# Patient Record
Sex: Female | Born: 1970
Health system: Southern US, Community
[De-identification: ages and names within clinical notes are randomized; demographics above are authoritative.]

## PROBLEM LIST (undated history)

## (undated) ENCOUNTER — Inpatient Hospital Stay: Admission: EM | Payer: Self-pay | Source: Home / Self Care

## (undated) DIAGNOSIS — M797 Fibromyalgia: Secondary | ICD-10-CM

## (undated) DIAGNOSIS — F419 Anxiety disorder, unspecified: Secondary | ICD-10-CM

## (undated) DIAGNOSIS — J42 Unspecified chronic bronchitis: Secondary | ICD-10-CM

## (undated) DIAGNOSIS — B0223 Postherpetic polyneuropathy: Secondary | ICD-10-CM

## (undated) DIAGNOSIS — T783XXA Angioneurotic edema, initial encounter: Secondary | ICD-10-CM

## (undated) DIAGNOSIS — F4310 Post-traumatic stress disorder, unspecified: Secondary | ICD-10-CM

## (undated) DIAGNOSIS — F319 Bipolar disorder, unspecified: Secondary | ICD-10-CM

## (undated) DIAGNOSIS — E162 Hypoglycemia, unspecified: Secondary | ICD-10-CM

## (undated) DIAGNOSIS — E119 Type 2 diabetes mellitus without complications: Secondary | ICD-10-CM

## (undated) DIAGNOSIS — N3010 Interstitial cystitis (chronic) without hematuria: Secondary | ICD-10-CM

## (undated) DIAGNOSIS — J449 Chronic obstructive pulmonary disease, unspecified: Secondary | ICD-10-CM

## (undated) DIAGNOSIS — F250 Schizoaffective disorder, bipolar type: Secondary | ICD-10-CM

## (undated) DIAGNOSIS — M069 Rheumatoid arthritis, unspecified: Secondary | ICD-10-CM

## (undated) DIAGNOSIS — M1990 Unspecified osteoarthritis, unspecified site: Secondary | ICD-10-CM

## (undated) DIAGNOSIS — F429 Obsessive-compulsive disorder, unspecified: Secondary | ICD-10-CM

## (undated) DIAGNOSIS — E282 Polycystic ovarian syndrome: Secondary | ICD-10-CM

## (undated) DIAGNOSIS — I639 Cerebral infarction, unspecified: Secondary | ICD-10-CM

## (undated) DIAGNOSIS — F603 Borderline personality disorder: Secondary | ICD-10-CM

## (undated) DIAGNOSIS — N644 Mastodynia: Secondary | ICD-10-CM

## (undated) HISTORY — DX: Borderline personality disorder: F60.3

## (undated) HISTORY — DX: Rheumatoid arthritis, unspecified: M06.9

## (undated) HISTORY — DX: Anxiety disorder, unspecified: F41.9

## (undated) HISTORY — DX: Unspecified osteoarthritis, unspecified site: M19.90

## (undated) HISTORY — DX: Schizoaffective disorder, bipolar type: F25.0

## (undated) HISTORY — DX: Post-traumatic stress disorder, unspecified: F43.10

## (undated) HISTORY — DX: Cerebral infarction, unspecified: I63.9

## (undated) HISTORY — DX: Hypoglycemia, unspecified: E16.2

## (undated) HISTORY — DX: Obsessive-compulsive disorder, unspecified: F42.9

## (undated) HISTORY — DX: Postherpetic polyneuropathy: B02.23

## (undated) HISTORY — DX: Angioneurotic edema, initial encounter: T78.3XXA

## (undated) HISTORY — DX: Bipolar disorder, unspecified: F31.9

## (undated) HISTORY — DX: Chronic obstructive pulmonary disease, unspecified: J44.9

## (undated) HISTORY — DX: Unspecified chronic bronchitis: J42

---
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
## 1993-09-07 HISTORY — PX: OOPHORECTOMY: SHX86

## 1997-06-07 HISTORY — PX: ABDOMINAL HYSTERECTOMY: SHX81

## 2009-11-06 ENCOUNTER — Emergency Department (HOSPITAL_COMMUNITY): Admission: EM | Admit: 2009-11-06 | Discharge: 2009-11-06 | Payer: Self-pay | Admitting: Emergency Medicine

## 2009-11-06 ENCOUNTER — Emergency Department (HOSPITAL_COMMUNITY): Admission: EM | Admit: 2009-11-06 | Discharge: 2009-11-06 | Payer: Self-pay | Admitting: Family Medicine

## 2009-11-08 ENCOUNTER — Emergency Department (HOSPITAL_COMMUNITY): Admission: EM | Admit: 2009-11-08 | Discharge: 2009-11-08 | Payer: Self-pay | Admitting: Emergency Medicine

## 2009-12-18 ENCOUNTER — Ambulatory Visit: Payer: Self-pay | Admitting: Pain Medicine

## 2010-01-19 ENCOUNTER — Emergency Department (HOSPITAL_COMMUNITY)
Admission: EM | Admit: 2010-01-19 | Discharge: 2010-01-19 | Payer: Self-pay | Source: Home / Self Care | Admitting: Emergency Medicine

## 2010-01-20 ENCOUNTER — Emergency Department (HOSPITAL_COMMUNITY): Admission: EM | Admit: 2010-01-20 | Discharge: 2010-01-20 | Payer: Self-pay | Admitting: Emergency Medicine

## 2010-02-17 ENCOUNTER — Emergency Department: Payer: Self-pay | Admitting: Emergency Medicine

## 2010-05-27 ENCOUNTER — Inpatient Hospital Stay: Payer: Self-pay | Admitting: Psychiatry

## 2010-06-06 ENCOUNTER — Emergency Department (HOSPITAL_COMMUNITY): Admission: EM | Admit: 2010-06-06 | Discharge: 2010-06-06 | Payer: Self-pay | Admitting: Emergency Medicine

## 2010-07-17 ENCOUNTER — Other Ambulatory Visit: Payer: Self-pay

## 2010-08-18 ENCOUNTER — Emergency Department: Payer: Self-pay | Admitting: Unknown Physician Specialty

## 2010-08-21 ENCOUNTER — Ambulatory Visit (HOSPITAL_COMMUNITY)
Admission: RE | Admit: 2010-08-21 | Discharge: 2010-08-21 | Payer: Self-pay | Source: Home / Self Care | Attending: Gastroenterology | Admitting: Gastroenterology

## 2010-09-28 ENCOUNTER — Encounter: Payer: Self-pay | Admitting: Gastroenterology

## 2010-11-06 HISTORY — PX: GANGLION CYST EXCISION: SHX1691

## 2010-11-20 LAB — RAPID URINE DRUG SCREEN, HOSP PERFORMED
Cocaine: NOT DETECTED
Opiates: NOT DETECTED
Tetrahydrocannabinol: NOT DETECTED

## 2010-11-20 LAB — COMPREHENSIVE METABOLIC PANEL
ALT: 55 U/L — ABNORMAL HIGH (ref 0–35)
AST: 36 U/L (ref 0–37)
Albumin: 3.7 g/dL (ref 3.5–5.2)
CO2: 26 mEq/L (ref 19–32)
Chloride: 109 mEq/L (ref 96–112)
GFR calc Af Amer: >60 mL/min (ref >60)
GFR calc non Af Amer: >60 mL/min (ref >60)
Potassium: 3.9 mEq/L (ref 3.5–5.1)
Sodium: 141 mEq/L (ref 135–145)
Total Bilirubin: 0.4 mg/dL (ref 0.3–1.2)

## 2010-11-20 LAB — DIFFERENTIAL
Basophils Absolute: 0 10*3/uL (ref 0.0–0.1)
Basophils Relative: 0 % (ref 0–1)
Eosinophils Absolute: 0.3 10*3/uL (ref 0.0–0.7)
Eosinophils Relative: 5 % (ref 0–5)
Lymphocytes Relative: 48 % — ABNORMAL HIGH (ref 12–46)
Lymphs Abs: 3.2 10*3/uL (ref 0.7–4.0)
Monocytes Absolute: 0.6 10*3/uL (ref 0.1–1.0)
Monocytes Relative: 9 % (ref 3–12)
Neutro Abs: 2.6 10*3/uL (ref 1.7–7.7)
Neutrophils Relative %: 38 % — ABNORMAL LOW (ref 43–77)

## 2010-11-20 LAB — CBC
HCT: 42.1 % (ref 36.0–46.0)
Hemoglobin: 14.1 g/dL (ref 12.0–15.0)
MCH: 31.1 pg (ref 26.0–34.0)
MCHC: 33.5 g/dL (ref 30.0–36.0)
MCV: 92.9 fL (ref 78.0–100.0)
Platelets: 192 10*3/uL (ref 150–400)
RBC: 4.53 MIL/uL (ref 3.87–5.11)
RDW: 12.9 % (ref 11.5–15.5)
WBC: 6.7 10*3/uL (ref 4.0–10.5)

## 2010-11-20 LAB — ETHANOL: Alcohol, Ethyl (B): <5 mg/dL (ref 0–10)

## 2010-12-01 LAB — COMPREHENSIVE METABOLIC PANEL
Albumin: 4 g/dL (ref 3.5–5.2)
BUN: 8 mg/dL (ref 6–23)
Calcium: 9.2 mg/dL (ref 8.4–10.5)
Creatinine, Ser: 0.67 mg/dL (ref 0.4–1.2)
Potassium: 3.6 mEq/L (ref 3.5–5.1)
Total Protein: 8.2 g/dL (ref 6.0–8.3)

## 2010-12-01 LAB — DIFFERENTIAL
Basophils Absolute: 0 10*3/uL (ref 0.0–0.1)
Basophils Relative: 0 % (ref 0–1)
Eosinophils Absolute: 0.2 10*3/uL (ref 0.0–0.7)
Eosinophils Relative: 3 % (ref 0–5)
Lymphocytes Relative: 45 % (ref 12–46)
Lymphs Abs: 2.6 10*3/uL (ref 0.7–4.0)
Monocytes Absolute: 0.6 10*3/uL (ref 0.1–1.0)
Monocytes Absolute: 0.6 10*3/uL (ref 0.1–1.0)
Monocytes Relative: 10 % (ref 3–12)
Monocytes Relative: 9 % (ref 3–12)
Neutro Abs: 2.4 10*3/uL (ref 1.7–7.7)
Neutro Abs: 3.2 10*3/uL (ref 1.7–7.7)

## 2010-12-01 LAB — URINALYSIS, ROUTINE W REFLEX MICROSCOPIC
Bilirubin Urine: NEGATIVE
Hgb urine dipstick: NEGATIVE
Hgb urine dipstick: NEGATIVE
Specific Gravity, Urine: 1.021 (ref 1.005–1.030)
Specific Gravity, Urine: 1.022 (ref 1.005–1.030)
Urobilinogen, UA: 0.2 mg/dL (ref 0.0–1.0)
pH: 7 (ref 5.0–8.0)

## 2010-12-01 LAB — POCT URINALYSIS DIP (DEVICE)
Protein, ur: 100 mg/dL — AB
pH: 7 (ref 5.0–8.0)

## 2010-12-01 LAB — POCT I-STAT, CHEM 8
Calcium, Ion: 1.13 mmol/L (ref 1.12–1.32)
Glucose, Bld: 86 mg/dL (ref 70–99)
HCT: 45 % (ref 36.0–46.0)
Hemoglobin: 15.3 g/dL — ABNORMAL HIGH (ref 12.0–15.0)
TCO2: 24 mmol/L (ref 0–100)

## 2010-12-01 LAB — CBC
HCT: 43.9 % (ref 36.0–46.0)
HCT: 45.3 % (ref 36.0–46.0)
Hemoglobin: 15.2 g/dL — ABNORMAL HIGH (ref 12.0–15.0)
MCHC: 34.5 g/dL (ref 30.0–36.0)
MCV: 91.8 fL (ref 78.0–100.0)
MCV: 92.3 fL (ref 78.0–100.0)
Platelets: 207 10*3/uL (ref 150–400)
RBC: 4.79 MIL/uL (ref 3.87–5.11)
RDW: 12.4 % (ref 11.5–15.5)
RDW: 12.8 % (ref 11.5–15.5)

## 2010-12-01 LAB — URINE MICROSCOPIC-ADD ON

## 2010-12-01 LAB — WET PREP, GENITAL: Clue Cells Wet Prep HPF POC: NONE SEEN

## 2010-12-01 LAB — URINE CULTURE: Culture: NO GROWTH

## 2010-12-01 LAB — GC/CHLAMYDIA PROBE AMP, GENITAL
Chlamydia, DNA Probe: NEGATIVE
GC Probe Amp, Genital: NEGATIVE

## 2011-02-27 ENCOUNTER — Other Ambulatory Visit: Payer: Self-pay

## 2011-08-21 DIAGNOSIS — T783XXA Angioneurotic edema, initial encounter: Secondary | ICD-10-CM | POA: Insufficient documentation

## 2013-08-29 ENCOUNTER — Ambulatory Visit: Payer: Self-pay | Admitting: Family Medicine

## 2013-10-16 DIAGNOSIS — K769 Liver disease, unspecified: Secondary | ICD-10-CM | POA: Insufficient documentation

## 2013-12-14 ENCOUNTER — Ambulatory Visit: Payer: Self-pay

## 2014-03-06 DIAGNOSIS — J45909 Unspecified asthma, uncomplicated: Secondary | ICD-10-CM | POA: Insufficient documentation

## 2014-03-06 DIAGNOSIS — E669 Obesity, unspecified: Secondary | ICD-10-CM | POA: Insufficient documentation

## 2014-04-03 DIAGNOSIS — E785 Hyperlipidemia, unspecified: Secondary | ICD-10-CM | POA: Insufficient documentation

## 2014-04-03 DIAGNOSIS — K760 Fatty (change of) liver, not elsewhere classified: Secondary | ICD-10-CM | POA: Insufficient documentation

## 2014-04-03 DIAGNOSIS — E119 Type 2 diabetes mellitus without complications: Secondary | ICD-10-CM | POA: Insufficient documentation

## 2014-04-03 DIAGNOSIS — E039 Hypothyroidism, unspecified: Secondary | ICD-10-CM | POA: Insufficient documentation

## 2014-04-03 DIAGNOSIS — J309 Allergic rhinitis, unspecified: Secondary | ICD-10-CM | POA: Insufficient documentation

## 2014-08-01 ENCOUNTER — Ambulatory Visit: Payer: Self-pay | Admitting: Family Medicine

## 2014-08-01 DIAGNOSIS — N951 Menopausal and female climacteric states: Secondary | ICD-10-CM | POA: Insufficient documentation

## 2014-08-01 DIAGNOSIS — B372 Candidiasis of skin and nail: Secondary | ICD-10-CM | POA: Insufficient documentation

## 2015-08-17 ENCOUNTER — Emergency Department
Admission: EM | Admit: 2015-08-17 | Discharge: 2015-08-17 | Disposition: A | Discharge status: Home / Self Care | Payer: Medicare Other | Attending: Emergency Medicine | Admitting: Emergency Medicine

## 2015-08-17 ENCOUNTER — Encounter: Payer: Self-pay | Admitting: Emergency Medicine

## 2015-08-17 DIAGNOSIS — T65891A Toxic effect of other specified substances, accidental (unintentional), initial encounter: Secondary | ICD-10-CM | POA: Diagnosis not present

## 2015-08-17 DIAGNOSIS — Y998 Other external cause status: Secondary | ICD-10-CM | POA: Diagnosis not present

## 2015-08-17 DIAGNOSIS — Y9389 Activity, other specified: Secondary | ICD-10-CM | POA: Diagnosis not present

## 2015-08-17 DIAGNOSIS — T7840XA Allergy, unspecified, initial encounter: Secondary | ICD-10-CM | POA: Diagnosis present

## 2015-08-17 DIAGNOSIS — Y92512 Supermarket, store or market as the place of occurrence of the external cause: Secondary | ICD-10-CM | POA: Insufficient documentation

## 2015-08-17 HISTORY — DX: Type 2 diabetes mellitus without complications: E11.9

## 2015-08-17 HISTORY — DX: Polycystic ovarian syndrome: E28.2

## 2015-08-17 HISTORY — DX: Fibromyalgia: M79.7

## 2015-08-17 MED ORDER — METHYLPREDNISOLONE SODIUM SUCC 125 MG IJ SOLR
INTRAMUSCULAR | Status: AC
  Administered 2015-08-17: 125 mg via INTRAVENOUS
  Filled 2015-08-17: qty 2

## 2015-08-17 MED ORDER — EPINEPHRINE 0.3 MG/0.3ML IJ SOAJ: 0.3000 mg | Freq: Once | INTRAMUSCULAR | Status: DC

## 2015-08-17 MED ORDER — SODIUM CHLORIDE 0.9 % IV BOLUS (SEPSIS)
1000.0000 mL | Freq: Once | INTRAVENOUS | Status: AC
  Administered 2015-08-17: 1000 mL via INTRAVENOUS

## 2015-08-17 MED ORDER — METHYLPREDNISOLONE SODIUM SUCC 125 MG IJ SOLR
125.0000 mg | Freq: Once | INTRAMUSCULAR | Status: AC
  Administered 2015-08-17: 125 mg via INTRAVENOUS

## 2015-08-17 MED ORDER — PREDNISONE 20 MG PO TABS: 40.0000 mg | ORAL_TABLET | Freq: Every day | ORAL | Status: DC

## 2015-08-17 NOTE — Discharge Instructions (Signed)
Please seek medical attention for any high fevers, chest pain, shortness of breath, change in behavior, persistent vomiting, bloody stool or any other new or concerning symptoms. ° ° °Allergies °An allergy is an abnormal reaction to a substance by the body's defense system (immune system). Allergies can develop at any age. °WHAT CAUSES ALLERGIES? °An allergic reaction happens when the immune system mistakenly reacts to a normally harmless substance, called an allergen, as if it were harmful. The immune system releases antibodies to fight the substance. Antibodies eventually release a chemical called histamine into the bloodstream. The release of histamine is meant to protect the body from infection, but it also causes discomfort. °An allergic reaction can be triggered by: °· Eating an allergen. °· Inhaling an allergen. °· Touching an allergen. °WHAT TYPES OF ALLERGIES ARE THERE? °There are many types of allergies. Common types include: °· Seasonal allergies. People with this type of allergy are usually allergic to substances that are only present during certain seasons, such as molds and pollens. °· Food allergies. °· Drug allergies. °· Insect allergies. °· Animal dander allergies. °WHAT ARE SYMPTOMS OF ALLERGIES? °Possible allergy symptoms include: °· Swelling of the lips, face, tongue, mouth, or throat. °· Sneezing, coughing, or wheezing. °· Nasal congestion. °· Tingling in the mouth. °· Rash. °· Itching. °· Itchy, red, swollen areas of skin (hives). °· Watery eyes. °· Vomiting. °· Diarrhea. °· Dizziness. °· Lightheadedness. °· Fainting. °· Trouble breathing or swallowing. °· Chest tightness. °· Rapid heartbeat. °HOW ARE ALLERGIES DIAGNOSED? °Allergies are diagnosed with a medical and family history and one or more of the following: °· Skin tests. °· Blood tests. °· A food diary. A food diary is a record of all the foods and drinks you have in a day and of all the symptoms you experience. °· The results of an  elimination diet. An elimination diet involves eliminating foods from your diet and then adding them back in one by one to find out if a certain food causes an allergic reaction. °HOW ARE ALLERGIES TREATED? °There is no cure for allergies, but allergic reactions can be treated with medicine. Severe reactions usually need to be treated at a hospital. °HOW CAN REACTIONS BE PREVENTED? °The best way to prevent an allergic reaction is by avoiding the substance you are allergic to. Allergy shots and medicines can also help prevent reactions in some cases. People with severe allergic reactions may be able to prevent a life-threatening reaction called anaphylaxis with a medicine given right after exposure to the allergen. °  °This information is not intended to replace advice given to you by your health care provider. Make sure you discuss any questions you have with your health care provider. °  °Document Released: 11/17/2002 Document Revised: 09/14/2014 Document Reviewed: 06/05/2014 °Elsevier Interactive Patient Education ©2016 [employer] ° °

## 2015-08-17 NOTE — ED Provider Notes (Signed)
[facility] Emergency Department Provider Note   ____________________________________________  Time seen: 1715  I have reviewed the triage vital signs and the nursing notes.   HISTORY  Chief Complaint Allergic Reaction   History limited by: Not Limited   HPI Stacy Daniel is a 44 y.o. female who presents to the emergency department today because of concerns for allergic reaction. The patient states that she has allergies to multiple allergens. She states she went to [place] today and in the checkout line started having a reaction. She thinks this was due to a fellow shopper. She started having swelling to her upper back which she states is the first symptom she gets with her allergic reactions. She then took 2 oral Benadryl and gave herself an EpiPen. She feels like she is now gotten better. She states that she has been followed by an allergist.  No past medical history on file.  There are no active problems to display for this patient.   No past surgical history on file.  No current outpatient prescriptions on file.  Allergies Review of patient's allergies indicates not on file.  No family history on file.  Social History Social History  Substance Use Topics  . Smoking status: Never Smoker   . Smokeless tobacco: Not on file  . Alcohol Use: No    Review of Systems  Constitutional: Negative for fever. Cardiovascular: Negative for chest pain. Respiratory: Negative for shortness of breath. Gastrointestinal: Negative for abdominal pain, vomiting and diarrhea. Genitourinary: Negative for dysuria. Musculoskeletal: Negative for back pain. Skin: Negative for rash. Neurological: Negative for headaches, focal weakness or numbness.  10-point ROS otherwise negative.  ____________________________________________   PHYSICAL EXAM:  VITAL SIGNS: ED Triage Vitals  Enc Vitals Group     BP 08/17/15 1653 138/87 mmHg     Pulse Rate 08/17/15 1653 94      Resp 08/17/15 1653 18     Temp 08/17/15 1653 98.6 F (37 C)     Temp Source 08/17/15 1653 Oral     SpO2 08/17/15 1653 99 %     Weight 08/17/15 1653 185 lb (83.915 kg)     Height 08/17/15 1653 5\' 3"  (1.6 m)   Constitutional: Alert and oriented. Well appearing and in no distress. Eyes: Conjunctivae are normal. PERRL. Normal extraocular movements. ENT   Head: Normocephalic and atraumatic.   Nose: No congestion/rhinnorhea.   Mouth/Throat: Mucous membranes are moist.   Neck: No stridor. Hematological/Lymphatic/Immunilogical: No cervical lymphadenopathy. Cardiovascular: Normal rate, regular rhythm.  No murmurs, rubs, or gallops. Respiratory: Normal respiratory effort without tachypnea nor retractions. Breath sounds are clear and equal bilaterally. No wheezes/rales/rhonchi. Gastrointestinal: Soft and nontender. No distention. There is no CVA tenderness. Genitourinary: Deferred Musculoskeletal: Normal range of motion in all extremities. No joint effusions.  No lower extremity tenderness nor edema. Neurologic:  Normal speech and language. No gross focal neurologic deficits are appreciated.  Skin:  Skin is warm, dry and intact. No rash noted. Psychiatric: Mood and affect are normal. Speech and behavior are normal. Patient exhibits appropriate insight and judgment.  ____________________________________________    LABS (pertinent positives/negatives)  None  ____________________________________________   EKG  None  ____________________________________________    RADIOLOGY  None   ____________________________________________   PROCEDURES  Procedure(s) performed: None  Critical Care performed: No  ____________________________________________   INITIAL IMPRESSION / ASSESSMENT AND PLAN / ED COURSE  Pertinent labs & imaging results that were available during my care of the patient were reviewed by me and considered  in my medical decision making (see chart  for details).  Patient presents to the emergency department today after concern for allergic reaction. The patient did give herself an EpiPen. On presentation here patient is no distress. No obvious signs of allergic reaction. I did observe the patient here in the emergency department for a number of hours without any concerning symptoms. I will discharge home with a refill prescription for EpiPen as well as a prescription for prednisone.  ____________________________________________   FINAL CLINICAL IMPRESSION(S) / ED DIAGNOSES  Final diagnoses:  Allergic reaction, initial encounter     Nance Pear, MD 08/17/15 2127

## 2015-08-17 NOTE — ED Notes (Signed)
Pt to ED from [facility] via EMS c/o allergic reaction.  Pt states left side of tongue feeling swollen, back of neck started swelling which patient says is normal reaction for her to allergies.  Pt states hx of allergic reactions but never having being intubated.  Pt states difficulty breathing, presents with dry cough.  Pt states was checking out when someone passed by with something fragrant and patient started feeling symptoms.  Pt administered 2 PO benadryl and self injected EPI.  Pt is A&Ox4, speaking in complete and coherent sentences at this time.

## 2015-09-11 DIAGNOSIS — R2689 Other abnormalities of gait and mobility: Secondary | ICD-10-CM | POA: Diagnosis not present

## 2015-09-11 DIAGNOSIS — G4710 Hypersomnia, unspecified: Secondary | ICD-10-CM | POA: Diagnosis not present

## 2015-09-11 DIAGNOSIS — M797 Fibromyalgia: Secondary | ICD-10-CM | POA: Diagnosis not present

## 2015-09-11 DIAGNOSIS — G894 Chronic pain syndrome: Secondary | ICD-10-CM | POA: Diagnosis not present

## 2015-10-09 DIAGNOSIS — M797 Fibromyalgia: Secondary | ICD-10-CM | POA: Diagnosis not present

## 2015-10-10 ENCOUNTER — Encounter: Payer: Self-pay | Admitting: Obstetrics and Gynecology

## 2015-10-11 ENCOUNTER — Ambulatory Visit (INDEPENDENT_AMBULATORY_CARE_PROVIDER_SITE_OTHER): Payer: PPO | Admitting: Urology

## 2015-10-11 ENCOUNTER — Encounter: Payer: Self-pay | Admitting: Urology

## 2015-10-11 VITALS — BP 115/80 | HR 105 | Ht 63.0 in | Wt 212.0 lb

## 2015-10-11 DIAGNOSIS — N941 Unspecified dyspareunia: Secondary | ICD-10-CM | POA: Diagnosis not present

## 2015-10-11 DIAGNOSIS — N3010 Interstitial cystitis (chronic) without hematuria: Secondary | ICD-10-CM | POA: Diagnosis not present

## 2015-10-11 LAB — URINALYSIS, COMPLETE
Bilirubin, UA: NEGATIVE
Glucose, UA: NEGATIVE
Ketones, UA: NEGATIVE
Leukocytes, UA: NEGATIVE
NITRITE UA: NEGATIVE
PH UA: 7 (ref 5.0–7.5)
Protein, UA: NEGATIVE
RBC, UA: NEGATIVE
Specific Gravity, UA: 1.01 (ref 1.005–1.030)
UUROB: 0.2 mg/dL (ref 0.2–1.0)

## 2015-10-11 LAB — MICROSCOPIC EXAMINATION: WBC, UA: NONE SEEN /hpf (ref 0–?)

## 2015-10-11 MED ORDER — PENTOSAN POLYSULFATE SODIUM 100 MG PO CAPS: 100.0000 mg | ORAL_CAPSULE | Freq: Three times a day (TID) | ORAL | Status: DC

## 2015-10-11 NOTE — Progress Notes (Signed)
10/11/2015 10:05 AM   Stacy Daniel 1971-04-10 [id number]  Referring provider: [name], MD [address], [place] [address]  Chief Complaint  Patient presents with  . IC    New patient    HPI: 45 yo F with multiple medical/ psychiatric problems with interstitial cystitis who seek to establish care at [name].  She was dx by Dr. Elba Barman from [place] (Urologist) approximately 15 years ago.  She was having dysuria, bladder pain, and frequency.  She underwent cystoscopy, hydrodistention as part of her diagnostic work up.  Records from his office have been requested today.     She has been taking Elmiron for 15 years, 100 mg bid which has been controlling her symptoms for the most part.  She does take it on an empty stomach.  More recently, she had a GI illness and was unable to take her IC medication for several days. During this time, her symptoms flared significantly but have improved since restarting Elmeron.  She also reports that by the end of the afternoon or early evening, her medication seems to be wearing off. She is interested in potentially increasing her dose to 3 times a day. She was unaware of the IC diet.    She does have some urinary frequency and urinary leakage.  She denies stress incontinence or urge incontinence but notices that she has some moisture/wetness in her underwear each day. She is not sure where this is coming from. She does not need to wear pads or diapers and is not particularly bothered by this.  No gross hematuria.  No microscopic hematuria   She does have a history of kidney stones, last passed a kidney stone 4 years ago.  She thinks she has passed a total of 3 stones in her lifetime.    She is s/p abdominal hysterectomy/ oophorocystectomy for dysmenorrhea.  No children.  She not currently sexually active and does have pain with intercourse.    No issues with UTIs.       PMH: Past Medical History  Diagnosis Date  . Diabetes mellitus without complication ([place])   . Fibromyalgia   . PCOS (polycystic ovarian syndrome)   . Angioedema   . COPD (chronic obstructive pulmonary disease) ([place])   . Hypoglycemia   . Chronic bronchitis ([place])   . Shingles (herpes zoster) polyneuropathy   . Rheumatoid arthritis ([place])   . Osteoarthritis   . Bipolar 1 disorder ([place])   . Borderline personality disorder   . Schizoaffective disorder, bipolar type ([place])   . Anxiety   . Post traumatic stress disorder   . OCD (obsessive compulsive disorder)     Surgical History: Past Surgical History  Procedure Laterality Date  . Abdominal hysterectomy    . Oophorectomy    . Ganglion cyst excision      right wrist  . Wisdom tooth extraction      Home Medications:    Medication List       This list is accurate as of: 10/11/15 11:59 PM.  Always use your most recent med list.               albuterol 108 (90 Base) MCG/ACT inhaler  Commonly known as:  PROVENTIL HFA;VENTOLIN HFA  Inhale 2 puffs into the lungs every 6 (six) hours as needed for wheezing or shortness of breath.     EPINEPHrine 0.3 mg/0.3 mL Soaj injection  Commonly known as:  EPI-PEN  Inject 0.3 mLs (0.3 mg total) into the muscle once.     estradiol 1 MG tablet  Commonly known as:  ESTRACE  Take 1 mg by mouth daily.     gabapentin 300 MG capsule  Commonly known as:  NEURONTIN  Take 300 mg by mouth 4 (four) times daily.     levocetirizine 5 MG tablet  Commonly known as:  XYZAL  Take 5 mg by mouth every evening.     levothyroxine 75 MCG tablet  Commonly known as:  SYNTHROID, LEVOTHROID  Take 75 mcg by mouth at bedtime.     LORazepam 1 MG tablet  Commonly known as:  ATIVAN  Take 1 mg by mouth 2 (two) times daily. Take morning and night.     metFORMIN 500 MG 24 hr tablet  Commonly known as:  GLUCOPHAGE-XR  Take 500 mg by mouth daily with breakfast. Take 2 tablets in the morning and 2 tablets  at night.     montelukast 10 MG tablet  Commonly known as:  SINGULAIR  Take 10 mg by mouth at bedtime.     omeprazole 20 MG capsule  Commonly known as:  PRILOSEC  Take 20 mg by mouth daily. At night.     oxyCODONE-acetaminophen 10-325 MG tablet  Commonly known as:  PERCOCET  Take by mouth.     pentosan polysulfate 100 MG capsule  Commonly known as:  ELMIRON  Take 100 mg by mouth once. Take 2 capsules at night.     pentosan polysulfate 100 MG capsule  Commonly known as:  ELMIRON  Take 1 capsule (100 mg total) by mouth 3 (three) times daily.     predniSONE 20 MG tablet  Commonly known as:  DELTASONE  Take 2 tablets (40 mg total) by mouth daily.     ranitidine 75 MG tablet  Commonly known as:  ZANTAC  Take 75 mg by mouth at bedtime.     thiothixene 2 MG capsule  Commonly known as:  NAVANE  Take 2 mg by mouth at bedtime.     tiZANidine 2 MG tablet  Commonly known as:  ZANAFLEX  Take 2 mg by mouth 3 (three) times daily. Take half tablet every 4 hours.     topiramate 100 MG tablet  Commonly known as:  TOPAMAX  Take 100 mg by mouth daily. Take 4 tablets at night.     valACYclovir 1000 MG tablet  Commonly known as:  VALTREX  Take 1,000 mg by mouth 3 (three) times daily.     venlafaxine XR 150 MG 24 hr capsule  Commonly known as:  EFFEXOR-XR  Take 150 mg by mouth at bedtime.     VIOS AEROSOL DELIVERY SYSTEM Misc  U UTD     XOPENEX HFA 45 MCG/ACT inhaler  Generic drug:  levalbuterol  INL 1 TO 2 PFS PO Q 4 H PRN        Allergies:  Allergies  Allergen Reactions  . Albuterol Anaphylaxis  . Aripiprazole Anaphylaxis  . Azelastine Anaphylaxis  . Budesonide Anaphylaxis  . Budesonide-Formoterol Fumarate Anaphylaxis  . Ciclesonide Anaphylaxis  . Fluticasone Anaphylaxis  . Fluticasone Furoate Anaphylaxis  . Fluticasone-Salmeterol Anaphylaxis  . Gadolinium Derivatives Anaphylaxis  . Hydrocodone-Acetaminophen Anaphylaxis  . Iodine Anaphylaxis    And iodide  containing products   . Ivp Dye [Iodinated Diagnostic Agents] Anaphylaxis  . Latex Anaphylaxis  . Mometasone Anaphylaxis  . Other Anaphylaxis    Petrochemical products  . Peanut-Containing Drug Products Anaphylaxis  . Penicillins  Anaphylaxis  . Petrolatum Anaphylaxis    Yellow   . Tamsulosin Anaphylaxis  . Tdap [Diphth-Acell Pertussis-Tetanus] Anaphylaxis  . Venlafaxine Other (See Comments)    Migraine, causes eye to close  . Zinc Oxide Anaphylaxis  . Albuterol Sulfate Other (See Comments)    Gi distress   . Antihistamines, Chlorpheniramine-Type Other (See Comments)    Gi distress   . Cetirizine & Related Other (See Comments)    Gi distress   . Duloxetine Other (See Comments)    Gi distress   . Fexofenadine Other (See Comments)    Gi distress   . Haloperidol And Related Other (See Comments)    Gi distress   . Ipratropium-Albuterol Other (See Comments)    Gi distress   . Levothyroxine Other (See Comments)    Gi distress   . Levothyroxine Sodium Nausea And Vomiting  . Metformin And Related Nausea And Vomiting  . Montelukast Sodium Other (See Comments)    Gi distress   . Omeprazole Nausea And Vomiting  . Sitagliptin Nausea And Vomiting  . Ziprasidone Hcl Other (See Comments)    Gi distress   . Adhesive [Tape] Hives and Rash  . Bupropion Anxiety    Family History: Family History  Problem Relation Age of Onset  . Bladder Cancer Neg Hx   . Prostate cancer Neg Hx   . Kidney cancer Neg Hx   . Breast cancer Paternal Aunt   . Diabetes Father     Social History:  reports that she has never smoked. She does not have any smokeless tobacco history on file. She reports that she does not drink alcohol or use illicit drugs.  ROS: UROLOGY Frequent Urination?: Yes Hard to postpone urination?: Yes Burning/pain with urination?: Yes Get up at night to urinate?: Yes Leakage of urine?: Yes Urine stream starts and stops?: No Trouble starting stream?: Yes Do you have  to strain to urinate?: Yes Blood in urine?: No Urinary tract infection?: Yes Sexually transmitted disease?: No Injury to kidneys or bladder?: No Painful intercourse?: Yes Weak stream?: No Currently pregnant?: No Vaginal bleeding?: No Last menstrual period?: n  Gastrointestinal Nausea?: Yes Vomiting?: No Indigestion/heartburn?: Yes Diarrhea?: No Constipation?: Yes  Constitutional Fever: No Night sweats?: Yes Weight loss?: No Fatigue?: Yes  Skin Skin rash/lesions?: Yes Itching?: Yes  Eyes Blurred vision?: Yes Double vision?: No  Ears/Nose/Throat Sore throat?: No Sinus problems?: Yes  Hematologic/Lymphatic Swollen glands?: Yes Easy bruising?: No  Cardiovascular Leg swelling?: Yes Chest pain?: No  Respiratory Cough?: No Shortness of breath?: Yes  Endocrine Excessive thirst?: Yes  Musculoskeletal Back pain?: Yes Joint pain?: Yes  Neurological Headaches?: Yes Dizziness?: No  Psychologic Depression?: Yes Anxiety?: Yes  Physical Exam: BP 115/80 mmHg  Pulse 105  Ht _0  (1.6 m)  Wt 212 lb (96.163 kg)  BMI 37.56 kg/m2  Constitutional:  Alert and oriented, No acute distress. HEENT: [place] AT, moist mucus membranes.  Trachea midline, no masses. Cardiovascular: No clubbing, cyanosis, or edema. Respiratory: Normal respiratory effort, no increased work of breathing. GI: Abdomen is soft, nontender, nondistended, no abdominal masses.  Obese.  GU: No CVA tenderness.  Skin: No rashes, bruises or suspicious lesions. Lymph: No cervical or inguinal adenopathy. Neurologic: Grossly intact, no focal deficits, moving all 4 extremities. Psychiatric: Normal mood and affect.  Laboratory Data: Comprehensive Metabolic Panel (CMP) (96/28/3662 8:19 AM) Comprehensive Metabolic Panel (CMP) (94/76/5465 8:19 AM)  Component Value Ref Range  Glucose 102 70 - 110 mg/dL  Sodium 141 136 - 145  mmol/L  Potassium 4.1 3.6 - 5.1 mmol/L  Chloride 108 97 - 109 mmol/L  Carbon  Dioxide (CO2) 24.8 22.0 - 32.0 mmol/L  Urea Nitrogen (BUN) 10 7 - 25 mg/dL  Creatinine 0.6 0.6 - 1.1 mg/dL  Glomerular Filtration Rate (eGFR), MDRD Estimate 108 >60 mL/min/1.73sq m   A1c 5.3 on 05/2015    Urinalysis Results for orders placed or performed in visit on 10/11/15  Microscopic Examination  Result Value Ref Range   WBC, UA None seen 0 -  5 /hpf   RBC, UA 0-2 0 -  2 /hpf   Epithelial Cells (non renal) 0-10 0 - 10 /hpf   Bacteria, UA Few None seen/Few  Urinalysis, Complete  Result Value Ref Range   Specific Gravity, UA 1.010 1.005 - 1.030   pH, UA 7.0 5.0 - 7.5   Color, UA Yellow Yellow   Appearance Ur Clear Clear   Leukocytes, UA Negative Negative   Protein, UA Negative Negative/Trace   Glucose, UA Negative Negative   Ketones, UA Negative Negative   RBC, UA Negative Negative   Bilirubin, UA Negative Negative   Urobilinogen, Ur 0.2 0.2 - 1.0 mg/dL   Nitrite, UA Negative Negative   Microscopic Examination See below:     Pertinent Imaging: n/a  Assessment & Plan:    1. IC (interstitial cystitis) Increase dose Elmeron to 100 mg tid  Discussed IC diet and literature given Records from Urologist in [place] requested RTC sooner if experiences IC flair - Urinalysis, Complete  2. Dyspareunia, female Discussed options for management including increased lubrication and pre-intercourse vaginal valium.  Currently not sexually active.  She will contact our office if she is interested in pursuing this in the future.  Return in about 1 year (around 10/10/2016) for f/u IC.  Hollice Espy, MD  [facility] [address], [place],  [address] [phone]

## 2015-10-12 ENCOUNTER — Encounter: Payer: Self-pay | Admitting: Urology

## 2015-10-24 DIAGNOSIS — J4520 Mild intermittent asthma, uncomplicated: Secondary | ICD-10-CM | POA: Diagnosis not present

## 2015-10-24 DIAGNOSIS — T783XXD Angioneurotic edema, subsequent encounter: Secondary | ICD-10-CM | POA: Diagnosis not present

## 2015-10-31 DIAGNOSIS — F4310 Post-traumatic stress disorder, unspecified: Secondary | ICD-10-CM | POA: Diagnosis not present

## 2015-10-31 DIAGNOSIS — F4481 Dissociative identity disorder: Secondary | ICD-10-CM | POA: Diagnosis not present

## 2015-10-31 DIAGNOSIS — F3181 Bipolar II disorder: Secondary | ICD-10-CM | POA: Diagnosis not present

## 2015-11-05 ENCOUNTER — Telehealth: Payer: Self-pay | Admitting: Gastroenterology

## 2015-11-05 ENCOUNTER — Encounter: Payer: Self-pay | Admitting: Obstetrics and Gynecology

## 2015-11-05 ENCOUNTER — Ambulatory Visit (INDEPENDENT_AMBULATORY_CARE_PROVIDER_SITE_OTHER): Payer: PPO | Admitting: Obstetrics and Gynecology

## 2015-11-05 VITALS — BP 114/68 | HR 102 | Ht 63.0 in | Wt 208.4 lb

## 2015-11-05 DIAGNOSIS — E8940 Asymptomatic postprocedural ovarian failure: Secondary | ICD-10-CM

## 2015-11-05 DIAGNOSIS — E669 Obesity, unspecified: Secondary | ICD-10-CM

## 2015-11-05 DIAGNOSIS — N3010 Interstitial cystitis (chronic) without hematuria: Secondary | ICD-10-CM | POA: Diagnosis not present

## 2015-11-05 DIAGNOSIS — E119 Type 2 diabetes mellitus without complications: Secondary | ICD-10-CM | POA: Diagnosis not present

## 2015-11-05 DIAGNOSIS — B3731 Acute candidiasis of vulva and vagina: Secondary | ICD-10-CM

## 2015-11-05 DIAGNOSIS — N958 Other specified menopausal and perimenopausal disorders: Secondary | ICD-10-CM

## 2015-11-05 DIAGNOSIS — M8580 Other specified disorders of bone density and structure, unspecified site: Secondary | ICD-10-CM | POA: Diagnosis not present

## 2015-11-05 DIAGNOSIS — N6452 Nipple discharge: Secondary | ICD-10-CM | POA: Diagnosis not present

## 2015-11-05 DIAGNOSIS — B373 Candidiasis of vulva and vagina: Secondary | ICD-10-CM | POA: Diagnosis not present

## 2015-11-05 DIAGNOSIS — E1169 Type 2 diabetes mellitus with other specified complication: Secondary | ICD-10-CM

## 2015-11-05 LAB — POCT URINALYSIS DIPSTICK
BILIRUBIN UA: NEGATIVE
GLUCOSE UA: NEGATIVE
Ketones, UA: NEGATIVE
NITRITE UA: NEGATIVE
Protein, UA: NEGATIVE
Spec Grav, UA: <=1.005
Urobilinogen, UA: 0.2
pH, UA: 7

## 2015-11-05 NOTE — Telephone Encounter (Signed)
Pain in liver and bleeding from anus. Blood on underwear

## 2015-11-05 NOTE — Telephone Encounter (Signed)
Noted! Thank you

## 2015-11-05 NOTE — Telephone Encounter (Signed)
Told patient to go to the ER since at the end of the conversation she stated she was gushing blood from her rectum like a period

## 2015-11-06 LAB — URINE CULTURE

## 2015-11-08 ENCOUNTER — Other Ambulatory Visit: Payer: Self-pay | Admitting: Family Medicine

## 2015-11-08 DIAGNOSIS — Z1231 Encounter for screening mammogram for malignant neoplasm of breast: Secondary | ICD-10-CM

## 2015-11-11 NOTE — Progress Notes (Signed)
GYN ENCOUNTER NOTE  Subjective:       Stacy Daniel is a 45 y.o. G0P0000 female is here for gynecologic evaluation of the following issues:  1. Establishment of care. 2.  History of interstitial cystitis. 3.  Chronic yeast infections.     The patient is a 45 year old female, para 0, status post TAH/BSO for dermoid cyst at age 62, on Estrace 1 mg day(, Presents for establishment of care. Patient has type 2 diabetes mellitus and has had long history of chronic yeast infections involving head and mouth and vagina. She has painful intercourse and also has interstitial cystitis with urinary frequency, urgency and bladder pain, currently being treated by Dr. Erlene Quan with [facility]. Prior to hysterectomy Patient had PCO S with hirsutism symptoms.  Gynecologic History No LMP recorded. Patient has had a hysterectomy.  Status post TAH/BSO for dermoid cyst Contraception: status post hysterectomy Last Pap: Normal No history of abnormal Pap smears. Last mammogram: 2015. Results were: normal History of osteopenia. History of right breast leakage 15 years  Obstetric History OB History  Gravida Para Term Preterm AB SAB TAB Ectopic Multiple Living  0 0 0 0 0 0 0 0 0 0         Past Medical History  Diagnosis Date  . Diabetes mellitus without complication ([place])   . Fibromyalgia   . PCOS (polycystic ovarian syndrome)   . Angioedema   . COPD (chronic obstructive pulmonary disease) ([place])   . Hypoglycemia   . Chronic bronchitis ([place])   . Shingles (herpes zoster)  polyneuropathy   . Rheumatoid arthritis ([place])   . Osteoarthritis   . Bipolar 1 disorder ([place])   . Borderline personality disorder   . Schizoaffective disorder, bipolar type ([place])   . Anxiety   . Post traumatic stress disorder   . OCD (obsessive compulsive disorder)   . Rheumatoid arthritis ([place])   . Osteoarthritis     Past Surgical History  Procedure Laterality Date  . Ganglion cyst excision      right wrist  . Wisdom tooth extraction    . Oophorectomy Right   . Abdominal hysterectomy      Current Outpatient Prescriptions on File Prior to Visit  Medication Sig  Dispense Refill  . albuterol (PROVENTIL HFA;VENTOLIN HFA) 108 (90 BASE) MCG/ACT inhaler Inhale 2 puffs into the lungs every 6 (six) hours as needed for wheezing or shortness of breath.    . EPINEPHrine 0.3 mg/0.3 mL IJ SOAJ injection Inject 0.3 mLs (0.3 mg total) into the muscle once. 1 Device 0  . estradiol (ESTRACE) 1 MG tablet Take 1 mg by mouth daily.    Marland Kitchen gabapentin (NEURONTIN) 300 MG capsule Take 300 mg by mouth 4 (four) times daily.    Marland Kitchen levocetirizine (XYZAL) 5 MG tablet Take 5 mg by mouth every evening.    Marland Kitchen levothyroxine (SYNTHROID, LEVOTHROID) 75 MCG tablet Take 75 mcg by mouth at bedtime.    Marland Kitchen LORazepam (ATIVAN) 1 MG tablet Take 1 mg by mouth 2 (two) times daily. Take morning and night.    . metFORMIN (GLUCOPHAGE-XR) 500 MG 24 hr tablet Take 500 mg by mouth daily with breakfast. Take 2 tablets in the morning and 2 tablets at night.    . montelukast (SINGULAIR) 10 MG tablet Take 10 mg by mouth at bedtime.    . Nebulizers (VIOS AEROSOL DELIVERY SYSTEM) MISC U UTD  1  . omeprazole (PRILOSEC) 20 MG capsule Take 20 mg by mouth daily. At night.    Marland Kitchen oxyCODONE-acetaminophen (PERCOCET) 10-325 MG tablet Take by mouth.    . pentosan polysulfate (ELMIRON) 100 MG capsule Take 1 capsule (100 mg total) by mouth 3 (three) times daily. 90 capsule 11  . predniSONE (DELTASONE) 20 MG tablet Take 2 tablets (40 mg total) by mouth  daily. 8 tablet 0  . ranitidine (ZANTAC) 75 MG tablet Take 75 mg by mouth at bedtime.    Marland Kitchen thiothixene (NAVANE) 2 MG capsule Take 2 mg by mouth at bedtime.    Marland Kitchen tiZANidine (ZANAFLEX) 2 MG tablet Take 2 mg by mouth 3 (three) times daily. Take half tablet every 4 hours.    . topiramate (TOPAMAX) 100 MG tablet Take 100 mg by mouth daily. Take 4 tablets at night.    . valACYclovir (VALTREX) 1000 MG tablet Take 1,000 mg by mouth 3 (three) times daily.    Marland Kitchen venlafaxine XR (EFFEXOR-XR) 150 MG 24 hr capsule Take 150 mg by mouth at bedtime.    Penne Lash HFA 45 MCG/ACT inhaler INL 1 TO 2 PFS PO Q 4 H PRN  0   No current facility-administered medications on file prior to visit.    Allergies  Allergen Reactions  . Albuterol Anaphylaxis  . Aripiprazole Anaphylaxis  . Azelastine Anaphylaxis  . Budesonide Anaphylaxis  . Budesonide-Formoterol Fumarate Anaphylaxis  . Ciclesonide Anaphylaxis  . Fluticasone Anaphylaxis  . Fluticasone Furoate Anaphylaxis  . Fluticasone-Salmeterol Anaphylaxis  . Gadolinium Derivatives Anaphylaxis  . Hydrocodone-Acetaminophen Anaphylaxis  . Iodine Anaphylaxis    And iodide containing products   . Ivp Dye [Iodinated Diagnostic Agents] Anaphylaxis  . Latex Anaphylaxis  . Mometasone Anaphylaxis  . Other Anaphylaxis    Petrochemical products  . Peanut-Containing Drug Products Anaphylaxis  . Penicillins Anaphylaxis  . Petrolatum Anaphylaxis    Yellow   . Tamsulosin Anaphylaxis  . Tdap [Diphth-Acell Pertussis-Tetanus] Anaphylaxis  . Venlafaxine Other (See Comments)    Migraine, causes eye to close  . Zinc Oxide Anaphylaxis  . Albuterol Sulfate Other (See Comments)    Gi distress   . Antihistamines, Chlorpheniramine-Type Other (See Comments)    Gi distress   . Cetirizine & Related Other (See Comments)    Gi distress   . Duloxetine Other (See  Comments)    Gi distress   . Fexofenadine Other (See Comments)    Gi distress   . Haloperidol And Related Other  (See Comments)    Gi distress   . Ipratropium-Albuterol Other (See Comments)    Gi distress   . Levothyroxine Other (See Comments)    Gi distress   . Levothyroxine Sodium Nausea And Vomiting  . Metformin And Related Nausea And Vomiting  . Montelukast Sodium Other (See Comments)    Gi distress   . Omeprazole Nausea And Vomiting  . Sitagliptin Nausea And Vomiting  . Ziprasidone Hcl Other (See Comments)    Gi distress   . Adhesive [Tape] Hives and Rash  . Bupropion Anxiety    Social History   Social History  . Marital Status: Single    Spouse Name: N/A  . Number of Children: N/A  . Years of Education: N/A   Occupational History  . Not on file.   Social History Main Topics  . Smoking status: Never Smoker   . Smokeless tobacco: Not on file  . Alcohol Use: No  . Drug Use: No  . Sexual Activity: Not Currently    Birth Control/ Protection: Surgical   Other Topics Concern  . Not on file   Social History Narrative    Family History  Problem Relation Age of Onset  . Bladder Cancer Neg Hx   . Prostate cancer Neg Hx   . Kidney cancer Neg Hx   . Ovarian cancer Neg Hx   . Colon cancer Neg Hx   . Heart disease Neg Hx   . Breast cancer Paternal Aunt   . Diabetes Father     The following portions of the patient's history were reviewed and updated as appropriate: allergies, current medications, past family history, past medical history, past social history, past surgical history and problem list.  Review of Systems History of yeast infections involving head, thrush, vagina History of right breast leakage 15 years. History of urinary frequency, urgency and dysuria, chronic  Objective:   BP 114/68 mmHg  Pulse 102  Ht 5\' 3"  (1.6 m)  Wt 208 lb 6.4 oz (94.53 kg)  BMI 36.93 kg/m2 CONSTITUTIONAL: Well-developed, well-nourished female in no acute distress.  Physical exam-deferred    Assessment:   1. IC (interstitial cystitis) - POCT urinalysis dipstick - Urine  culture 2.  Surgical menopause, asymptomatic on supplementation. 3.  Type 2 diabetes mellitus. 4.  Increased BMI. 5.  Interstitial cystitis, under treatment by Dr. Erlene Quan. 6.  Nipple discharge, undergoing workup     Plan:   1.  Continue Estrace 1 mg daily for  Surgical menopause. 2.  Recommend calcium and vitamin D supplementation and weightbearing exercise to help minimize osteopenia/osteoporosis. 3.  Optimize blood sugars and return as needed if recurrent yeast infections develop 4.  Return in 2 weeks for annual exam or as needed  A total of 30 minutes were spent face-to-face with the patient during the encounter with greater than 50% dealing with counseling and coordination of care.  Brayton Mars, MD  Note: This dictation was prepared with Dragon dictation along with smaller phrase technology. Any transcriptional errors that result from this process are unintentional.

## 2015-12-02 DIAGNOSIS — M8580 Other specified disorders of bone density and structure, unspecified site: Secondary | ICD-10-CM | POA: Insufficient documentation

## 2015-12-02 DIAGNOSIS — E8940 Asymptomatic postprocedural ovarian failure: Secondary | ICD-10-CM | POA: Insufficient documentation

## 2015-12-02 DIAGNOSIS — B3731 Acute candidiasis of vulva and vagina: Secondary | ICD-10-CM | POA: Insufficient documentation

## 2015-12-02 DIAGNOSIS — N3010 Interstitial cystitis (chronic) without hematuria: Secondary | ICD-10-CM | POA: Insufficient documentation

## 2015-12-02 DIAGNOSIS — E669 Obesity, unspecified: Secondary | ICD-10-CM

## 2015-12-02 DIAGNOSIS — B373 Candidiasis of vulva and vagina: Secondary | ICD-10-CM | POA: Insufficient documentation

## 2015-12-02 DIAGNOSIS — N6452 Nipple discharge: Secondary | ICD-10-CM | POA: Insufficient documentation

## 2015-12-02 DIAGNOSIS — E1169 Type 2 diabetes mellitus with other specified complication: Secondary | ICD-10-CM | POA: Insufficient documentation

## 2015-12-02 NOTE — Patient Instructions (Signed)
1.  Continue Estrace 1 mg daily for  Surgical menopause. 2.  Recommend calcium and vitamin D supplementation and weightbearing exercise to help minimize osteopenia/osteoporosis. 3.  Optimize blood sugars and return as needed if recurrent yeast infections develop 4.  Return in 2 weeks for annual exam or as needed

## 2015-12-03 ENCOUNTER — Encounter: Payer: PPO | Admitting: Obstetrics and Gynecology

## 2015-12-11 DIAGNOSIS — J3089 Other allergic rhinitis: Secondary | ICD-10-CM | POA: Diagnosis not present

## 2015-12-11 DIAGNOSIS — M797 Fibromyalgia: Secondary | ICD-10-CM | POA: Diagnosis not present

## 2015-12-11 DIAGNOSIS — J302 Other seasonal allergic rhinitis: Secondary | ICD-10-CM | POA: Diagnosis not present

## 2015-12-11 DIAGNOSIS — J301 Allergic rhinitis due to pollen: Secondary | ICD-10-CM | POA: Diagnosis not present

## 2015-12-11 DIAGNOSIS — J342 Deviated nasal septum: Secondary | ICD-10-CM | POA: Diagnosis not present

## 2015-12-11 DIAGNOSIS — G894 Chronic pain syndrome: Secondary | ICD-10-CM | POA: Diagnosis not present

## 2015-12-13 DIAGNOSIS — M25572 Pain in left ankle and joints of left foot: Secondary | ICD-10-CM | POA: Diagnosis not present

## 2015-12-16 DIAGNOSIS — E119 Type 2 diabetes mellitus without complications: Secondary | ICD-10-CM | POA: Diagnosis not present

## 2015-12-16 DIAGNOSIS — E039 Hypothyroidism, unspecified: Secondary | ICD-10-CM | POA: Diagnosis not present

## 2015-12-16 DIAGNOSIS — E559 Vitamin D deficiency, unspecified: Secondary | ICD-10-CM | POA: Diagnosis not present

## 2015-12-16 DIAGNOSIS — E1165 Type 2 diabetes mellitus with hyperglycemia: Secondary | ICD-10-CM | POA: Diagnosis not present

## 2015-12-16 DIAGNOSIS — M810 Age-related osteoporosis without current pathological fracture: Secondary | ICD-10-CM | POA: Diagnosis not present

## 2015-12-18 DIAGNOSIS — N6012 Diffuse cystic mastopathy of left breast: Secondary | ICD-10-CM | POA: Diagnosis not present

## 2015-12-18 DIAGNOSIS — J3089 Other allergic rhinitis: Secondary | ICD-10-CM | POA: Diagnosis not present

## 2015-12-18 DIAGNOSIS — J4530 Mild persistent asthma, uncomplicated: Secondary | ICD-10-CM | POA: Diagnosis not present

## 2015-12-18 DIAGNOSIS — E7800 Pure hypercholesterolemia, unspecified: Secondary | ICD-10-CM | POA: Diagnosis not present

## 2015-12-18 DIAGNOSIS — N644 Mastodynia: Secondary | ICD-10-CM | POA: Diagnosis not present

## 2015-12-18 DIAGNOSIS — N6011 Diffuse cystic mastopathy of right breast: Secondary | ICD-10-CM | POA: Diagnosis not present

## 2015-12-18 DIAGNOSIS — E119 Type 2 diabetes mellitus without complications: Secondary | ICD-10-CM | POA: Diagnosis not present

## 2015-12-18 DIAGNOSIS — E039 Hypothyroidism, unspecified: Secondary | ICD-10-CM | POA: Diagnosis not present

## 2015-12-18 DIAGNOSIS — K760 Fatty (change of) liver, not elsewhere classified: Secondary | ICD-10-CM | POA: Diagnosis not present

## 2015-12-19 DIAGNOSIS — F4310 Post-traumatic stress disorder, unspecified: Secondary | ICD-10-CM | POA: Diagnosis not present

## 2015-12-19 DIAGNOSIS — F3181 Bipolar II disorder: Secondary | ICD-10-CM | POA: Diagnosis not present

## 2015-12-19 DIAGNOSIS — F29 Unspecified psychosis not due to a substance or known physiological condition: Secondary | ICD-10-CM | POA: Diagnosis not present

## 2015-12-19 DIAGNOSIS — F4481 Dissociative identity disorder: Secondary | ICD-10-CM | POA: Diagnosis not present

## 2015-12-20 ENCOUNTER — Other Ambulatory Visit: Payer: Self-pay | Admitting: Family Medicine

## 2015-12-20 DIAGNOSIS — N644 Mastodynia: Secondary | ICD-10-CM

## 2015-12-25 ENCOUNTER — Encounter: Payer: Self-pay | Admitting: Obstetrics and Gynecology

## 2015-12-25 ENCOUNTER — Ambulatory Visit (INDEPENDENT_AMBULATORY_CARE_PROVIDER_SITE_OTHER): Payer: PPO | Admitting: Obstetrics and Gynecology

## 2015-12-25 VITALS — BP 121/84 | HR 86 | Ht 63.0 in | Wt 212.4 lb

## 2015-12-25 DIAGNOSIS — R638 Other symptoms and signs concerning food and fluid intake: Secondary | ICD-10-CM

## 2015-12-25 DIAGNOSIS — N644 Mastodynia: Secondary | ICD-10-CM

## 2015-12-25 DIAGNOSIS — N958 Other specified menopausal and perimenopausal disorders: Secondary | ICD-10-CM | POA: Diagnosis not present

## 2015-12-25 DIAGNOSIS — N952 Postmenopausal atrophic vaginitis: Secondary | ICD-10-CM | POA: Diagnosis not present

## 2015-12-25 DIAGNOSIS — M8580 Other specified disorders of bone density and structure, unspecified site: Secondary | ICD-10-CM | POA: Diagnosis not present

## 2015-12-25 DIAGNOSIS — E8940 Asymptomatic postprocedural ovarian failure: Secondary | ICD-10-CM

## 2015-12-25 DIAGNOSIS — Z01419 Encounter for gynecological examination (general) (routine) without abnormal findings: Secondary | ICD-10-CM

## 2015-12-25 MED ORDER — ESTRADIOL 1 MG PO TABS: 1.0000 mg | ORAL_TABLET | Freq: Every day | ORAL | Status: DC

## 2015-12-25 MED ORDER — ESTRADIOL 0.1 MG/GM VA CREA: 0.5000 g | TOPICAL_CREAM | VAGINAL | Status: DC

## 2015-12-25 NOTE — Addendum Note (Signed)
Addended by: Elouise Munroe on: 12/25/2015 03:59 PM   Modules accepted: Orders

## 2015-12-25 NOTE — Patient Instructions (Signed)
1. Pap smear is completed 2. Mammogram is already scheduled 3. Calcium with vitamin D supplementation is encouraged twice a day 4. Continue with healthy eating and exercise with weight loss goal of 10 pounds in 1 year 5. Return in 1 year for follow-up 6. Estradiol is refilled 1 mg a day 7. Estrace  intravaginal cream twice a week is prescribed

## 2015-12-25 NOTE — Progress Notes (Signed)
Patient ID: Stacy Daniel, female   DOB: November 25, 1970, 45 y.o.   MRN: [id number] ANNUAL PREVENTATIVE CARE GYN  ENCOUNTER NOTE  Subjective:       Stacy Daniel is a 45 y.o. G0P0000 female here for a routine annual gynecologic exam.  Current complaints: 1.  none    Patient has right breast tenderness unclear etiology; she has her mammogram and ultrasound scheduled for Monday of next week. Patient is on estradiol 1 mg a day and does not have vasomotor symptoms. She does experience vaginal dryness. She is not currently sexually active. Patient has started calcium with vitamin D supplementation due to history of osteopenia and surgical menopause   Gynecologic History No LMP recorded. Patient has had a hysterectomy. Contraception: status post hysterectomy TAH/BSO Last Pap: 2015. Results were: normal Last mammogram: 2014. Results were: normal  Obstetric History OB History  Gravida Para Term Preterm AB SAB TAB Ectopic Multiple Living  0 0 0 0 0 0 0 0 0 0         Past Medical History  Diagnosis Date  . Diabetes mellitus without complication ([place])   . Fibromyalgia   . PCOS (polycystic ovarian syndrome)   . Angioedema   . COPD (chronic obstructive pulmonary disease) ([place])   . Hypoglycemia   . Chronic bronchitis ([place])   . Shingles (herpes zoster) polyneuropathy   . Rheumatoid arthritis ([place])   . Osteoarthritis   . Bipolar 1 disorder ([place])   . Borderline personality disorder   . Schizoaffective disorder, bipolar type ([place])   . Anxiety   . Post traumatic stress disorder   . OCD (obsessive compulsive disorder)   . Rheumatoid arthritis ([place])   . Osteoarthritis     Past Surgical History  Procedure Laterality Date  . Ganglion cyst excision      right wrist  . Wisdom tooth extraction    . Oophorectomy Right   . Abdominal hysterectomy      Current Outpatient Prescriptions on File Prior to Visit  Medication Sig Dispense Refill  . albuterol (PROVENTIL HFA;VENTOLIN HFA) 108 (90  BASE) MCG/ACT inhaler Inhale 2 puffs into the lungs every 6 (six) hours as needed for wheezing or shortness of breath.    . Ascorbic Acid (VITAMIN C) 100 MG tablet Take 100 mg by mouth daily.    Marland Kitchen b complex vitamins tablet Take 1 tablet by mouth daily.    . cholecalciferol (VITAMIN D) 1000 units tablet Take 1,000 Units by mouth 2 (two) times daily.    Marland Kitchen EPINEPHrine 0.3 mg/0.3 mL IJ SOAJ injection Inject 0.3 mLs (0.3 mg total) into the muscle once. 1 Device 0  . estradiol (ESTRACE) 1 MG tablet Take 1 mg by mouth daily.    . Ferrous Sulfate (IRON) 142 (45 Fe) MG TBCR Take by mouth.    . Flaxseed, Linseed, (FLAX SEED OIL PO) Take by mouth.    . folic acid (FOLVITE) A999333 MCG tablet Take 400 mcg by mouth daily.    Marland Kitchen gabapentin (NEURONTIN) 300 MG capsule Take 300 mg by mouth 4 (four) times daily.    Marland Kitchen levocetirizine (XYZAL) 5 MG tablet Take 5 mg by mouth every evening.    Marland Kitchen levothyroxine (SYNTHROID, LEVOTHROID) 75 MCG tablet Take 75 mcg by mouth at bedtime.    Marland Kitchen LORazepam (ATIVAN) 1 MG tablet Take 1 mg by mouth 2 (two) times daily. Take morning and night.    . Magnesium 250 MG TABS Take by mouth.    . metFORMIN (GLUCOPHAGE-XR) 500 MG  24 hr tablet Take 500 mg by mouth daily with breakfast. Take 2 tablets in the morning and 2 tablets at night.    . montelukast (SINGULAIR) 10 MG tablet Take 10 mg by mouth at bedtime.    . Nebulizers (VIOS AEROSOL DELIVERY SYSTEM) MISC U UTD  1  . niacin 500 MG tablet Take 500 mg by mouth at bedtime.    Marland Kitchen omeprazole (PRILOSEC) 20 MG capsule Take 20 mg by mouth daily. At night.    Marland Kitchen oxyCODONE-acetaminophen (PERCOCET) 10-325 MG tablet Take by mouth.    . pentosan polysulfate (ELMIRON) 100 MG capsule Take 1 capsule (100 mg total) by mouth 3 (three) times daily. 90 capsule 11  . predniSONE (DELTASONE) 20 MG tablet Take 2 tablets (40 mg total) by mouth daily. 8 tablet 0  . ranitidine (ZANTAC) 75 MG tablet Take 75 mg by mouth at bedtime.    . Saccharomyces boulardii  (PROBIOTIC) 250 MG CAPS Take by mouth.    Marland Kitchen tiZANidine (ZANAFLEX) 2 MG tablet Take 2 mg by mouth 3 (three) times daily. Take half tablet every 4 hours.    . topiramate (TOPAMAX) 100 MG tablet Take 100 mg by mouth daily. Take 4 tablets at night.    . valACYclovir (VALTREX) 1000 MG tablet Take 1,000 mg by mouth 3 (three) times daily.    Marland Kitchen venlafaxine XR (EFFEXOR-XR) 150 MG 24 hr capsule Take 150 mg by mouth at bedtime.    Penne Lash HFA 45 MCG/ACT inhaler INL 1 TO 2 PFS PO Q 4 H PRN  0  . thiothixene (NAVANE) 2 MG capsule Take 2 mg by mouth at bedtime.     No current facility-administered medications on file prior to visit.    Allergies  Allergen Reactions  . Albuterol Anaphylaxis  . Aripiprazole Anaphylaxis  . Azelastine Anaphylaxis  . Budesonide Anaphylaxis  . Budesonide-Formoterol Fumarate Anaphylaxis  . Ciclesonide Anaphylaxis  . Fluticasone Anaphylaxis  . Fluticasone Furoate Anaphylaxis  . Fluticasone-Salmeterol Anaphylaxis  . Gadolinium Derivatives Anaphylaxis  . Hydrocodone-Acetaminophen Anaphylaxis  . Iodine Anaphylaxis    And iodide containing products   . Ivp Dye [Iodinated Diagnostic Agents] Anaphylaxis  . Latex Anaphylaxis  . Mometasone Anaphylaxis  . Other Anaphylaxis    Petrochemical products  . Peanut-Containing Drug Products Anaphylaxis  . Penicillins Anaphylaxis  . Petrolatum Anaphylaxis    Yellow   . Tamsulosin Anaphylaxis  . Tdap [Diphth-Acell Pertussis-Tetanus] Anaphylaxis  . Venlafaxine Other (See Comments)    Migraine, causes eye to close  . Zinc Oxide Anaphylaxis  . Albuterol Sulfate Other (See Comments)    Gi distress   . Antihistamines, Chlorpheniramine-Type Other (See Comments)    Gi distress   . Cetirizine & Related Other (See Comments)    Gi distress   . Duloxetine Other (See Comments)    Gi distress   . Fexofenadine Other (See Comments)    Gi distress   . Haloperidol And Related Other (See Comments)    Gi distress   .  Ipratropium-Albuterol Other (See Comments)    Gi distress   . Levothyroxine Other (See Comments)    Gi distress   . Levothyroxine Sodium Nausea And Vomiting  . Metformin And Related Nausea And Vomiting  . Montelukast Sodium Other (See Comments)    Gi distress   . Omeprazole Nausea And Vomiting  . Sitagliptin Nausea And Vomiting  . Ziprasidone Hcl Other (See Comments)    Gi distress   . Adhesive [Tape] Hives and Rash  . Bupropion  Anxiety    Social History   Social History  . Marital Status: Single    Spouse Name: N/A  . Number of Children: N/A  . Years of Education: N/A   Occupational History  . Not on file.   Social History Main Topics  . Smoking status: Never Smoker   . Smokeless tobacco: Not on file  . Alcohol Use: No  . Drug Use: No  . Sexual Activity: Not Currently    Birth Control/ Protection: Surgical   Other Topics Concern  . Not on file   Social History Narrative    Family History  Problem Relation Age of Onset  . Bladder Cancer Neg Hx   . Prostate cancer Neg Hx   . Kidney cancer Neg Hx   . Ovarian cancer Neg Hx   . Colon cancer Neg Hx   . Heart disease Neg Hx   . Breast cancer Paternal Aunt   . Diabetes Father     The following portions of the patient's history were reviewed and updated as appropriate: allergies, current medications, past family history, past medical history, past social history, past surgical history and problem list.  Review of Systems ROS Review of Systems - General ROS: negative for - chills, fatigue, fever, hot flashes, night sweats, weight gain or weight loss Psychological ROS: negative for - anxiety, decreased libido, depression, mood swings, physical abuse or sexual abuse Ophthalmic ROS: negative for - blurry vision, eye pain or loss of vision ENT ROS: negative for - headaches, hearing change, visual changes or vocal changes Allergy and Immunology ROS: negative for - hives, itchy/watery eyes or seasonal  allergies Hematological and Lymphatic ROS: negative for - bleeding problems, bruising, swollen lymph nodes or weight loss Endocrine ROS: negative for - galactorrhea, hair pattern changes, hot flashes, malaise/lethargy, mood swings, palpitations, polydipsia/polyuria, skin changes, temperature intolerance or unexpected weight changes Breast ROS: negative for - new or changing breast lumps or nipple discharge. POSITIVE-right breast tenderness Respiratory ROS: negative for - cough or shortness of breath Cardiovascular ROS: negative for - chest pain, irregular heartbeat, palpitations or shortness of breath Gastrointestinal ROS: no abdominal pain, change in bowel habits, or black or bloody stools Genito-Urinary ROS: no dysuria, trouble voiding, or hematuria. POSITIVE-vaginal dryness Musculoskeletal ROS: negative for - joint pain or joint stiffness Neurological ROS: negative for - bowel and bladder control changes Dermatological ROS: negative for rash and skin lesion changes   Objective:   BP 121/84 mmHg  Pulse 86  Ht 5\' 3"  (1.6 m)  Wt 212 lb 6.4 oz (96.344 kg)  BMI 37.63 kg/m2 CONSTITUTIONAL: Well-developed, well-nourished female in no acute distress.  PSYCHIATRIC: Normal mood and affect. Normal behavior. Normal judgment and thought content. [place]: Alert and oriented to person, place, and time. Normal muscle tone coordination. No cranial nerve deficit noted. HENT:  Normocephalic, atraumatic, External right and left ear normal. Oropharynx is clear and moist EYES: Conjunctivae and EOM are normal. Pupils are equal, round, and reactive to light. No scleral icterus.  NECK: Normal range of motion, supple, no masses.  Normal thyroid.  SKIN: Skin is warm and dry. No rash noted. Not diaphoretic. No erythema. No pallor. CARDIOVASCULAR: Normal heart rate noted, regular rhythm, no murmur. RESPIRATORY: Clear to auscultation bilaterally. Effort and breath sounds normal, no problems with respiration  noted. BREASTS: Symmetric in size. No masses, skin changes, nipple drainage, or lymphadenopathy. ABDOMEN: Soft, normal bowel sounds, no distention noted.  No tenderness, rebound or guarding.  BLADDER: Normal PELVIC:  External Genitalia:  Normal  BUS: Normal  Vagina: Moderate atrophy; good vault support  Cervix: Surgically absent  Uterus: Surgically absent  Adnexa: Normal  RV: External Exam NormaI, No Rectal Masses and Normal Sphincter tone  MUSCULOSKELETAL: Normal range of motion. No tenderness.  No cyanosis, clubbing, or edema. LYMPHATIC: No Axillary, Supraclavicular, or Inguinal Adenopathy.    Assessment:   Annual gynecologic examination 45 y.o. Contraception: status post hysterectomy TAH/BSO bmi- 37 Surgical menopause, asymptomatic on ERT therapy Vaginal atrophy, symptomatic Osteopenia  Plan:  Pap: Pap Co Test Mammogram: Ordered thru pc- 12/30/2015 Stool Guaiac Testing:  Not Indicated Labs: thru pcp Routine preventative health maintenance measures emphasized: Exercise/Diet/Weight control, Tobacco Warnings, Alcohol/Substance use risks and Safe Sex Estradiol 1 mg daily refills Estrace cream 1/2 g intravaginal twice weekly prescribed Return to [place], CMA  Brayton Mars, MD  Note: This dictation was prepared with Dragon dictation along with smaller phrase technology. Any transcriptional errors that result from this process are unintentional.

## 2015-12-26 LAB — PAP IG W/ RFLX HPV ASCU: PAP SMEAR COMMENT: 0

## 2015-12-30 ENCOUNTER — Other Ambulatory Visit: Payer: Medicare Other

## 2016-01-02 ENCOUNTER — Ambulatory Visit
Admission: RE | Admit: 2016-01-02 | Discharge: 2016-01-02 | Disposition: A | Discharge status: Home / Self Care | Payer: PPO | Source: Ambulatory Visit | Attending: Family Medicine | Admitting: Family Medicine

## 2016-01-02 ENCOUNTER — Other Ambulatory Visit: Payer: Self-pay | Admitting: Family Medicine

## 2016-01-02 DIAGNOSIS — N644 Mastodynia: Secondary | ICD-10-CM

## 2016-01-02 DIAGNOSIS — N63 Unspecified lump in breast: Secondary | ICD-10-CM | POA: Diagnosis not present

## 2016-01-02 DIAGNOSIS — R921 Mammographic calcification found on diagnostic imaging of breast: Secondary | ICD-10-CM

## 2016-01-06 ENCOUNTER — Other Ambulatory Visit: Payer: Self-pay | Admitting: Family Medicine

## 2016-01-06 DIAGNOSIS — R921 Mammographic calcification found on diagnostic imaging of breast: Secondary | ICD-10-CM

## 2016-01-07 ENCOUNTER — Ambulatory Visit
Admission: RE | Admit: 2016-01-07 | Discharge: 2016-01-07 | Disposition: A | Discharge status: Home / Self Care | Payer: PPO | Source: Ambulatory Visit | Attending: Family Medicine | Admitting: Family Medicine

## 2016-01-07 DIAGNOSIS — R921 Mammographic calcification found on diagnostic imaging of breast: Secondary | ICD-10-CM | POA: Diagnosis not present

## 2016-01-07 DIAGNOSIS — N6011 Diffuse cystic mastopathy of right breast: Secondary | ICD-10-CM | POA: Diagnosis not present

## 2016-01-13 DIAGNOSIS — E119 Type 2 diabetes mellitus without complications: Secondary | ICD-10-CM | POA: Diagnosis not present

## 2016-01-13 DIAGNOSIS — M659 Synovitis and tenosynovitis, unspecified: Secondary | ICD-10-CM | POA: Diagnosis not present

## 2016-01-28 ENCOUNTER — Telehealth: Payer: Self-pay | Admitting: Urology

## 2016-01-28 DIAGNOSIS — F4481 Dissociative identity disorder: Secondary | ICD-10-CM | POA: Diagnosis not present

## 2016-01-28 DIAGNOSIS — F3181 Bipolar II disorder: Secondary | ICD-10-CM | POA: Diagnosis not present

## 2016-01-28 DIAGNOSIS — F29 Unspecified psychosis not due to a substance or known physiological condition: Secondary | ICD-10-CM | POA: Diagnosis not present

## 2016-01-28 DIAGNOSIS — F4310 Post-traumatic stress disorder, unspecified: Secondary | ICD-10-CM | POA: Diagnosis not present

## 2016-01-28 NOTE — Telephone Encounter (Signed)
FYI  Requested records from Dr. Lorriane Shire On 10-08-15 and 10-11-15 and 10-24-15 and i called and spoke with Amy in medical record at [facility] [phone] and she said she would make sure they faxed them on 10-11-15 and they still never got faxed.   Sharyn Lull

## 2016-02-05 DIAGNOSIS — G894 Chronic pain syndrome: Secondary | ICD-10-CM | POA: Diagnosis not present

## 2016-02-05 DIAGNOSIS — M797 Fibromyalgia: Secondary | ICD-10-CM | POA: Diagnosis not present

## 2016-02-05 DIAGNOSIS — M064 Inflammatory polyarthropathy: Secondary | ICD-10-CM | POA: Diagnosis not present

## 2016-02-07 DIAGNOSIS — R42 Dizziness and giddiness: Secondary | ICD-10-CM | POA: Insufficient documentation

## 2016-02-07 DIAGNOSIS — R0602 Shortness of breath: Secondary | ICD-10-CM | POA: Insufficient documentation

## 2016-02-07 DIAGNOSIS — R002 Palpitations: Secondary | ICD-10-CM | POA: Diagnosis not present

## 2016-02-07 DIAGNOSIS — E7800 Pure hypercholesterolemia, unspecified: Secondary | ICD-10-CM | POA: Diagnosis not present

## 2016-02-14 ENCOUNTER — Emergency Department (HOSPITAL_COMMUNITY)
Admission: EM | Admit: 2016-02-14 | Discharge: 2016-02-14 | Disposition: A | Discharge status: Home / Self Care | Payer: PPO | Attending: Emergency Medicine | Admitting: Emergency Medicine

## 2016-02-14 ENCOUNTER — Encounter (HOSPITAL_COMMUNITY): Payer: Self-pay | Admitting: Emergency Medicine

## 2016-02-14 ENCOUNTER — Emergency Department (HOSPITAL_COMMUNITY): Payer: PPO

## 2016-02-14 DIAGNOSIS — F319 Bipolar disorder, unspecified: Secondary | ICD-10-CM | POA: Diagnosis not present

## 2016-02-14 DIAGNOSIS — Z8742 Personal history of other diseases of the female genital tract: Secondary | ICD-10-CM | POA: Insufficient documentation

## 2016-02-14 DIAGNOSIS — Z880 Allergy status to penicillin: Secondary | ICD-10-CM | POA: Diagnosis not present

## 2016-02-14 DIAGNOSIS — Z87448 Personal history of other diseases of urinary system: Secondary | ICD-10-CM | POA: Insufficient documentation

## 2016-02-14 DIAGNOSIS — R531 Weakness: Secondary | ICD-10-CM | POA: Insufficient documentation

## 2016-02-14 DIAGNOSIS — J449 Chronic obstructive pulmonary disease, unspecified: Secondary | ICD-10-CM | POA: Diagnosis not present

## 2016-02-14 DIAGNOSIS — R202 Paresthesia of skin: Secondary | ICD-10-CM | POA: Diagnosis not present

## 2016-02-14 DIAGNOSIS — M6281 Muscle weakness (generalized): Secondary | ICD-10-CM | POA: Diagnosis not present

## 2016-02-14 DIAGNOSIS — R079 Chest pain, unspecified: Secondary | ICD-10-CM | POA: Insufficient documentation

## 2016-02-14 DIAGNOSIS — I639 Cerebral infarction, unspecified: Secondary | ICD-10-CM

## 2016-02-14 DIAGNOSIS — R002 Palpitations: Secondary | ICD-10-CM | POA: Diagnosis not present

## 2016-02-14 DIAGNOSIS — E119 Type 2 diabetes mellitus without complications: Secondary | ICD-10-CM | POA: Diagnosis not present

## 2016-02-14 DIAGNOSIS — R0602 Shortness of breath: Secondary | ICD-10-CM | POA: Diagnosis not present

## 2016-02-14 DIAGNOSIS — R55 Syncope and collapse: Secondary | ICD-10-CM | POA: Diagnosis not present

## 2016-02-14 DIAGNOSIS — R42 Dizziness and giddiness: Secondary | ICD-10-CM | POA: Diagnosis not present

## 2016-02-14 DIAGNOSIS — Z8619 Personal history of other infectious and parasitic diseases: Secondary | ICD-10-CM | POA: Diagnosis not present

## 2016-02-14 DIAGNOSIS — F250 Schizoaffective disorder, bipolar type: Secondary | ICD-10-CM | POA: Diagnosis not present

## 2016-02-14 DIAGNOSIS — M069 Rheumatoid arthritis, unspecified: Secondary | ICD-10-CM | POA: Insufficient documentation

## 2016-02-14 DIAGNOSIS — Z79899 Other long term (current) drug therapy: Secondary | ICD-10-CM | POA: Diagnosis not present

## 2016-02-14 DIAGNOSIS — M797 Fibromyalgia: Secondary | ICD-10-CM | POA: Insufficient documentation

## 2016-02-14 HISTORY — DX: Mastodynia: N64.4

## 2016-02-14 HISTORY — DX: Interstitial cystitis (chronic) without hematuria: N30.10

## 2016-02-14 HISTORY — DX: Cerebral infarction, unspecified: I63.9

## 2016-02-14 LAB — BASIC METABOLIC PANEL
Anion gap: 8 (ref 5–15)
BUN: 5 mg/dL — AB (ref 6–20)
CALCIUM: 9.3 mg/dL (ref 8.9–10.3)
CHLORIDE: 109 mmol/L (ref 101–111)
CO2: 23 mmol/L (ref 22–32)
CREATININE: 0.57 mg/dL (ref 0.44–1.00)
GFR calc non Af Amer: >60 mL/min (ref >60)
GLUCOSE: 80 mg/dL (ref 65–99)
Potassium: 3.7 mmol/L (ref 3.5–5.1)
Sodium: 140 mmol/L (ref 135–145)

## 2016-02-14 LAB — PROTIME-INR
INR: 1 (ref 0.00–1.49)
Prothrombin Time: 13.4 seconds (ref 11.6–15.2)

## 2016-02-14 LAB — CBC
HCT: 45.1 % (ref 36.0–46.0)
Hemoglobin: 14.6 g/dL (ref 12.0–15.0)
MCH: 29.2 pg (ref 26.0–34.0)
MCHC: 32.4 g/dL (ref 30.0–36.0)
MCV: 90.2 fL (ref 78.0–100.0)
PLATELETS: 248 10*3/uL (ref 150–400)
RBC: 5 MIL/uL (ref 3.87–5.11)
RDW: 12.9 % (ref 11.5–15.5)
WBC: 7.8 10*3/uL (ref 4.0–10.5)

## 2016-02-14 LAB — CBG MONITORING, ED: GLUCOSE-CAPILLARY: 76 mg/dL (ref 65–99)

## 2016-02-14 LAB — APTT: APTT: 28 s (ref 24–37)

## 2016-02-14 LAB — I-STAT TROPONIN, ED: TROPONIN I, POC: 0 ng/mL (ref 0.00–0.08)

## 2016-02-14 NOTE — Code Documentation (Signed)
45yo female arriving to [facility] via private vehicle at [phone].  Patient reports dizziness that started yesterday and has continued today.  She reports at 0900 this morning she had right sided weakness while walking out to her car.  Patient presented to the ED with c/o chest and abdominal pain.  Code stroke was called for c/o dizziness and right sided weakness.  Patient also reporting blurred vision and color distortion.  Patient with h/o DM and fibromyalgia.  Patient to CT.  Stroke team to the bedside.  NIHSS 3, see documentation for details and code stroke times.  Patient with right arm and leg drift and decreased sensation in the right face, arm and leg on exam.  Patient reporting pain in her right underarm as well.  Patient is outside the window for treatment with tPA.Marland Kitchen  Plan for MRI.  Bedside handoff with ED RN Melissa.

## 2016-02-14 NOTE — ED Notes (Signed)
Pt ambulatory to restroom with steady gait.

## 2016-02-14 NOTE — Consult Note (Addendum)
Requesting Physician: Dr. Thurnell Garbe    Chief Complaint: right arm and leg weakness  History obtained from:  Patient    HPI:                                                                                                                                         Stacy Daniel is an 45 y.o. female who noted she was dizzy yesterday to the point she would not drive. She noted this AM that in addition to her dizziness sh had decreased sensation to there right arm and leg along with weakness. She is also noting abdominal discomfort some chest discomfort. On the way to CT scan she noted the colors in her vision were off. Currently she has a slight drift in her right arm and leg along with decreased sensation in those limbs.   Date last known well: 6.8.2017 Time last known well: Unable to determine tPA Given: No: out of window   Past Medical History  Diagnosis Date  . Diabetes mellitus without complication ([place])   . Fibromyalgia   . PCOS (polycystic ovarian syndrome)   . Angioedema   . COPD (chronic obstructive pulmonary disease) ([place])   . Hypoglycemia   . Chronic bronchitis ([place])   . Shingles (herpes zoster) polyneuropathy   . Rheumatoid arthritis ([place])   . Osteoarthritis   . Bipolar 1 disorder ([place])   . Borderline personality disorder   . Schizoaffective disorder, bipolar type ([place])   . Anxiety   . Post traumatic stress disorder   . OCD (obsessive compulsive disorder)   . Rheumatoid arthritis ([place])   . Osteoarthritis   . Breast pain in female     chronic, right  . IC (interstitial cystitis)     Past Surgical History  Procedure Laterality Date  . Ganglion cyst excision      right wrist  . Wisdom tooth extraction    . Oophorectomy Right   . Abdominal hysterectomy      Family History  Problem Relation Age of Onset  . Bladder Cancer Neg Hx   . Prostate cancer Neg Hx   . Kidney cancer Neg Hx   . Ovarian cancer Neg Hx   . Colon cancer Neg Hx   . Heart disease Neg Hx   .  Breast cancer Paternal Aunt   . Diabetes Father    Social History:  reports that she has never smoked. She does not have any smokeless tobacco history on file. She reports that she does not drink alcohol or use illicit drugs.  Allergies:  Allergies  Allergen Reactions  . Albuterol Anaphylaxis  . Aripiprazole Anaphylaxis  . Azelastine Anaphylaxis  . Budesonide Anaphylaxis  . Budesonide-Formoterol Fumarate Anaphylaxis  . Ciclesonide Anaphylaxis  . Fluticasone Anaphylaxis  . Fluticasone Furoate Anaphylaxis  . Fluticasone-Salmeterol Anaphylaxis  . Gadolinium Derivatives Anaphylaxis  . Hydrocodone-Acetaminophen Anaphylaxis  . Iodine Anaphylaxis  And iodide containing products   . Ivp Dye [Iodinated Diagnostic Agents] Anaphylaxis  . Latex Anaphylaxis  . Mometasone Anaphylaxis  . Other Anaphylaxis    Petrochemical products  . Peanut-Containing Drug Products Anaphylaxis  . Penicillins Anaphylaxis  . Petrolatum Anaphylaxis    Yellow   . Tamsulosin Anaphylaxis  . Tdap [Diphth-Acell Pertussis-Tetanus] Anaphylaxis  . Venlafaxine Other (See Comments)    Migraine, causes eye to close  . Zinc Oxide Anaphylaxis  . Albuterol Sulfate Other (See Comments)    Gi distress   . Antihistamines, Chlorpheniramine-Type Other (See Comments)    Gi distress   . Cetirizine & Related Other (See Comments)    Gi distress   . Duloxetine Other (See Comments)    Gi distress   . Fexofenadine Other (See Comments)    Gi distress   . Haloperidol And Related Other (See Comments)    Gi distress   . Ipratropium-Albuterol Other (See Comments)    Gi distress   . Levothyroxine Other (See Comments)    Gi distress   . Levothyroxine Sodium Nausea And Vomiting  . Metformin And Related Nausea And Vomiting  . Montelukast Sodium Other (See Comments)    Gi distress   . Omeprazole Nausea And Vomiting  . Sitagliptin Nausea And Vomiting  . Ziprasidone Hcl Other (See Comments)    Gi distress   .  Adhesive [Tape] Hives and Rash  . Bupropion Anxiety    Medications:                                                                                                                           No current facility-administered medications for this encounter.   Current Outpatient Prescriptions  Medication Sig Dispense Refill  . albuterol (PROVENTIL HFA;VENTOLIN HFA) 108 (90 BASE) MCG/ACT inhaler Inhale 2 puffs into the lungs every 6 (six) hours as needed for wheezing or shortness of breath.    . Ascorbic Acid (VITAMIN C) 100 MG tablet Take 100 mg by mouth daily.    Marland Kitchen b complex vitamins tablet Take 1 tablet by mouth daily.    . cholecalciferol (VITAMIN D) 1000 units tablet Take 1,000 Units by mouth 2 (two) times daily.    Marland Kitchen EPINEPHrine 0.3 mg/0.3 mL IJ SOAJ injection Inject 0.3 mLs (0.3 mg total) into the muscle once. 1 Device 0  . estradiol (ESTRACE VAGINAL) 0.1 MG/GM vaginal cream Place 0.5 g vaginally 2 (two) times a week. 1/2 g twice a week intravaginal 42.5 g 2  . estradiol (ESTRACE) 1 MG tablet Take 1 tablet (1 mg total) by mouth daily. 90 tablet 3  . Ferrous Sulfate (IRON) 142 (45 Fe) MG TBCR Take by mouth.    . Flaxseed, Linseed, (FLAX SEED OIL PO) Take by mouth.    . folic acid (FOLVITE) A999333 MCG tablet Take 400 mcg by mouth daily.    Marland Kitchen gabapentin (NEURONTIN) 300 MG capsule Take 300 mg by mouth 4 (four) times daily.    Marland Kitchen  ketoconazole (NIZORAL) 2 % shampoo     . levocetirizine (XYZAL) 5 MG tablet Take 5 mg by mouth every evening.    Marland Kitchen levothyroxine (SYNTHROID, LEVOTHROID) 75 MCG tablet Take 75 mcg by mouth at bedtime.    Marland Kitchen LORazepam (ATIVAN) 1 MG tablet Take 1 mg by mouth 2 (two) times daily. Take morning and night.    . Magnesium 250 MG TABS Take by mouth.    . metFORMIN (GLUCOPHAGE-XR) 500 MG 24 hr tablet Take 500 mg by mouth daily with breakfast. Take 2 tablets in the morning and 2 tablets at night.    . montelukast (SINGULAIR) 10 MG tablet Take 10 mg by mouth at bedtime.    .  Nebulizers (VIOS AEROSOL DELIVERY SYSTEM) MISC U UTD  1  . niacin 500 MG tablet Take 500 mg by mouth at bedtime.    Marland Kitchen omeprazole (PRILOSEC) 20 MG capsule Take 20 mg by mouth daily. At night.    . ONE TOUCH ULTRA TEST test strip USE TO CHECK BLOOD SUGARS 2 TO 3 TIMES A DAY UTD FOR 90 DAYS  3  . oxyCODONE-acetaminophen (PERCOCET) 10-325 MG tablet Take by mouth.    . pentosan polysulfate (ELMIRON) 100 MG capsule Take 1 capsule (100 mg total) by mouth 3 (three) times daily. 90 capsule 11  . predniSONE (DELTASONE) 20 MG tablet Take 2 tablets (40 mg total) by mouth daily. 8 tablet 0  . ranitidine (ZANTAC) 75 MG tablet Take 75 mg by mouth at bedtime.    . Saccharomyces boulardii (PROBIOTIC) 250 MG CAPS Take by mouth.    . thiothixene (NAVANE) 2 MG capsule Take 2 mg by mouth at bedtime.    Marland Kitchen tiZANidine (ZANAFLEX) 2 MG tablet Take 2 mg by mouth 3 (three) times daily. Take half tablet every 4 hours.    . topiramate (TOPAMAX) 100 MG tablet Take 100 mg by mouth daily. Take 4 tablets at night.    . valACYclovir (VALTREX) 1000 MG tablet Take 1,000 mg by mouth 3 (three) times daily.    Marland Kitchen venlafaxine XR (EFFEXOR-XR) 150 MG 24 hr capsule Take 150 mg by mouth at bedtime.    Penne Lash HFA 45 MCG/ACT inhaler INL 1 TO 2 PFS PO Q 4 H PRN  0     ROS:                                                                                                                                       History obtained from the patient  General ROS: negative for - chills, fatigue, fever, night sweats, weight gain or weight loss Psychological ROS: negative for - behavioral disorder, hallucinations, memory difficulties, mood swings or suicidal ideation Ophthalmic ROS: negative for - blurry vision, double vision, eye pain or loss of vision ENT ROS: negative for - epistaxis, nasal discharge, oral lesions, sore throat, tinnitus or vertigo Allergy and Immunology ROS: negative for -  hives or itchy/watery eyes Hematological and Lymphatic  ROS: negative for - bleeding problems, bruising or swollen lymph nodes Endocrine ROS: negative for - galactorrhea, hair pattern changes, polydipsia/polyuria or temperature intolerance Respiratory ROS: negative for - cough, hemoptysis, shortness of breath or wheezing Cardiovascular ROS: negative for - chest pain, dyspnea on exertion, edema or irregular heartbeat Gastrointestinal ROS: negative for - abdominal pain, diarrhea, hematemesis, nausea/vomiting or stool incontinence Genito-Urinary ROS: negative for - dysuria, hematuria, incontinence or urinary frequency/urgency Musculoskeletal ROS: negative for - joint swelling or muscular weakness Neurological ROS: as noted in HPI Dermatological ROS: negative for rash and skin lesion changes  Neurologic Examination:                                                                                                      Blood pressure 115/82, pulse 90, temperature 97.6 F (36.4 C), temperature source Oral, resp. rate 18, SpO2 96 %.  HEENT-  Normocephalic, no lesions, without obvious abnormality.  Normal external eye and conjunctiva.  Normal TM's bilaterally.  Normal auditory canals and external ears. Normal external nose, mucus membranes and septum.  Normal pharynx. Cardiovascular- S1, S2 normal, pulses palpable throughout   Lungs- chest clear, no wheezing, rales, normal symmetric air entry Abdomen- normal findings: bowel sounds normal Extremities- no edema Lymph-no adenopathy palpable Musculoskeletal-no joint tenderness, deformity or swelling Skin-warm and dry, no hyperpigmentation, vitiligo, or suspicious lesions  Neurological Examination Mental Status: Alert, oriented, thought content appropriate.  Speech fluent without evidence of aphasia.  Able to follow 3 step commands without difficulty. Cranial Nerves: II:  Visual fields grossly normal, pupils equal, round, reactive to light and accommodation III,IV, VI: ptosis not present, extra-ocular motions  intact bilaterally V,VII: smile symmetric, facial light touch sensation decreased on the right  VIII: hearing normal bilaterally IX,X: uvula rises symmetrically XI: bilateral shoulder shrug XII: midline tongue extension Motor: Right : Upper extremity   4+/5    Left:     Upper extremity   5/5  Lower extremity   4+/5     Lower extremity   5/5 Tone and bulk:normal tone throughout; no atrophy noted Sensory: Pinprick and light touch decreased on the right Deep Tendon Reflexes: 1+ and symmetric throughout Plantars: Right: downgoing   Left: downgoing Cerebellar: normal finger-to-nose, normal H-S appropriate for weakness Gait: not tested       Lab Results: Basic Metabolic Panel:  Recent Labs Lab 02/14/16 1029  NA 140  K 3.7  CL 109  CO2 23  GLUCOSE 80  BUN 5*  CREATININE 0.57  CALCIUM 9.3    Liver Function Tests: No results for input(s): AST, ALT, ALKPHOS, BILITOT, PROT, ALBUMIN in the last 168 hours. No results for input(s): LIPASE, AMYLASE in the last 168 hours. No results for input(s): AMMONIA in the last 168 hours.  CBC:  Recent Labs Lab 02/14/16 1029  WBC 7.8  HGB 14.6  HCT 45.1  MCV 90.2  PLT 248    Cardiac Enzymes: No results for input(s): CKTOTAL, CKMB, CKMBINDEX, TROPONINI in the last 168 hours.  Lipid Panel: No results for  input(s): CHOL, TRIG, HDL, CHOLHDL, VLDL, LDLCALC in the last 168 hours.  CBG: No results for input(s): GLUCAP in the last 168 hours.  Microbiology: Results for orders placed or performed in visit on 11/05/15  Urine culture     Status: None   Collection Time: 11/05/15  2:26 PM  Result Value Ref Range Status   Urine Culture, Routine Final report  Final   Urine Culture result 1 Comment  Final    Comment: Mixed urogenital flora Less than 10,000 colonies/mL     Coagulation Studies: No results for input(s): LABPROT, INR in the last 72 hours.  Imaging: Dg Chest 2 View  02/14/2016  CLINICAL DATA:  Shortness of breath and  chest pain EXAM: CHEST  2 VIEW COMPARISON:  December 14, 2013 FINDINGS: Lungs are clear. Heart size and pulmonary vascularity are normal. No adenopathy. No pneumothorax. No bone lesions. IMPRESSION: No edema or consolidation. Electronically Signed   By: Lowella Grip III M.D.   On: 02/14/2016 10:21       Assessment and plan discussed with with attending physician and they are in agreement.    Etta Quill PA-C [facility] [phone]  02/14/2016, 11:47 AM   Assessment: 45 y.o. female with 1 day of dizziness followed by new onset of right sided weakness and decreased sensation. CT head without acute infarct or bleed. Possiblities include anxiety vs small ischemic lesion. Will obtain MRI brain. If positive continue with stroke work up.   Stroke Risk Factors - diabetes mellitus  1) MRI brain, if positive for stroke, then would admit for further workup 2) If negative, can follow up as outpatient.   Roland Rack, MD [facility] [phone]  If 7pm- 7am, please page neurology on call as listed in [place].  Reviewed MRI, no acute findigns. I strongly suspect MRI findings are consistent with small vessel ischemic disease due to DM and that her current presentation is more anxiety driven. She has had symptoms since yesterday, so if these were ischemic(e.g. Stroke) then I would expect to see this on MRI(e.g. Not a TIA).  I would favor having her follow up as an outpaitent as her symptoms are mild, but certainly if she continues to get worse, then she should return for further workup.   Roland Rack, MD [facility] [phone]  If 7pm- 7am, please page neurology on call as listed in [place].

## 2016-02-14 NOTE — ED Provider Notes (Signed)
CSN: [id number]     Arrival date & time 02/14/16  [id number] History   First MD Initiated Contact with Patient 02/14/16 1115     Chief Complaint  Patient presents with  . Chest Pain     (Consider location/radiation/quality/duration/timing/severity/associated sxs/prior Treatment) HPI  Blood pressure 115/82, pulse 90, temperature 97.6 F (36.4 C), temperature source Oral, resp. rate 18, SpO2 96 %.  Stacy Daniel is a 45 y.o. female past medical history significant for non-insulin-dependent diabetes, fibromyalgia, COPD, bipolar, schizoaffective, PTSD, OCD and anxiety complaining of upper abdominal and chest pain with palpitations and. Patient woke at 6 AM, states she's had difficulty finding words and right-sided weakness in upper and lower extremity with ataxia and vertigo onset yesterday. 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 She had a Holter monitor the came off after 2 hours, was scheduled to go back today to have a Holter monitor reapplied.  Past Medical History  Diagnosis Date  . Diabetes mellitus without complication ([place])   . Fibromyalgia   . PCOS (polycystic ovarian syndrome)   . Angioedema   . COPD (chronic obstructive pulmonary disease) ([place])   . Hypoglycemia   . Chronic bronchitis ([place])   . Shingles (herpes zoster) polyneuropathy   . Rheumatoid arthritis ([place])   . Osteoarthritis   . Bipolar 1 disorder ([place])   . Borderline personality disorder   . Schizoaffective disorder, bipolar type ([place])   . Anxiety   . Post traumatic stress disorder   . OCD (obsessive compulsive disorder)   . Rheumatoid arthritis ([place])   . Osteoarthritis   . Breast pain in female     chronic, right  . IC (interstitial  cystitis)    Past Surgical History  Procedure Laterality Date  . Ganglion cyst excision      right wrist  . Wisdom tooth extraction    . Oophorectomy Right   . Abdominal hysterectomy     Family History  Problem Relation Age of Onset  . Bladder Cancer Neg Hx   . Prostate cancer Neg Hx   . Kidney cancer Neg Hx   . Ovarian cancer Neg Hx   . Colon cancer Neg Hx   . Heart disease Neg Hx   . Breast cancer Paternal Aunt   . Diabetes Father    Social History  Substance Use Topics  . Smoking status: Never Smoker   . Smokeless tobacco: Not on file  . Alcohol Use: No   OB History    Gravida Para Term Preterm AB TAB SAB Ectopic Multiple Living   0 0 0 0 0 0 0 0 0 0      Review of Systems  10 systems reviewed and found to be negative, except as noted in the HPI.   Allergies  Albuterol; Aripiprazole; Azelastine; Budesonide; Budesonide-formoterol fumarate; Ciclesonide; Fluticasone; Fluticasone furoate; Fluticasone-salmeterol; Gadolinium derivatives; Hydrocodone-acetaminophen; Iodine; Ivp dye; Latex; Mometasone; Other; Peanut-containing drug products; Penicillins; Petrolatum; Tamsulosin; Tdap; Venlafaxine; Zinc oxide; Albuterol sulfate; Antihistamines, chlorpheniramine-type; Cetirizine & related; Duloxetine; Fexofenadine; Haloperidol and related; Ipratropium-albuterol; Levothyroxine; Levothyroxine sodium; Metformin and related; Montelukast sodium; Omeprazole; Sitagliptin; Ziprasidone hcl; Adhesive; and Bupropion  Home Medications   Prior to Admission medications   Medication Sig Start Date End Date Taking?  Authorizing Provider  albuterol (PROVENTIL HFA;VENTOLIN HFA) 108 (90 BASE) MCG/ACT inhaler Inhale 2 puffs into the lungs every 6 (six) hours as needed for wheezing or shortness of breath.   Yes Historical Provider, MD  b complex vitamins tablet Take 1 tablet by mouth daily.   Yes Historical Provider, MD  cholecalciferol (VITAMIN D) 1000 units tablet Take 1,000 Units by mouth 2 (two)  times daily.   Yes Historical Provider, MD  estradiol (ESTRACE) 1 MG tablet Take 1 tablet (1 mg total) by mouth daily. 12/25/15  Yes Prentice DockerMartin A Defrancesco, MD  Ferrous Sulfate (IRON) 142 (45 Fe) MG TBCR Take by mouth.   Yes Historical Provider, MD  Flaxseed, Linseed, (FLAX SEED OIL PO) Take by mouth.   Yes Historical Provider, MD  folic acid (FOLVITE) 400 MCG tablet Take 400 mcg by mouth daily.   Yes Historical Provider, MD  gabapentin (NEURONTIN) 300 MG capsule Take 300 mg by mouth 4 (four) times daily.   Yes Historical Provider, MD  levocetirizine (XYZAL) 5 MG tablet Take 5 mg by mouth every evening.   Yes Historical Provider, MD  levothyroxine (SYNTHROID, LEVOTHROID) 75 MCG tablet Take 75 mcg by mouth at bedtime.   Yes Historical Provider, MD  LORazepam (ATIVAN) 1 MG tablet Take 1 mg by mouth 2 (two) times daily. Take morning and night.   Yes Historical Provider, MD  Magnesium 250 MG TABS Take by mouth.   Yes Historical Provider, MD  metFORMIN (GLUCOPHAGE-XR) 500 MG 24 hr tablet Take 500 mg by mouth daily with breakfast. Take 2 tablets in the morning and 2 tablets at night.   Yes Historical Provider, MD  montelukast (SINGULAIR) 10 MG tablet Take 10 mg by mouth at bedtime.   Yes Historical Provider, MD  Nebulizers (VIOS AEROSOL DELIVERY SYSTEM) MISC U UTD 08/29/15  Yes Historical Provider, MD  niacin 500 MG tablet Take 500 mg by mouth at bedtime.   Yes Historical Provider, MD  omeprazole (PRILOSEC) 20 MG capsule Take 20 mg by mouth daily. At night.   Yes Historical Provider, MD  ONE TOUCH ULTRA TEST test strip USE TO CHECK BLOOD SUGARS 2 TO 3 TIMES A DAY UTD FOR 90 DAYS 12/16/15  Yes Historical Provider, MD  oxyCODONE-acetaminophen (PERCOCET) 10-325 MG tablet Take by mouth.   Yes Historical Provider, MD  pentosan polysulfate (ELMIRON) 100 MG capsule Take 1 capsule (100 mg total) by mouth 3 (three) times daily. 10/11/15  Yes Vanna ScotlandAshley Brandon, MD  predniSONE (DELTASONE) 20 MG tablet Take 2 tablets (40 mg  total) by mouth daily. Patient taking differently: Take 40 mg by mouth daily as needed (allegies).  08/17/15  Yes Phineas SemenGraydon Goodman, MD  ranitidine (ZANTAC) 75 MG tablet Take 75 mg by mouth every morning.    Yes Historical Provider, MD  Saccharomyces boulardii (PROBIOTIC) 250 MG CAPS Take by mouth.   Yes Historical Provider, MD  thiothixene (NAVANE) 2 MG capsule Take 2 mg by mouth at bedtime.   Yes Historical Provider, MD  tiZANidine (ZANAFLEX) 2 MG tablet Take 2 mg by mouth 3 (three) times daily. Take half tablet every 4 hours.   Yes Historical Provider, MD  topiramate (TOPAMAX) 100 MG tablet Take 100 mg by mouth daily. Take 4 tablets at night.   Yes Historical Provider, MD  valACYclovir (VALTREX) 1000 MG tablet Take 1,000 mg by mouth 3 (three) times daily.   Yes Historical Provider, MD  venlafaxine XR (EFFEXOR-XR) 150 MG 24 hr capsule Take 150 mg by mouth at bedtime.  Yes Historical Provider, MD  XOPENEX HFA 45 MCG/ACT inhaler INL 1 TO 2 PFS PO Q 4 H PRN 07/09/15  Yes Historical Provider, MD  EPINEPHrine 0.3 mg/0.3 mL IJ SOAJ injection Inject 0.3 mLs (0.3 mg total) into the muscle once. 08/17/15   Nance Pear, MD  estradiol (ESTRACE VAGINAL) 0.1 MG/GM vaginal cream Place 0.5 g vaginally 2 (two) times a week. 1/2 g twice a week intravaginal 12/25/15   Alanda Slim Defrancesco, MD  ketoconazole (NIZORAL) 2 % shampoo  11/26/15   Historical Provider, MD   BP 104/72 mmHg  Pulse 84  Temp(Src) 98.1 F (36.7 C) (Oral)  Resp 21  SpO2 100% Physical Exam  Constitutional: She is oriented to person, place, and time. She appears well-developed and well-nourished.  HENT:  Head: Normocephalic and atraumatic.  Mouth/Throat: Oropharynx is clear and moist.  Eyes: Conjunctivae and EOM are normal. Pupils are equal, round, and reactive to light.  No TTP of maxillary or frontal sinuses  No TTP or induration of temporal arteries bilaterally  Neck: Normal range of motion. Neck supple.  FROM to C-spine. Pt can  touch chin to chest without discomfort. No TTP of midline cervical spine.   Cardiovascular: Normal rate, regular rhythm and intact distal pulses.   Pulmonary/Chest: Effort normal and breath sounds normal. No respiratory distress. She has no wheezes. She has no rales. She exhibits no tenderness.  Abdominal: Soft. Bowel sounds are normal. There is no tenderness.  Musculoskeletal: Normal range of motion. She exhibits no edema or tenderness.  Neurological: She is alert and oriented to person, place, and time. No cranial nerve deficit.  II-Visual fields grossly intact. III/IV/VI-Extraocular movements intact.  Pupils reactive bilaterally. V/VII-Smile symmetric, equal eyebrow raise,  facial sensation intact VIII- Hearing grossly intact IX/X-Normal gag XI-bilateral shoulder shrug XII-midline tongue extension Motor: 3 out of 5 strength to right upper extremity and right lower extremity. Cerebellar: Normal finger-to-nose  and normal heel-to-shin test. No dysmetria however patient is much slower on the right hand and right leg  ++Positive pronator drift on the right side, patient self corrects mid drift with eyes closed and it again drifts to the right, does not touch the bed ++Ambulates with a coordinated gait however patient cannot stand on her tiptoes on the right leg.  Sensation is decreased: Right lower extremity (60% of left) Right upper extremity (40% of left) Right upper and lower face (30% of left)   Nursing note and vitals reviewed.   ED Course  Procedures (including critical care time) Labs Review Labs Reviewed  BASIC METABOLIC PANEL - Abnormal; Notable for the following:    BUN 5 (*)    All other components within normal limits  CBC  PROTIME-INR  APTT  I-STAT TROPOININ, ED  CBG MONITORING, ED  I-STAT CHEM 8, ED    Imaging Review Dg Chest 2 View  02/14/2016  CLINICAL DATA:  Shortness of breath and chest pain EXAM: CHEST  2 VIEW COMPARISON:  December 14, 2013 FINDINGS: Lungs are  clear. Heart size and pulmonary vascularity are normal. No adenopathy. No pneumothorax. No bone lesions. IMPRESSION: No edema or consolidation. Electronically Signed   By: Lowella Grip III M.D.   On: 02/14/2016 10:21   Ct Head Wo Contrast  02/14/2016  CLINICAL DATA:  Dizziness with right arm drift EXAM: CT HEAD WITHOUT CONTRAST TECHNIQUE: Contiguous axial images were obtained from the base of the skull through the vertex without intravenous contrast. COMPARISON:  November 06, 2009. FINDINGS: The ventricles are normal  in size and configuration. There is no intracranial mass, hemorrhage, extra-axial fluid collection, or midline shift. Gray-white compartments appear normal. No acute infarct evident. Middle cerebral artery attenuation is symmetric bilaterally. The bony calvarium appears intact. The mastoid air cells are clear. No intraorbital lesions are evident. IMPRESSION: Study within normal limits. Critical Value/emergent results were called by telephone at the time of interpretation on 02/14/2016 at 11:46 am to Dr. Roland Rack, neurology, who verbally acknowledged these results. Electronically Signed   By: Lowella Grip III M.D.   On: 02/14/2016 11:47   Mr Jodene Nam Head Wo Contrast  02/14/2016  CLINICAL DATA:  Dizziness beginning yesterday. Beginning today she has decreased sensation in her right upper and lower extremity with weakness. EXAM: MRI HEAD WITHOUT CONTRAST MRA HEAD WITHOUT CONTRAST TECHNIQUE: Multiplanar, multiecho pulse sequences of the brain and surrounding structures were obtained without intravenous contrast. Angiographic images of the head were obtained using MRA technique without contrast. COMPARISON:  CT head without contrast 02/14/2015 FINDINGS: MRI HEAD FINDINGS An 8 mm T2 hyper density is noted adjacent to the atrium of the right lateral ventricle and image 13 of series 8. Three additional punctate T2 hyperintensities are present in the posterior left frontal lobe. There is mild T2  hyperintensity adjacent to the ventricles. No restricted diffusion is present. Acute hemorrhage, infarct, or mass lesion is present. The ventricles are of normal size. Additional T2 hyperintensities are noted within the central pons. The brainstem and cerebellum are otherwise normal. The internal auditory canals are within normal limits bilaterally. Flow is present in the major intracranial arteries. The globes and orbits are intact. Skullbase is within normal limits. Midline sagittal images are unremarkable. MRA HEAD FINDINGS The internal auditory canals are within normal limits the high cervical segments through the ICA termini. The A1 and M1 segments are normal. Anterior communicating artery is patent. ACA and MCA branch vessels are within normal limits. The left vertebral artery is slightly dominant to the right. The right PICA origin is visualized and normal. The left AICA is dominant. The basilar artery is within normal limits. Left posterior cerebral artery originates from the basilar tip. The right posterior cerebral artery is fed by a P1 segment and a right posterior communicating artery. The PCA branch vessels are within normal limits. IMPRESSION: 1. Scattered periventricular and subcortical T2 hyperintensities are mildly increased for age. The finding is nonspecific but can be seen in the setting of chronic microvascular ischemia, a demyelinating process such as multiple sclerosis, vasculitis, complicated migraine headaches, or as the sequelae of a prior infectious or inflammatory process. 2. T2 changes within the central pons 10 to be associated with ischemic changes rather than demyelinating disease. 3. Normal variant MRA circle of Willis without significant proximal stenosis, aneurysm, or branch vessel occlusion. Electronically Signed   By: San Morelle M.D.   On: 02/14/2016 14:44   Mr Brain Wo Contrast  02/14/2016  CLINICAL DATA:  Dizziness beginning yesterday. Beginning today she has  decreased sensation in her right upper and lower extremity with weakness. EXAM: MRI HEAD WITHOUT CONTRAST MRA HEAD WITHOUT CONTRAST TECHNIQUE: Multiplanar, multiecho pulse sequences of the brain and surrounding structures were obtained without intravenous contrast. Angiographic images of the head were obtained using MRA technique without contrast. COMPARISON:  CT head without contrast 02/14/2015 FINDINGS: MRI HEAD FINDINGS An 8 mm T2 hyper density is noted adjacent to the atrium of the right lateral ventricle and image 13 of series 8. Three additional punctate T2 hyperintensities are present in the  posterior left frontal lobe. There is mild T2 hyperintensity adjacent to the ventricles. No restricted diffusion is present. Acute hemorrhage, infarct, or mass lesion is present. The ventricles are of normal size. Additional T2 hyperintensities are noted within the central pons. The brainstem and cerebellum are otherwise normal. The internal auditory canals are within normal limits bilaterally. Flow is present in the major intracranial arteries. The globes and orbits are intact. Skullbase is within normal limits. Midline sagittal images are unremarkable. MRA HEAD FINDINGS The internal auditory canals are within normal limits the high cervical segments through the ICA termini. The A1 and M1 segments are normal. Anterior communicating artery is patent. ACA and MCA branch vessels are within normal limits. The left vertebral artery is slightly dominant to the right. The right PICA origin is visualized and normal. The left AICA is dominant. The basilar artery is within normal limits. Left posterior cerebral artery originates from the basilar tip. The right posterior cerebral artery is fed by a P1 segment and a right posterior communicating artery. The PCA branch vessels are within normal limits. IMPRESSION: 1. Scattered periventricular and subcortical T2 hyperintensities are mildly increased for age. The finding is nonspecific  but can be seen in the setting of chronic microvascular ischemia, a demyelinating process such as multiple sclerosis, vasculitis, complicated migraine headaches, or as the sequelae of a prior infectious or inflammatory process. 2. T2 changes within the central pons 10 to be associated with ischemic changes rather than demyelinating disease. 3. Normal variant MRA circle of Willis without significant proximal stenosis, aneurysm, or branch vessel occlusion. Electronically Signed   By: San Morelle M.D.   On: 02/14/2016 14:44   I have personally reviewed and evaluated these images and lab results as part of my medical decision-making.   EKG Interpretation   Date/Time:  Friday February 14 2016 09:53:02 EDT Ventricular Rate:  90 PR Interval:  180 QRS Duration: 66 QT Interval:  346 QTC Calculation: 423 R Axis:   78 Text Interpretation:  Normal sinus rhythm Normal ECG When compared with  ECG of 12/14/2013 T wave abnormality Anterior leads is no longer Present  Confirmed by [facility]  MD, Nunzio Cory [phone]) on 02/14/2016 12:26:29 PM      MDM   Final diagnoses:  Palpitations  Chest pain, unspecified chest pain type  Weakness  Vertigo    Filed Vitals:   02/14/16 1245 02/14/16 1300 02/14/16 1315 02/14/16 1324  BP: 105/69 106/68 104/72   Pulse: 84 88 84   Temp:    98.1 F (36.7 C)  TempSrc:      Resp: 16 17 21    SpO2: 98% 98% 100%     Stacy Daniel is 45 y.o. female presenting with Chest pain, palpitations, vertigo and right-sided weakness with difficulty word finding, initially it was thought that this was a code stroke she says that the symptoms started at approximately 9 AM however, on further discussion it appears that the symptoms actually started yesterday and persisted through today. Physical exam concerning for right-sided upper and lower extremity weakness, there is no facial droop, patient with no dysarthria. She is reduced sensation on the right side on the face, arm and leg. This  patient has been evaluated by neurology Dr. Leonel Ramsay who has evaluated the CT and recommends MRI. If MRI is negative patient can return home for outpatient follow-up with neurology.  MRI/MRA with scattered periventricular and subcortical T2 hyperintensities, which is nonspecific but may be seen in chronic microvascular ischemia and demyelinating processes or complicated  migraines is also T2 changes within the central pons associated with ischemic changes, will discuss with neurology. Would like to obtain MRI with contrast however patient has anaphylactic reaction to gadolinium. Discussed with Dr.  Leonel Ramsay who has reviewed the MRI with the radiologist, feels that this is likely chronic microvascular disease in nature and patient can be discharged home from a neurologic standpoint for outpatient follow-up.  Test results and plan with patient, states that she has a neurologist who is in [place] which is 2 hours away from her health's, she would like a more local neurologist, will be referred to [facility] neurology. States that she sees this neurologist because they specialize in ?anaphylactic reaction. Counseled patient to not hesitate to return to the ED for any new, worsening or concerning symptoms. Patient verbalized her understanding.  Evaluation does not show pathology that would require ongoing emergent intervention or inpatient treatment. Pt is hemodynamically stable and mentating appropriately. Discussed findings and plan with patient/guardian, who agrees with care plan. All questions answered. Return precautions discussed and outpatient follow up given.     Monico Blitz, PA-C 02/14/16 [place], DO 02/15/16 [phone]

## 2016-02-14 NOTE — Discharge Instructions (Signed)
Please follow with her outpatient neurologist as soon as possible.  Do not hesitate to return to the emergency room for any new, worsening or concerning symptoms.  Please let your primary care physician know you were seen in the emergency department, that we'll have to obtain records for further outpatient management.

## 2016-02-14 NOTE — ED Notes (Signed)
Pt ambulates independently and with steady gait at time of discharge. Discharge instructions and follow up information reviewed with patient. No other questions or concerns voiced at this time.  

## 2016-02-14 NOTE — ED Notes (Signed)
Pt states upper abd and chest pain, rt arm pain since 9 am and shoulder pain

## 2016-02-14 NOTE — ED Notes (Signed)
Neuro check completed early so pt can go to MRI.

## 2016-02-18 ENCOUNTER — Ambulatory Visit (INDEPENDENT_AMBULATORY_CARE_PROVIDER_SITE_OTHER): Payer: PPO | Admitting: Diagnostic Neuroimaging

## 2016-02-18 ENCOUNTER — Encounter: Payer: Self-pay | Admitting: Diagnostic Neuroimaging

## 2016-02-18 VITALS — BP 115/81 | HR 89 | Ht 63.0 in | Wt 209.6 lb

## 2016-02-18 DIAGNOSIS — F410 Panic disorder [episodic paroxysmal anxiety] without agoraphobia: Secondary | ICD-10-CM | POA: Diagnosis not present

## 2016-02-18 DIAGNOSIS — G43909 Migraine, unspecified, not intractable, without status migrainosus: Secondary | ICD-10-CM

## 2016-02-18 DIAGNOSIS — G43109 Migraine with aura, not intractable, without status migrainosus: Secondary | ICD-10-CM

## 2016-02-18 NOTE — Progress Notes (Signed)
[facility]  PATIENT: Stacy Daniel DOB: [birth year]  REFERRING CLINICIAN: Leonel Ramsay, m HISTORY FROM: patient  REASON FOR VISIT: new consult    HISTORICAL  CHIEF COMPLAINT:  Chief Complaint  Patient presents with  . Stroke    rm 7, New pt, ED referral, " TIA, R side tactile numbness and internal numbness, R side- face 70%, arm 40%, legs 30 %, internal 90%, gums/tongue right side 100 %; bladder control and sensation on R side came back Sunday at 5 pm"     HISTORY OF PRESENT ILLNESS:   45 year old ambidextrous female with metabolic syndrome, polycystic ovarian syndrome, hyperglycemia, migraine, anxiety, depression, fibromyalgia, bipolar disorder, here for evaluation of numbness.  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 Blood pressure noted to be 140/106. Heart rate was 96. Patient was able to calm down but felt fatigued afterwards. 02/14/16 patient similar symptoms identified in the morning and went to the emergency room. During that evaluation she was noted to feel numbness and tingling on her entire right side of the body including face, arm, leg, tongue and mouth. Patient felt abnormal dimming of her color sensation. Symptoms lasted for several days and then resolve. During emergency room evaluation CT scan of the head, MRI of the brain lab testing were unremarkable.  Patient has had history of headaches since age 22 years old, typically affecting back of her head. She was diagnosed with migraine headache. Sometimes this with vertigo, dim color sensation, bitemporal headache, retro-orbital pain, left worse than right, associated with photophobia, nausea. Headaches occur at least twice per month.   REVIEW OF SYSTEMS: Full 14 system review of systems performed and negative with exception of: Memory loss confusion headache numbness weakness dizziness tremor depression  anxiety to much sleep decreased energy hallucinations racing thoughts insomnia sleepiness snoring restless legs broke vision eye pain shortness of breath snoring urination probably condescending sensation rash itching moles palpitations fatigue fevers chills.   ALLERGIES: Allergies  Allergen Reactions  . Albuterol Anaphylaxis  . Aripiprazole Anaphylaxis  . Azelastine Anaphylaxis  . Budesonide Anaphylaxis  . Budesonide-Formoterol Fumarate Anaphylaxis  . Ciclesonide Anaphylaxis  . Fluticasone Anaphylaxis  . Fluticasone Furoate Anaphylaxis  . Fluticasone-Salmeterol Anaphylaxis  . Gadolinium Derivatives Anaphylaxis  . Hydrocodone-Acetaminophen Anaphylaxis  . Iodine Anaphylaxis    And iodide containing products   . Ivp Dye [Iodinated Diagnostic Agents] Anaphylaxis  . Latex Anaphylaxis  . Mometasone Anaphylaxis  . Other Anaphylaxis    Petrochemical products  . Peanut-Containing Drug Products Anaphylaxis  . Penicillins Anaphylaxis  . Petrolatum Anaphylaxis    Yellow   . Tamsulosin Anaphylaxis  . Tdap [Diphth-Acell Pertussis-Tetanus] Anaphylaxis  . Venlafaxine Other (See Comments)    Migraine, causes eye to close  . Zinc Oxide Anaphylaxis  . Albuterol Sulfate Other (See Comments)    Gi distress   . Antihistamines, Chlorpheniramine-Type Other (See Comments)    Gi distress   . Cetirizine & Related Other (See Comments)    Gi distress   . Duloxetine Other (See Comments)    Gi distress   . Fexofenadine Other (See Comments)    Gi distress   . Haloperidol And Related Other (See Comments)    Gi distress   . Ipratropium-Albuterol Other (See Comments)    Gi distress   . Levothyroxine Other (See Comments)    Gi distress   . Levothyroxine Sodium Nausea And Vomiting  . Metformin And Related Nausea  And Vomiting  . Montelukast Sodium Other (See Comments)    Gi distress   . Omeprazole Nausea And Vomiting  . Sitagliptin Nausea And Vomiting  . Ziprasidone Hcl Other (See  Comments)    Gi distress   . Adhesive [Tape] Hives and Rash  . Bupropion Anxiety    HOME MEDICATIONS: Outpatient Prescriptions Prior to Visit  Medication Sig Dispense Refill  . albuterol (PROVENTIL HFA;VENTOLIN HFA) 108 (90 BASE) MCG/ACT inhaler Inhale 2 puffs into the lungs every 6 (six) hours as needed for wheezing or shortness of breath.    Marland Kitchen b complex vitamins tablet Take 1 tablet by mouth daily.    . cholecalciferol (VITAMIN D) 1000 units tablet Take 1,000 Units by mouth 2 (two) times daily.    Marland Kitchen EPINEPHrine 0.3 mg/0.3 mL IJ SOAJ injection Inject 0.3 mLs (0.3 mg total) into the muscle once. 1 Device 0  . estradiol (ESTRACE VAGINAL) 0.1 MG/GM vaginal cream Place 0.5 g vaginally 2 (two) times a week. 1/2 g twice a week intravaginal 42.5 g 2  . estradiol (ESTRACE) 1 MG tablet Take 1 tablet (1 mg total) by mouth daily. 90 tablet 3  . Ferrous Sulfate (IRON) 142 (45 Fe) MG TBCR Take by mouth.    . Flaxseed, Linseed, (FLAX SEED OIL PO) Take by mouth.    . gabapentin (NEURONTIN) 300 MG capsule Take 300 mg by mouth 4 (four) times daily.    Marland Kitchen ketoconazole (NIZORAL) 2 % shampoo     . levocetirizine (XYZAL) 5 MG tablet Take 5 mg by mouth every evening.    Marland Kitchen levothyroxine (SYNTHROID, LEVOTHROID) 75 MCG tablet Take 75 mcg by mouth at bedtime.    Marland Kitchen LORazepam (ATIVAN) 1 MG tablet Take 1 mg by mouth 2 (two) times daily. Take morning and night.    . Magnesium 250 MG TABS Take by mouth.    . metFORMIN (GLUCOPHAGE-XR) 500 MG 24 hr tablet Take 500 mg by mouth daily with breakfast. Take 2 tablets in the morning and 2 tablets at night.    . montelukast (SINGULAIR) 10 MG tablet Take 10 mg by mouth at bedtime.    . Nebulizers (VIOS AEROSOL DELIVERY SYSTEM) MISC U UTD  1  . niacin 500 MG tablet Take 500 mg by mouth at bedtime.    Marland Kitchen omeprazole (PRILOSEC) 20 MG capsule Take 20 mg by mouth daily. At night.    . ONE TOUCH ULTRA TEST test strip USE TO CHECK BLOOD SUGARS 2 TO 3 TIMES A DAY UTD FOR 90 DAYS  3  .  oxyCODONE-acetaminophen (PERCOCET) 10-325 MG tablet Take by mouth.    . pentosan polysulfate (ELMIRON) 100 MG capsule Take 1 capsule (100 mg total) by mouth 3 (three) times daily. 90 capsule 11  . predniSONE (DELTASONE) 20 MG tablet Take 2 tablets (40 mg total) by mouth daily. (Patient taking differently: Take 40 mg by mouth daily as needed (allegies). ) 8 tablet 0  . ranitidine (ZANTAC) 75 MG tablet Take 75 mg by mouth every morning.     . Saccharomyces boulardii (PROBIOTIC) 250 MG CAPS Take by mouth.    . thiothixene (NAVANE) 2 MG capsule Take 2 mg by mouth at bedtime.    Marland Kitchen tiZANidine (ZANAFLEX) 2 MG tablet Take 2 mg by mouth 3 (three) times daily. Take half tablet every 4 hours.    . topiramate (TOPAMAX) 100 MG tablet Take 100 mg by mouth daily. Take 4 tablets at night.    . valACYclovir (VALTREX) 1000 MG  tablet Take 1,000 mg by mouth 3 (three) times daily.    Marland Kitchen venlafaxine XR (EFFEXOR-XR) 150 MG 24 hr capsule Take 150 mg by mouth at bedtime.    Penne Lash HFA 45 MCG/ACT inhaler INL 1 TO 2 PFS PO Q 4 H PRN  0  . folic acid (FOLVITE) [id number] MCG tablet Take 400 mcg by mouth daily. Reported on 02/18/2016     No facility-administered medications prior to visit.    PAST MEDICAL HISTORY: Past Medical History  Diagnosis Date  . Diabetes mellitus without complication ([place])   . Fibromyalgia   . PCOS (polycystic ovarian syndrome)   . Angioedema   . COPD (chronic obstructive pulmonary disease) ([place])   . Hypoglycemia   . Chronic bronchitis ([place])   . Shingles (herpes zoster) polyneuropathy   . Rheumatoid arthritis ([place])   . Osteoarthritis   . Bipolar 1 disorder ([place])   . Borderline personality disorder   . Schizoaffective disorder, bipolar type ([place])   . Anxiety   . Post traumatic stress disorder   . OCD (obsessive compulsive disorder)   . Rheumatoid arthritis ([place])   . Osteoarthritis   . Breast pain in female     chronic, right  . IC (interstitial cystitis)   . Stroke ([place]) 02/14/16    TIA      PAST SURGICAL HISTORY: Past Surgical History  Procedure Laterality Date  . Ganglion cyst excision  11/2010    right wrist  . Wisdom tooth extraction  1988  . Oophorectomy Right 1995  . Abdominal hysterectomy  06/1997    with left ovarectomy    FAMILY HISTORY: Family History  Problem Relation Age of Onset  . Bladder Cancer Neg Hx   . Prostate cancer Neg Hx   . Kidney cancer Neg Hx   . Ovarian cancer Neg Hx   . Colon cancer Neg Hx   . Heart disease Neg Hx   . Stroke Paternal Aunt   . Diabetes Father   . Obesity Father   . Hyperthyroidism Mother   . Cancer Sister     skin  . Osteoporosis Maternal Grandmother   . Hyperthyroidism Maternal Grandmother   . Bipolar disorder Maternal Grandmother   . Heart attack Maternal Grandfather   . Diabetes Paternal Grandmother   . Heart attack Paternal Grandfather   . Breast cancer Paternal Aunt   . Diabetes Paternal Aunt   . Breast cancer Cousin   . Diabetes Cousin   . Bipolar disorder Cousin     SOCIAL HISTORY:  Social History   Social History  . Marital Status: Single    Spouse Name: N/A  . Number of Children: 0  . Years of Education: 16   Occupational History  .      volunteers   Social History Main Topics  . Smoking status: Never Smoker   . Smokeless tobacco: Not on file  . Alcohol Use: No     Comment: quit 2011  . Drug Use: No  . Sexual Activity: Not Currently    Birth Control/ Protection: Surgical   Other Topics Concern  . Not on file   Social History Narrative   Lives with parents   Caffeine use- coffee 2 daily, soda 2 daily     PHYSICAL EXAM  GENERAL EXAM/CONSTITUTIONAL: Vitals:  Filed Vitals:   02/18/16 1022  BP: 115/81  Pulse: 89  Height: 5\' 3"  (1.6 m)  Weight: 209 lb 9.6 oz (95.074 kg)     Body mass index  is 37.14 kg/(m^2).  Visual Acuity Screening   Right eye Left eye Both eyes  Without correction:     With correction: 20/20 20/20      Patient is in no distress; well developed,  nourished and groomed; neck is supple  CARDIOVASCULAR:  Examination of carotid arteries is normal; no carotid bruits  Regular rate and rhythm, no murmurs  Examination of peripheral vascular system by observation and palpation is normal  EYES:  Ophthalmoscopic exam of optic discs and posterior segments is normal; no papilledema or hemorrhages  MUSCULOSKELETAL:  Gait, strength, tone, movements noted in Neurologic exam below  NEUROLOGIC: MENTAL STATUS:  No flowsheet data found.  awake, alert, oriented to person, place and time  recent and remote memory intact  normal attention and concentration  language fluent, comprehension intact, naming intact,   fund of knowledge appropriate  CRANIAL NERVE:   2nd - no papilledema on fundoscopic exam  2nd, 3rd, 4th, 6th - pupils equal and reactive to light, visual fields full to confrontation, extraocular muscles intact, no nystagmus  5th - facial sensation symmetric  7th - facial strength symmetric  8th - hearing intact  9th - palate elevates symmetrically, uvula midline  11th - shoulder shrug symmetric  12th - tongue protrusion midline  MOTOR:   normal bulk and tone, full strength in the BUE, BLE  SENSORY:   normal and symmetric to light touch, temperature, vibration  COORDINATION:   finger-nose-finger, fine finger movements normal  REFLEXES:   deep tendon reflexes present and symmetric  GAIT/STATION:   narrow based gait; romberg --> LEANING TO RIGHT SIDE    DIAGNOSTIC DATA (LABS, IMAGING, TESTING) - I reviewed patient records, labs, notes, testing and imaging myself where available.  Lab Results  Component Value Date   WBC 7.8 02/14/2016   HGB 14.6 02/14/2016   HCT 45.1 02/14/2016   MCV 90.2 02/14/2016   PLT 248 02/14/2016      Component Value Date/Time   NA 140 02/14/2016 1029   K 3.7 02/14/2016 1029   CL 109 02/14/2016 1029   CO2 23 02/14/2016 1029   GLUCOSE 80 02/14/2016 1029   BUN 5*  02/14/2016 1029   CREATININE 0.57 02/14/2016 1029   CALCIUM 9.3 02/14/2016 1029   PROT 7.0 06/06/2010 1923   ALBUMIN 3.7 06/06/2010 1923   AST 36 06/06/2010 1923   ALT 55* 06/06/2010 1923   ALKPHOS 64 06/06/2010 1923   BILITOT 0.4 06/06/2010 1923   GFRNONAA >60 02/14/2016 1029   GFRAA >60 02/14/2016 1029   No results found for: CHOL, HDL, LDLCALC, LDLDIRECT, TRIG, CHOLHDL No results found for: HGBA1C No results found for: VITAMINB12 No results found for: TSH  02/14/16 MRI brain [I reviewed images myself and agree with interpretation. -VRP]  - Scattered periventricular and subcortical T2 hyperintensities are mildly increased for age. The finding is nonspecific but can be seen in the setting of chronic microvascular ischemia, a demyelinating process such as multiple sclerosis, vasculitis, complicated migraine headaches, or as the sequelae of a prior infectious or inflammatory process. - T2 changes within the central pons 10 to be associated with ischemic changes rather than demyelinating disease.   02/14/16 MRA head [I reviewed images myself and agree with interpretation. -VRP]  - Normal variant MRA circle of Willis without significant proximal stenosis, aneurysm, or branch vessel occlusion.  02/14/16 EKG [I reviewed images myself and agree with interpretation. -VRP]  - normal sinus rhythm      ASSESSMENT AND PLAN  45  y.o. year old female here with right-sided numbness, tingling, lasting 3 days, with negative MRI, MRA. Also with history of migraine headaches, depression, anxiety, bipolar, fibromyalgia. May represent complicated migraine or panic attack. TIA less likely.  Ddx: complicated migraine vs panic attack; (TIA less likely)  1. Complicated migraine   2. Panic attack     PLAN: - follow up with PCP for risk factor screening and mgmt - continue topiramate and gabapentin - start headache diary; review migraine triggers  Return in about 6 months (around 08/19/2016), or if  symptoms worsen or fail to improve.    Penni Bombard, MD [id number], [id number] AM Certified in Neurology, Neurophysiology and Neuroimaging  [facility] [address], [place], [place] [address] [phone]

## 2016-02-18 NOTE — Patient Instructions (Addendum)
Thank you for coming to see [place] at [facility]. I hope we have been able to provide you high quality care today.  You may receive a patient satisfaction survey over the next few weeks. We would appreciate your feedback and comments so that we may continue to improve ourselves and the health of our patients.  - Follow up as needed.  - Start headache diary.  - To prevent or relieve headaches, try the following:   Cool Compress. Lie down and place a cool compress on your head.   Avoid headache triggers. If certain foods or odors seem to have triggered your migraines in the past, avoid them. A headache diary might help you identify triggers.   Include physical activity in your daily routine.   Manage stress. Find healthy ways to cope with the stressors, such as delegating tasks on your to-do list.   Practice relaxation techniques. Try deep breathing, yoga, massage and visualization.   Eat regularly. Eating regularly scheduled meals and maintaining a healthy diet might help prevent headaches. Also, drink plenty of fluids.   Follow a regular sleep schedule. Sleep deprivation might contribute to headaches  Consider biofeedback. With this mind-body technique, you learn to control certain bodily functions - such as muscle tension, heart rate and blood pressure - to prevent headaches or reduce headache pain.   ~~~~~~~~~~~~~~~~~~~~~~~~~~~~~~~~~~~~~~~~~~~~~~~~~~~~~~~~~~~~~~~~~  DR. PENUMALLI'S GUIDE TO HAPPY AND HEALTHY LIVING These are some of my general health and wellness recommendations. Some of them may apply to you better than others. Please use common sense as you try these suggestions and feel free to ask me any questions.   ACTIVITY/FITNESS Mental, social, emotional and physical stimulation are very important for brain and body health. Try learning a new activity (arts, music, language, sports, games).  Keep moving your body to the best of your abilities. You  can do this at home, inside or outside, the park, community center, gym or anywhere you like. Consider a physical therapist or personal trainer to get started. Consider the app Sworkit. Fitness trackers such as smart-watches, smart-phones or Fitbits can help as well.   NUTRITION Eat more plants: colorful vegetables, nuts, seeds and berries.  Eat less sugar, salt, preservatives and processed foods.  Avoid toxins such as cigarettes and alcohol.  Drink water when you are thirsty. Warm water with a slice of lemon is an excellent morning drink to start the day.  Consider these websites for more information The Nutrition Source (https://www.henry-hernandez.biz/) [employer] Nutrition (WindowBlog.ch)   RELAXATION Consider practicing mindfulness meditation or other relaxation techniques such as deep breathing, prayer, yoga, tai chi, massage. See website mindful.org or the apps Headspace or Calm to help get started.   SLEEP Try to get at least 7-8+ hours sleep per day. Regular exercise and reduced caffeine will help you sleep better. Practice good sleep hygeine techniques. See website sleep.org for more information.   PLANNING Prepare estate planning, living will, healthcare POA documents. Sometimes this is best planned with the help of an attorney. Theconversationproject.org and agingwithdignity.org are excellent resources.

## 2016-02-21 DIAGNOSIS — R002 Palpitations: Secondary | ICD-10-CM | POA: Diagnosis not present

## 2016-02-21 DIAGNOSIS — G43109 Migraine with aura, not intractable, without status migrainosus: Secondary | ICD-10-CM | POA: Diagnosis not present

## 2016-02-24 DIAGNOSIS — F4481 Dissociative identity disorder: Secondary | ICD-10-CM | POA: Diagnosis not present

## 2016-02-24 DIAGNOSIS — F4310 Post-traumatic stress disorder, unspecified: Secondary | ICD-10-CM | POA: Diagnosis not present

## 2016-02-24 DIAGNOSIS — F3181 Bipolar II disorder: Secondary | ICD-10-CM | POA: Diagnosis not present

## 2016-02-24 DIAGNOSIS — F29 Unspecified psychosis not due to a substance or known physiological condition: Secondary | ICD-10-CM | POA: Diagnosis not present

## 2016-02-26 DIAGNOSIS — R51 Headache: Secondary | ICD-10-CM | POA: Diagnosis not present

## 2016-02-26 DIAGNOSIS — R0602 Shortness of breath: Secondary | ICD-10-CM | POA: Diagnosis not present

## 2016-02-26 DIAGNOSIS — E7800 Pure hypercholesterolemia, unspecified: Secondary | ICD-10-CM | POA: Diagnosis not present

## 2016-02-26 DIAGNOSIS — R519 Headache, unspecified: Secondary | ICD-10-CM | POA: Insufficient documentation

## 2016-02-26 DIAGNOSIS — R002 Palpitations: Secondary | ICD-10-CM | POA: Diagnosis not present

## 2016-02-27 DIAGNOSIS — R002 Palpitations: Secondary | ICD-10-CM | POA: Diagnosis not present

## 2016-03-04 DIAGNOSIS — W5501XA Bitten by cat, initial encounter: Secondary | ICD-10-CM | POA: Diagnosis not present

## 2016-03-04 DIAGNOSIS — L089 Local infection of the skin and subcutaneous tissue, unspecified: Secondary | ICD-10-CM | POA: Diagnosis not present

## 2016-03-04 DIAGNOSIS — E669 Obesity, unspecified: Secondary | ICD-10-CM | POA: Diagnosis not present

## 2016-03-04 DIAGNOSIS — S61052A Open bite of left thumb without damage to nail, initial encounter: Secondary | ICD-10-CM | POA: Diagnosis not present

## 2016-03-05 DIAGNOSIS — E6609 Other obesity due to excess calories: Secondary | ICD-10-CM | POA: Diagnosis not present

## 2016-03-05 DIAGNOSIS — R05 Cough: Secondary | ICD-10-CM | POA: Diagnosis not present

## 2016-03-05 DIAGNOSIS — B372 Candidiasis of skin and nail: Secondary | ICD-10-CM | POA: Diagnosis not present

## 2016-03-05 DIAGNOSIS — J4530 Mild persistent asthma, uncomplicated: Secondary | ICD-10-CM | POA: Diagnosis not present

## 2016-03-17 DIAGNOSIS — M797 Fibromyalgia: Secondary | ICD-10-CM | POA: Diagnosis not present

## 2016-03-17 DIAGNOSIS — E119 Type 2 diabetes mellitus without complications: Secondary | ICD-10-CM | POA: Diagnosis not present

## 2016-03-17 DIAGNOSIS — M064 Inflammatory polyarthropathy: Secondary | ICD-10-CM | POA: Diagnosis not present

## 2016-03-17 DIAGNOSIS — J302 Other seasonal allergic rhinitis: Secondary | ICD-10-CM | POA: Diagnosis not present

## 2016-03-17 DIAGNOSIS — J3089 Other allergic rhinitis: Secondary | ICD-10-CM | POA: Diagnosis not present

## 2016-03-17 DIAGNOSIS — E559 Vitamin D deficiency, unspecified: Secondary | ICD-10-CM | POA: Diagnosis not present

## 2016-03-17 DIAGNOSIS — M810 Age-related osteoporosis without current pathological fracture: Secondary | ICD-10-CM | POA: Diagnosis not present

## 2016-03-17 DIAGNOSIS — G441 Vascular headache, not elsewhere classified: Secondary | ICD-10-CM | POA: Diagnosis not present

## 2016-03-17 DIAGNOSIS — E039 Hypothyroidism, unspecified: Secondary | ICD-10-CM | POA: Diagnosis not present

## 2016-03-17 DIAGNOSIS — G894 Chronic pain syndrome: Secondary | ICD-10-CM | POA: Diagnosis not present

## 2016-03-17 DIAGNOSIS — J301 Allergic rhinitis due to pollen: Secondary | ICD-10-CM | POA: Diagnosis not present

## 2016-03-17 DIAGNOSIS — J4540 Moderate persistent asthma, uncomplicated: Secondary | ICD-10-CM | POA: Diagnosis not present

## 2016-03-30 DIAGNOSIS — F4481 Dissociative identity disorder: Secondary | ICD-10-CM | POA: Diagnosis not present

## 2016-03-30 DIAGNOSIS — M79672 Pain in left foot: Secondary | ICD-10-CM | POA: Diagnosis not present

## 2016-03-30 DIAGNOSIS — F29 Unspecified psychosis not due to a substance or known physiological condition: Secondary | ICD-10-CM | POA: Diagnosis not present

## 2016-03-30 DIAGNOSIS — M659 Synovitis and tenosynovitis, unspecified: Secondary | ICD-10-CM | POA: Diagnosis not present

## 2016-03-30 DIAGNOSIS — F3181 Bipolar II disorder: Secondary | ICD-10-CM | POA: Diagnosis not present

## 2016-03-30 DIAGNOSIS — F4310 Post-traumatic stress disorder, unspecified: Secondary | ICD-10-CM | POA: Diagnosis not present

## 2016-04-06 DIAGNOSIS — Z79899 Other long term (current) drug therapy: Secondary | ICD-10-CM | POA: Diagnosis not present

## 2016-04-09 ENCOUNTER — Ambulatory Visit (INDEPENDENT_AMBULATORY_CARE_PROVIDER_SITE_OTHER): Payer: PPO | Admitting: Gastroenterology

## 2016-04-09 ENCOUNTER — Encounter: Payer: Self-pay | Admitting: Gastroenterology

## 2016-04-09 VITALS — BP 109/73 | HR 88 | Temp 98.3°F | Ht 63.0 in | Wt 210.0 lb

## 2016-04-09 DIAGNOSIS — R748 Abnormal levels of other serum enzymes: Secondary | ICD-10-CM | POA: Diagnosis not present

## 2016-04-09 NOTE — Progress Notes (Signed)
Primary Care Physician: [facility], MD  Primary Gastroenterologist:  Dr. Lucilla Lame  Chief Complaint  Patient presents with  . Elevated Hepatic Enzymes    HPI: Stacy Daniel is a 45 y.o. female here for abnormal liver enzymes.The patient has seen me in the past for the abnormal liver enzymes.  She had seen me the same week she had been at [facility] for the same issue and had a extensive workup of her abnormal liver enzymes at that time.  The patient was found to have fatty liver.  The patient went down to possibly 180 pounds in her liver enzymes back down to normal at around 20.  The patient has now gained weight and is at 210 pounds in her ALT is elevated again.  Current Outpatient Prescriptions  Medication Sig Dispense Refill  . albuterol (PROVENTIL HFA;VENTOLIN HFA) 108 (90 BASE) MCG/ACT inhaler Inhale 2 puffs into the lungs every 6 (six) hours as needed for wheezing or shortness of breath.    . Ascorbic Acid (VITAMIN C ER PO) Take 120 mg by mouth.    Marland Kitchen b complex vitamins tablet Take 1 tablet by mouth daily.    . cholecalciferol (VITAMIN D) 1000 units tablet Take 1,000 Units by mouth 2 (two) times daily.    . diphenhydrAMINE (BENADRYL) 50 MG capsule Take by mouth. Takes 25 mg/4 for angioedema    . EPINEPHrine 0.3 mg/0.3 mL IJ SOAJ injection Inject 0.3 mLs (0.3 mg total) into the muscle once. 1 Device 0  . estradiol (ESTRACE VAGINAL) 0.1 MG/GM vaginal cream Place 0.5 g vaginally 2 (two) times a week. 1/2 g twice a week intravaginal 42.5 g 2  . estradiol (ESTRACE) 1 MG tablet Take 1 tablet (1 mg total) by mouth daily. 90 tablet 3  . Ferrous Sulfate (IRON) 142 (45 Fe) MG TBCR Take by mouth.    . Flaxseed, Linseed, (FLAX SEED OIL PO) Take by mouth.    . Flunisolide HFA 80 MCG/ACT AERS Inhale 1 puff into the lungs daily.    . folic acid (FOLVITE) A999333 MCG tablet Take 400 mcg by mouth daily. Reported on 02/18/2016    . gabapentin (NEURONTIN) 300 MG capsule Take 300 mg by mouth 4 (four)  times daily.    Marland Kitchen ketoconazole (NIZORAL) 2 % shampoo     . levocetirizine (XYZAL) 5 MG tablet Take 5 mg by mouth every evening.    Marland Kitchen levothyroxine (SYNTHROID, LEVOTHROID) 75 MCG tablet Take 75 mcg by mouth at bedtime.    Marland Kitchen LORazepam (ATIVAN) 1 MG tablet Take 1 mg by mouth 2 (two) times daily. Take morning and night.    . Magnesium 250 MG TABS Take by mouth.    . metFORMIN (GLUCOPHAGE-XR) 500 MG 24 hr tablet Take 500 mg by mouth daily with breakfast. Take 2 tablets in the morning and 2 tablets at night.    . montelukast (SINGULAIR) 10 MG tablet Take 10 mg by mouth at bedtime.    . Nebulizers (VIOS AEROSOL DELIVERY SYSTEM) MISC U UTD  1  . niacin 500 MG tablet Take 500 mg by mouth at bedtime.    Marland Kitchen omeprazole (PRILOSEC) 20 MG capsule Take 20 mg by mouth daily. At night.    . ONE TOUCH ULTRA TEST test strip USE TO CHECK BLOOD SUGARS 2 TO 3 TIMES A DAY UTD FOR 90 DAYS  3  . oxyCODONE-acetaminophen (PERCOCET) 10-325 MG tablet Take by mouth.    . pentosan polysulfate (ELMIRON) 100 MG capsule Take 1  capsule (100 mg total) by mouth 3 (three) times daily. 90 capsule 11  . predniSONE (DELTASONE) 20 MG tablet Take 2 tablets (40 mg total) by mouth daily. (Patient taking differently: Take 40 mg by mouth daily as needed (allegies). ) 8 tablet 0  . ranitidine (ZANTAC) 75 MG tablet Take 75 mg by mouth every morning.     . Saccharomyces boulardii (PROBIOTIC) 250 MG CAPS Take by mouth.    . theophylline (THEO-24) 300 MG 24 hr capsule Take 300 mg by mouth daily.    Marland Kitchen thiothixene (NAVANE) 2 MG capsule Take 2 mg by mouth at bedtime.    Marland Kitchen tiZANidine (ZANAFLEX) 2 MG tablet Take 2 mg by mouth 3 (three) times daily. Take half tablet every 4 hours.    . topiramate (TOPAMAX) 100 MG tablet Take 100 mg by mouth daily. Take 4 tablets at night.    . valACYclovir (VALTREX) 1000 MG tablet Take 1,000 mg by mouth 3 (three) times daily.    Marland Kitchen venlafaxine XR (EFFEXOR-XR) 150 MG 24 hr capsule Take 150 mg by mouth at bedtime.    Penne Lash HFA 45 MCG/ACT inhaler INL 1 TO 2 PFS PO Q 4 H PRN  0   No current facility-administered medications for this visit.     Allergies as of 04/09/2016 - Review Complete 02/18/2016  Allergen Reaction Noted  . Albuterol Anaphylaxis 08/17/2015  . Aripiprazole Anaphylaxis 08/17/2015  . Azelastine Anaphylaxis 08/17/2015  . Budesonide Anaphylaxis 08/17/2015  . Budesonide-formoterol fumarate Anaphylaxis 08/17/2015  . Ciclesonide Anaphylaxis 08/17/2015  . Fluticasone Anaphylaxis 08/17/2015  . Fluticasone furoate Anaphylaxis 08/17/2015  . Fluticasone-salmeterol Anaphylaxis 08/17/2015  . Gadolinium derivatives Anaphylaxis 08/17/2015  . Hydrocodone-acetaminophen Anaphylaxis 08/17/2015  . Iodine Anaphylaxis 08/17/2015  . Ivp dye [iodinated diagnostic agents] Anaphylaxis 08/17/2015  . Latex Anaphylaxis 08/17/2015  . Mometasone Anaphylaxis 08/17/2015  . Other Anaphylaxis 08/17/2015  . Peanut-containing drug products Anaphylaxis 08/17/2015  . Penicillins Anaphylaxis 08/17/2015  . Petrolatum Anaphylaxis 08/17/2015  . Tamsulosin Anaphylaxis 08/17/2015  . Tdap [diphth-acell pertussis-tetanus] Anaphylaxis 08/17/2015  . Venlafaxine Other (See Comments) 08/17/2015  . Zinc oxide Anaphylaxis 08/17/2015  . Albuterol sulfate Other (See Comments) 08/17/2015  . Antihistamines, chlorpheniramine-type Other (See Comments) 08/17/2015  . Cetirizine & related Other (See Comments) 08/17/2015  . Duloxetine Other (See Comments) 08/17/2015  . Fexofenadine Other (See Comments) 08/17/2015  . Haloperidol and related Other (See Comments) 08/17/2015  . Ipratropium-albuterol Other (See Comments) 08/17/2015  . Levothyroxine Other (See Comments) 08/17/2015  . Levothyroxine sodium Nausea And Vomiting 08/17/2015  . Metformin and related Nausea And Vomiting 08/17/2015  . Montelukast sodium Other (See Comments) 08/17/2015  . Omeprazole Nausea And Vomiting 08/17/2015  . Sitagliptin Nausea And Vomiting 08/17/2015  .  Ziprasidone hcl Other (See Comments) 08/17/2015  . Adhesive [tape] Hives and Rash 08/17/2015  . Bupropion Anxiety 08/17/2015    ROS:  General: Negative for anorexia, weight loss, fever, chills, fatigue, weakness. ENT: Negative for hoarseness, difficulty swallowing , nasal congestion. CV: Negative for chest pain, angina, palpitations, dyspnea on exertion, peripheral edema.  Respiratory: Negative for dyspnea at rest, dyspnea on exertion, cough, sputum, wheezing.  GI: See history of present illness. GU:  Negative for dysuria, hematuria, urinary incontinence, urinary frequency, nocturnal urination.  Endo: Negative for unusual weight change.    Physical Examination:   BP 109/73   Pulse 88   Temp 98.3 F (36.8 C) (Oral)   Ht 5\' 3"  (1.6 m)   Wt 210 lb (95.3 kg)   BMI 37.20  kg/m   General: Well-nourished, well-developed in no acute distress.  Eyes: No icterus. Conjunctivae pink. Mouth: Oropharyngeal mucosa moist and pink , no lesions erythema or exudate. Lungs: Clear to auscultation bilaterally. Non-labored. Heart: Regular rate and rhythm, no murmurs rubs or gallops.  Abdomen: Bowel sounds are normal, nontender, nondistended, no hepatosplenomegaly or masses, no abdominal bruits or hernia , no rebound or guarding.   Extremities: No lower extremity edema. No clubbing or deformities. Neuro: Alert and oriented x 3.  Grossly intact. Skin: Warm and dry, no jaundice.   Psych: Alert and cooperative, normal mood and affect.  Labs:    Imaging Studies: No results found.  Assessment and Plan:   Autymn Gazda is a 45 y.o. y/o female who has a history of fatty liver disease with a workup at [facility] that was reported to be negative.  The patient has normalized her liver enzymes when she loses weight and has been encouraged to try to lose weight to bring her liver enzymes down.  There is no need for any further investigation at this time.  His been told to follow-up in 3 months to assess her  progress with weight loss and her liver enzymes.  The patient has been explained the plan and agrees with it.   Note: This dictation was prepared with Dragon dictation along with smaller phrase technology. Any transcriptional errors that result from this process are unintentional.

## 2016-04-30 DIAGNOSIS — F29 Unspecified psychosis not due to a substance or known physiological condition: Secondary | ICD-10-CM | POA: Diagnosis not present

## 2016-04-30 DIAGNOSIS — F4481 Dissociative identity disorder: Secondary | ICD-10-CM | POA: Diagnosis not present

## 2016-04-30 DIAGNOSIS — F4310 Post-traumatic stress disorder, unspecified: Secondary | ICD-10-CM | POA: Diagnosis not present

## 2016-04-30 DIAGNOSIS — F3181 Bipolar II disorder: Secondary | ICD-10-CM | POA: Diagnosis not present

## 2016-05-05 DIAGNOSIS — M546 Pain in thoracic spine: Secondary | ICD-10-CM | POA: Diagnosis not present

## 2016-05-05 DIAGNOSIS — M545 Low back pain: Secondary | ICD-10-CM | POA: Diagnosis not present

## 2016-05-26 DIAGNOSIS — M797 Fibromyalgia: Secondary | ICD-10-CM | POA: Diagnosis not present

## 2016-05-26 DIAGNOSIS — G894 Chronic pain syndrome: Secondary | ICD-10-CM | POA: Diagnosis not present

## 2016-05-26 DIAGNOSIS — R531 Weakness: Secondary | ICD-10-CM | POA: Diagnosis not present

## 2016-05-28 DIAGNOSIS — G43109 Migraine with aura, not intractable, without status migrainosus: Secondary | ICD-10-CM | POA: Diagnosis not present

## 2016-05-28 DIAGNOSIS — G479 Sleep disorder, unspecified: Secondary | ICD-10-CM | POA: Diagnosis not present

## 2016-06-16 DIAGNOSIS — F4481 Dissociative identity disorder: Secondary | ICD-10-CM | POA: Diagnosis not present

## 2016-06-16 DIAGNOSIS — F4310 Post-traumatic stress disorder, unspecified: Secondary | ICD-10-CM | POA: Diagnosis not present

## 2016-06-16 DIAGNOSIS — F3181 Bipolar II disorder: Secondary | ICD-10-CM | POA: Diagnosis not present

## 2016-06-16 DIAGNOSIS — F29 Unspecified psychosis not due to a substance or known physiological condition: Secondary | ICD-10-CM | POA: Diagnosis not present

## 2016-06-18 DIAGNOSIS — G43109 Migraine with aura, not intractable, without status migrainosus: Secondary | ICD-10-CM | POA: Diagnosis not present

## 2016-06-18 DIAGNOSIS — G479 Sleep disorder, unspecified: Secondary | ICD-10-CM | POA: Diagnosis not present

## 2016-06-23 DIAGNOSIS — J4530 Mild persistent asthma, uncomplicated: Secondary | ICD-10-CM | POA: Diagnosis not present

## 2016-06-23 DIAGNOSIS — E7800 Pure hypercholesterolemia, unspecified: Secondary | ICD-10-CM | POA: Diagnosis not present

## 2016-06-23 DIAGNOSIS — B373 Candidiasis of vulva and vagina: Secondary | ICD-10-CM | POA: Diagnosis not present

## 2016-06-23 DIAGNOSIS — J3089 Other allergic rhinitis: Secondary | ICD-10-CM | POA: Diagnosis not present

## 2016-06-23 DIAGNOSIS — K760 Fatty (change of) liver, not elsewhere classified: Secondary | ICD-10-CM | POA: Diagnosis not present

## 2016-06-23 DIAGNOSIS — E039 Hypothyroidism, unspecified: Secondary | ICD-10-CM | POA: Diagnosis not present

## 2016-06-23 DIAGNOSIS — E119 Type 2 diabetes mellitus without complications: Secondary | ICD-10-CM | POA: Diagnosis not present

## 2016-06-26 DIAGNOSIS — E6609 Other obesity due to excess calories: Secondary | ICD-10-CM | POA: Diagnosis not present

## 2016-06-26 DIAGNOSIS — R0602 Shortness of breath: Secondary | ICD-10-CM | POA: Diagnosis not present

## 2016-06-26 DIAGNOSIS — J4520 Mild intermittent asthma, uncomplicated: Secondary | ICD-10-CM | POA: Diagnosis not present

## 2016-06-26 DIAGNOSIS — Z6832 Body mass index (BMI) 32.0-32.9, adult: Secondary | ICD-10-CM | POA: Diagnosis not present

## 2016-07-01 ENCOUNTER — Ambulatory Visit: Payer: PPO | Attending: Neurology

## 2016-07-07 DIAGNOSIS — E11649 Type 2 diabetes mellitus with hypoglycemia without coma: Secondary | ICD-10-CM | POA: Diagnosis not present

## 2016-07-07 DIAGNOSIS — E669 Obesity, unspecified: Secondary | ICD-10-CM | POA: Diagnosis not present

## 2016-07-07 DIAGNOSIS — Z6837 Body mass index (BMI) 37.0-37.9, adult: Secondary | ICD-10-CM | POA: Diagnosis not present

## 2016-07-13 DIAGNOSIS — F3181 Bipolar II disorder: Secondary | ICD-10-CM | POA: Diagnosis not present

## 2016-07-13 DIAGNOSIS — F4310 Post-traumatic stress disorder, unspecified: Secondary | ICD-10-CM | POA: Diagnosis not present

## 2016-07-13 DIAGNOSIS — F29 Unspecified psychosis not due to a substance or known physiological condition: Secondary | ICD-10-CM | POA: Diagnosis not present

## 2016-07-13 DIAGNOSIS — F4481 Dissociative identity disorder: Secondary | ICD-10-CM | POA: Diagnosis not present

## 2016-07-14 DIAGNOSIS — G894 Chronic pain syndrome: Secondary | ICD-10-CM | POA: Diagnosis not present

## 2016-07-14 DIAGNOSIS — M797 Fibromyalgia: Secondary | ICD-10-CM | POA: Diagnosis not present

## 2016-07-14 DIAGNOSIS — F319 Bipolar disorder, unspecified: Secondary | ICD-10-CM | POA: Diagnosis not present

## 2016-07-21 ENCOUNTER — Ambulatory Visit: Payer: PPO | Attending: Neurology

## 2016-07-21 DIAGNOSIS — R0683 Snoring: Secondary | ICD-10-CM | POA: Insufficient documentation

## 2016-07-21 DIAGNOSIS — G4733 Obstructive sleep apnea (adult) (pediatric): Secondary | ICD-10-CM | POA: Diagnosis not present

## 2016-07-22 DIAGNOSIS — G4733 Obstructive sleep apnea (adult) (pediatric): Secondary | ICD-10-CM | POA: Diagnosis not present

## 2016-07-27 DIAGNOSIS — Z79899 Other long term (current) drug therapy: Secondary | ICD-10-CM | POA: Diagnosis not present

## 2016-08-10 DIAGNOSIS — E039 Hypothyroidism, unspecified: Secondary | ICD-10-CM | POA: Diagnosis not present

## 2016-08-10 DIAGNOSIS — E282 Polycystic ovarian syndrome: Secondary | ICD-10-CM | POA: Diagnosis not present

## 2016-08-10 DIAGNOSIS — E11649 Type 2 diabetes mellitus with hypoglycemia without coma: Secondary | ICD-10-CM | POA: Diagnosis not present

## 2016-08-11 DIAGNOSIS — M797 Fibromyalgia: Secondary | ICD-10-CM | POA: Diagnosis not present

## 2016-08-11 DIAGNOSIS — E559 Vitamin D deficiency, unspecified: Secondary | ICD-10-CM | POA: Diagnosis not present

## 2016-08-11 DIAGNOSIS — J3089 Other allergic rhinitis: Secondary | ICD-10-CM | POA: Diagnosis not present

## 2016-08-11 DIAGNOSIS — J302 Other seasonal allergic rhinitis: Secondary | ICD-10-CM | POA: Diagnosis not present

## 2016-08-11 DIAGNOSIS — M810 Age-related osteoporosis without current pathological fracture: Secondary | ICD-10-CM | POA: Diagnosis not present

## 2016-08-11 DIAGNOSIS — G894 Chronic pain syndrome: Secondary | ICD-10-CM | POA: Diagnosis not present

## 2016-08-11 DIAGNOSIS — E8940 Asymptomatic postprocedural ovarian failure: Secondary | ICD-10-CM | POA: Diagnosis not present

## 2016-08-11 DIAGNOSIS — J301 Allergic rhinitis due to pollen: Secondary | ICD-10-CM | POA: Diagnosis not present

## 2016-08-11 DIAGNOSIS — J4540 Moderate persistent asthma, uncomplicated: Secondary | ICD-10-CM | POA: Diagnosis not present

## 2016-08-11 DIAGNOSIS — Z79899 Other long term (current) drug therapy: Secondary | ICD-10-CM | POA: Diagnosis not present

## 2016-08-27 ENCOUNTER — Other Ambulatory Visit (HOSPITAL_COMMUNITY): Payer: Self-pay

## 2016-08-27 ENCOUNTER — Other Ambulatory Visit: Payer: Self-pay

## 2016-08-27 DIAGNOSIS — R945 Abnormal results of liver function studies: Secondary | ICD-10-CM

## 2016-09-01 ENCOUNTER — Ambulatory Visit: Admission: RE | Admit: 2016-09-01 | Payer: PPO | Source: Ambulatory Visit

## 2016-09-01 ENCOUNTER — Ambulatory Visit
Admission: RE | Admit: 2016-09-01 | Discharge: 2016-09-01 | Disposition: A | Discharge status: Home / Self Care | Payer: PPO | Source: Ambulatory Visit | Attending: Specialist | Admitting: Specialist

## 2016-09-01 DIAGNOSIS — K760 Fatty (change of) liver, not elsewhere classified: Secondary | ICD-10-CM | POA: Insufficient documentation

## 2016-09-01 DIAGNOSIS — R945 Abnormal results of liver function studies: Secondary | ICD-10-CM | POA: Diagnosis not present

## 2016-09-02 ENCOUNTER — Encounter: Payer: Self-pay | Admitting: Gastroenterology

## 2016-09-02 ENCOUNTER — Ambulatory Visit (INDEPENDENT_AMBULATORY_CARE_PROVIDER_SITE_OTHER): Payer: PPO | Admitting: Gastroenterology

## 2016-09-02 VITALS — BP 104/66 | HR 82 | Ht 63.0 in | Wt 217.0 lb

## 2016-09-02 DIAGNOSIS — R748 Abnormal levels of other serum enzymes: Secondary | ICD-10-CM

## 2016-09-02 NOTE — Patient Instructions (Addendum)
You have been scheduled for a tissue elastography at [facility] on Friday, Dec 29 th @ 8:30am. Please check in at the [facility] at 8:15am. You cannot have anything to eat or drink after midnight Thursday night. If you need to reschedule for any reason, please contact central scheduling at [phone].

## 2016-09-02 NOTE — Progress Notes (Signed)
Primary Care Physician: [facility], MD  Primary Gastroenterologist:  Dr. Lucilla Lame  Chief Complaint  Patient presents with  . Elevated Hepatic Enzymes    HPI: Stacy Daniel is a 45 y.o. female here for follow-up of abnormal liver enzymes.  The patient has had a history of abnormal liver enzymes.  The patient has had her liver enzymes improve when she has lost weight.  The patient now comes in today with a history of abnormal liver enzymes recently and a ultrasound that showed a possible slight irregularity of the liver consistent with questionable cirrhosis. The patient denies any nausea or vomiting. She also reports that she has been taking her diabetic medication incorrectly for the last month and she thinks this is the cause of her weight gain. I reviewed the ultrasound.  The patient also had an MRI back in 2011 that showed a lesion that was not seen on the most recent ultrasound.  Current Outpatient Prescriptions  Medication Sig Dispense Refill  . albuterol (PROVENTIL HFA;VENTOLIN HFA) 108 (90 BASE) MCG/ACT inhaler Inhale 2 puffs into the lungs every 6 (six) hours as needed for wheezing or shortness of breath.    . Ascorbic Acid (VITAMIN C ER PO) Take 120 mg by mouth.    Marland Kitchen b complex vitamins tablet Take 1 tablet by mouth daily.    . BD PEN NEEDLE NANO U/F 32G X 4 MM MISC     . cholecalciferol (VITAMIN D) 1000 units tablet Take 1,000 Units by mouth 2 (two) times daily.    . diphenhydrAMINE (BENADRYL) 50 MG capsule Take by mouth. Takes 25 mg/4 for angioedema    . EPINEPHrine 0.3 mg/0.3 mL IJ SOAJ injection Inject 0.3 mLs (0.3 mg total) into the muscle once. 1 Device 0  . estradiol (ESTRACE VAGINAL) 0.1 MG/GM vaginal cream Place 0.5 g vaginally 2 (two) times a week. 1/2 g twice a week intravaginal 42.5 g 2  . estradiol (ESTRACE) 1 MG tablet Take 1 tablet (1 mg total) by mouth daily. 90 tablet 3  . Ferrous Sulfate (IRON) 142 (45 Fe) MG TBCR Take by mouth.    . Flaxseed,  Linseed, (FLAX SEED OIL PO) Take by mouth.    . Flunisolide HFA 80 MCG/ACT AERS Inhale 1 puff into the lungs daily.    . folic acid (FOLVITE) A999333 MCG tablet Take 400 mcg by mouth daily. Reported on 02/18/2016    . gabapentin (NEURONTIN) 300 MG capsule Take 300 mg by mouth 4 (four) times daily.    Marland Kitchen ketoconazole (NIZORAL) 2 % shampoo     . levocetirizine (XYZAL) 5 MG tablet Take 5 mg by mouth every evening.    Marland Kitchen levothyroxine (SYNTHROID, LEVOTHROID) 75 MCG tablet Take 75 mcg by mouth at bedtime.    Marland Kitchen LORazepam (ATIVAN) 1 MG tablet Take 1 mg by mouth 2 (two) times daily. Take morning and night.    . Magnesium 250 MG TABS Take by mouth.    . metFORMIN (GLUCOPHAGE-XR) 500 MG 24 hr tablet Take 500 mg by mouth daily with breakfast. Take 2 tablets in the morning and 2 tablets at night.    . methylPREDNISolone (MEDROL) 4 MG tablet Day one - 6 tab Day two - 5 tab Day three - 4 tabs Day four - 3 tabs Day five - 2 tabs Day six - 1 tab    . montelukast (SINGULAIR) 10 MG tablet Take 10 mg by mouth at bedtime.    . Nebulizers ([place])  MISC U UTD  1  . niacin 500 MG tablet Take 500 mg by mouth at bedtime.    Marland Kitchen omeprazole (PRILOSEC) 20 MG capsule Take 20 mg by mouth daily. At night.    . ONE TOUCH ULTRA TEST test strip USE TO CHECK BLOOD SUGARS 2 TO 3 TIMES A DAY UTD FOR 90 DAYS  3  . oxyCODONE-acetaminophen (PERCOCET) 10-325 MG tablet Take by mouth.    . pentosan polysulfate (ELMIRON) 100 MG capsule Take 1 capsule (100 mg total) by mouth 3 (three) times daily. 90 capsule 11  . predniSONE (DELTASONE) 20 MG tablet Take 2 tablets (40 mg total) by mouth daily. (Patient taking differently: Take 40 mg by mouth daily as needed (allegies). ) 8 tablet 0  . PULMICORT FLEXHALER 180 MCG/ACT inhaler     . ranitidine (ZANTAC) 75 MG tablet Take 75 mg by mouth every morning.     Marland Kitchen RELPAX 40 MG tablet TK 1 T PO AT ONSET MAY REPEAT Q 2 H  NOT TO EXCEED 5 TS  D  4  . rizatriptan (MAXALT) 10 MG tablet TK 1 T  PO AOS OF HEADACHE. MAY REPEAT IN 2 H IF NO IMPROVEMENT. MAXIMUM OF 30 MG PER DAY    . Saccharomyces boulardii (PROBIOTIC) 250 MG CAPS Take by mouth.    . theophylline (THEO-24) 300 MG 24 hr capsule Take 300 mg by mouth daily.    Marland Kitchen thiothixene (NAVANE) 2 MG capsule Take 2 mg by mouth at bedtime.    Marland Kitchen tiZANidine (ZANAFLEX) 2 MG tablet Take 2 mg by mouth 3 (three) times daily. Take half tablet every 4 hours.    . topiramate (TOPAMAX) 100 MG tablet Take 100 mg by mouth daily. Take 4 tablets at night.    . valACYclovir (VALTREX) 1000 MG tablet Take 1,000 mg by mouth 3 (three) times daily.    Marland Kitchen venlafaxine XR (EFFEXOR-XR) 150 MG 24 hr capsule Take 150 mg by mouth at bedtime.    Marland Kitchen VICTOZA 18 MG/3ML SOPN     . vitamin B-12 (CYANOCOBALAMIN) 1000 MCG tablet Take by mouth.    Penne Lash HFA 45 MCG/ACT inhaler INL 1 TO 2 PFS PO Q 4 H PRN  0   No current facility-administered medications for this visit.     Allergies as of 09/02/2016 - Review Complete 09/02/2016  Allergen Reaction Noted  . Albuterol Anaphylaxis 08/17/2015  . Aripiprazole Anaphylaxis 08/17/2015  . Azelastine Anaphylaxis 08/17/2015  . Budesonide Anaphylaxis 08/17/2015  . Budesonide-formoterol fumarate Anaphylaxis 08/17/2015  . Ciclesonide Anaphylaxis 08/17/2015  . Fluticasone Anaphylaxis 08/17/2015  . Fluticasone furoate Anaphylaxis 08/17/2015  . Fluticasone-salmeterol Anaphylaxis 08/17/2015  . Gadolinium derivatives Anaphylaxis 08/17/2015  . Hydrocodone-acetaminophen Anaphylaxis 08/17/2015  . Iodine Anaphylaxis 08/17/2015  . Ivp dye [iodinated diagnostic agents] Anaphylaxis 08/17/2015  . Latex Anaphylaxis 08/17/2015  . Mometasone Anaphylaxis 08/17/2015  . Other Anaphylaxis 08/17/2015  . Peanut-containing drug products Anaphylaxis 08/17/2015  . Penicillins Anaphylaxis 08/17/2015  . Petrolatum Anaphylaxis 08/17/2015  . Tamsulosin Anaphylaxis 08/17/2015  . Tdap [diphth-acell pertussis-tetanus] Anaphylaxis 08/17/2015  .  Venlafaxine Other (See Comments) 08/17/2015  . Zinc oxide Anaphylaxis 08/17/2015  . Albuterol sulfate Other (See Comments) 08/17/2015  . Antihistamines, chlorpheniramine-type Other (See Comments) 08/17/2015  . Cetirizine & related Other (See Comments) 08/17/2015  . Duloxetine Other (See Comments) 08/17/2015  . Fexofenadine Other (See Comments) 08/17/2015  . Haloperidol and related Other (See Comments) 08/17/2015  . Ipratropium-albuterol Other (See Comments) 08/17/2015  . Levothyroxine Other (See Comments) 08/17/2015  .  Levothyroxine sodium Nausea And Vomiting 08/17/2015  . Metformin and related Nausea And Vomiting 08/17/2015  . Montelukast sodium Other (See Comments) 08/17/2015  . Omeprazole Nausea And Vomiting 08/17/2015  . Sitagliptin Nausea And Vomiting 08/17/2015  . Ziprasidone hcl Other (See Comments) 08/17/2015  . Adhesive [tape] Hives and Rash 08/17/2015  . Bupropion Anxiety 08/17/2015    ROS:  General: Negative for anorexia, weight loss, fever, chills, fatigue, weakness. ENT: Negative for hoarseness, difficulty swallowing , nasal congestion. CV: Negative for chest pain, angina, palpitations, dyspnea on exertion, peripheral edema.  Respiratory: Negative for dyspnea at rest, dyspnea on exertion, cough, sputum, wheezing.  GI: See history of present illness. GU:  Negative for dysuria, hematuria, urinary incontinence, urinary frequency, nocturnal urination.  Endo: Negative for unusual weight change.    Physical Examination:   BP 104/66   Pulse 82   Ht 5\' 3"  (1.6 m)   Wt 217 lb (98.4 kg)   BMI 38.44 kg/m   General: Well-nourished, well-developed in no acute distress.  Eyes: No icterus. Conjunctivae pink. Mouth: Oropharyngeal mucosa moist and pink , no lesions erythema or exudate. Lungs: Clear to auscultation bilaterally. Non-labored. Heart: Regular rate and rhythm, no murmurs rubs or gallops.  Abdomen: Bowel sounds are normal, nontender, nondistended, no  hepatosplenomegaly or masses, no abdominal bruits or hernia , no rebound or guarding.   Extremities: No lower extremity edema. No clubbing or deformities. Neuro: Alert and oriented x 3.  Grossly intact. Skin: Warm and dry, no jaundice.   Psych: Alert and cooperative, normal mood and affect.  Labs:    Imaging Studies: US Abdomen Limited Ruq  Result Date: 09/01/2016 CLINICAL DATA:  Abnormal liver function tests. EXAM: US ABDOMEN LIMITED - RIGHT UPPER QUADRANT COMPARISON:  08/29/2013 and CT abdomen pelvis 05/17/2013. MR abdomen 12/20/2009. FINDINGS: Gallbladder: No gallstones or wall thickening visualized. No sonographic Murphy sign noted by sonographer. Common bile duct: Diameter: 3 mm, within normal limits. Liver: Increased in echogenicity diffusely. Margin may be slightly irregular. Nodular lesion seen in the left hepatic lobe on 08/21/2010 is not readily visualized on the current exam. IMPRESSION: Hepatic steatosis.  Question cirrhosis. Electronically Signed   By: Lorin Picket M.D.   On: 09/01/2016 10:43    Assessment and Plan:   Stacy Daniel is a 45 y.o. y/o female who comes in today with continued abnormal liver enzymes.  The patient has not lost weight since I last saw her.  The patient's most recent ultrasound was suggestive of questionable cirrhosis.  The patient has been informed of this.  She will be set up for a fibrosis scan of her liver to see if there is suggestions of cirrhosis.  The patient has been encouraged again to lose weight.  The patient has also been told to avoid anti-inflammatory medications and excessive Tylenol use. She has also been told to avoid iron supplementation because of possible cirrhosis. The patient has been explained the plan and agrees with it.    Lucilla Lame, MD. Mar[facility] Regal   Note: This dictation was prepared with Dragon dictation along with smaller phrase technology. Any transcriptional errors that result from this process are unintentional.

## 2016-09-03 DIAGNOSIS — F4481 Dissociative identity disorder: Secondary | ICD-10-CM | POA: Diagnosis not present

## 2016-09-03 DIAGNOSIS — F4310 Post-traumatic stress disorder, unspecified: Secondary | ICD-10-CM | POA: Diagnosis not present

## 2016-09-03 DIAGNOSIS — F29 Unspecified psychosis not due to a substance or known physiological condition: Secondary | ICD-10-CM | POA: Diagnosis not present

## 2016-09-03 DIAGNOSIS — F3181 Bipolar II disorder: Secondary | ICD-10-CM | POA: Diagnosis not present

## 2016-09-04 ENCOUNTER — Ambulatory Visit
Admission: RE | Admit: 2016-09-04 | Discharge: 2016-09-04 | Disposition: A | Discharge status: Home / Self Care | Payer: PPO | Source: Ambulatory Visit | Attending: Gastroenterology | Admitting: Gastroenterology

## 2016-09-04 DIAGNOSIS — R748 Abnormal levels of other serum enzymes: Secondary | ICD-10-CM | POA: Insufficient documentation

## 2016-09-04 DIAGNOSIS — R945 Abnormal results of liver function studies: Secondary | ICD-10-CM | POA: Diagnosis not present

## 2016-09-10 ENCOUNTER — Ambulatory Visit (HOSPITAL_COMMUNITY): Payer: PPO

## 2016-09-11 ENCOUNTER — Telehealth: Payer: Self-pay

## 2016-09-11 NOTE — Telephone Encounter (Signed)
-----   Message from Lucilla Lame, MD sent at 09/09/2016  1:50 PM EST ----- Let the patient know that the scan showed F2-F3 and this means moderately advanced disease without cirrhosis.

## 2016-09-11 NOTE — Telephone Encounter (Signed)
Pt notified of US results.  

## 2016-09-14 ENCOUNTER — Ambulatory Visit: Payer: PPO | Admitting: Gastroenterology

## 2016-09-17 DIAGNOSIS — R2689 Other abnormalities of gait and mobility: Secondary | ICD-10-CM | POA: Diagnosis not present

## 2016-09-17 DIAGNOSIS — G479 Sleep disorder, unspecified: Secondary | ICD-10-CM | POA: Diagnosis not present

## 2016-09-17 DIAGNOSIS — G43109 Migraine with aura, not intractable, without status migrainosus: Secondary | ICD-10-CM | POA: Diagnosis not present

## 2016-09-21 ENCOUNTER — Other Ambulatory Visit: Payer: Self-pay | Admitting: Urology

## 2016-09-30 DIAGNOSIS — M797 Fibromyalgia: Secondary | ICD-10-CM | POA: Diagnosis not present

## 2016-09-30 DIAGNOSIS — G894 Chronic pain syndrome: Secondary | ICD-10-CM | POA: Diagnosis not present

## 2016-09-30 DIAGNOSIS — K7469 Other cirrhosis of liver: Secondary | ICD-10-CM | POA: Diagnosis not present

## 2016-09-30 DIAGNOSIS — R531 Weakness: Secondary | ICD-10-CM | POA: Diagnosis not present

## 2016-10-07 DIAGNOSIS — F3181 Bipolar II disorder: Secondary | ICD-10-CM | POA: Diagnosis not present

## 2016-10-07 DIAGNOSIS — F29 Unspecified psychosis not due to a substance or known physiological condition: Secondary | ICD-10-CM | POA: Diagnosis not present

## 2016-10-07 DIAGNOSIS — F4310 Post-traumatic stress disorder, unspecified: Secondary | ICD-10-CM | POA: Diagnosis not present

## 2016-10-07 DIAGNOSIS — F4481 Dissociative identity disorder: Secondary | ICD-10-CM | POA: Diagnosis not present

## 2016-10-14 ENCOUNTER — Ambulatory Visit: Payer: PPO | Admitting: Urology

## 2016-10-19 ENCOUNTER — Other Ambulatory Visit: Payer: Self-pay | Admitting: Urology

## 2016-10-23 ENCOUNTER — Other Ambulatory Visit: Payer: Self-pay | Admitting: Urology

## 2016-10-26 DIAGNOSIS — M79671 Pain in right foot: Secondary | ICD-10-CM | POA: Diagnosis not present

## 2016-10-26 DIAGNOSIS — S90121A Contusion of right lesser toe(s) without damage to nail, initial encounter: Secondary | ICD-10-CM | POA: Diagnosis not present

## 2016-10-26 DIAGNOSIS — M7061 Trochanteric bursitis, right hip: Secondary | ICD-10-CM | POA: Diagnosis not present

## 2016-10-27 ENCOUNTER — Ambulatory Visit: Payer: PPO | Admitting: Dietician

## 2016-10-28 DIAGNOSIS — R05 Cough: Secondary | ICD-10-CM | POA: Diagnosis not present

## 2016-11-05 NOTE — Progress Notes (Deleted)
11/06/2016 4:31 PM   Stacy Daniel 1971-08-29 [id number]  Referring provider: [name], MD [place], [place] [address]  No chief complaint on file.   HPI: 46 yo F with multiple medical/ psychiatric problems with interstitial cystitis and is Stacy Daniel who returns today for routine follow-up. She was last seen 1 year ago.    She was dx by Stacy Daniel from [place] (Urologist) approximately 15 years ago.  She was having dysuria, bladder pain, and frequency.  She underwent cystoscopy, hydrodistention as part of her diagnostic work up.  Records from his office have been requested but never received.    She has been taking Elmiron for 15 years, 100 mg bid which has been controlling her symptoms for the most part.  Note that her medicine seems to wear off in her dose was increased to 100 mg 3 times a day last visit.    She does have some urinary frequency and urinary leakage.  She denies stress incontinence or urge incontinence but notices that she has some moisture/wetness in her underwear each day. She is not sure where this is coming from. She does not need to wear pads or diapers and is not particularly bothered by this.  No gross hematuria.  No microscopic hematuria   She does have a history of kidney stones, last passed a kidney stone 4 years ago.  She thinks she has passed a total of 3 stones in her lifetime.    She is s/p abdominal hysterectomy/ oophorocystectomy for dysmenorrhea.  No children.  She not currently sexually active and does have pain with intercourse.    No issues with UTIs.      PMH: Past Medical History:  Diagnosis Date  . Angioedema   . Anxiety   . Bipolar 1 disorder ([place])   . Borderline personality disorder   . Breast pain in female    chronic, right  . Chronic bronchitis ([place])   . COPD (chronic obstructive pulmonary disease) ([place])   . Diabetes mellitus without complication ([place])   . Fibromyalgia     . Hypoglycemia   . IC (interstitial cystitis)   . OCD (obsessive compulsive disorder)   . Osteoarthritis   . Osteoarthritis   . PCOS (polycystic ovarian syndrome)   . Post traumatic stress disorder   . Rheumatoid arthritis ([place])   . Rheumatoid arthritis ([place])   . Schizoaffective disorder, bipolar type ([place])   . Shingles (herpes zoster) polyneuropathy   . Stroke [facility]) 02/14/16   TIA    Surgical History: Past Surgical History:  Procedure Laterality Date  . ABDOMINAL HYSTERECTOMY  06/1997   with left ovarectomy  . GANGLION CYST EXCISION  11/2010   right wrist  . OOPHORECTOMY Right 1995  . WISDOM TOOTH EXTRACTION  1988    Home Medications:  Allergies as of 11/06/2016      Reactions   Albuterol Anaphylaxis   Aripiprazole Anaphylaxis   Azelastine Anaphylaxis   Budesonide Anaphylaxis   Budesonide-formoterol Fumarate Anaphylaxis   Ciclesonide Anaphylaxis   Fluticasone Anaphylaxis   Fluticasone Furoate Anaphylaxis   Fluticasone-salmeterol Anaphylaxis   Gadolinium Derivatives Anaphylaxis   Hydrocodone-acetaminophen Anaphylaxis   Iodine Anaphylaxis   And iodide containing products   Ivp Dye [iodinated Diagnostic Agents] Anaphylaxis   Latex Anaphylaxis   Mometasone Anaphylaxis   Other Anaphylaxis   Petrochemical products   Peanut-containing Drug Products Anaphylaxis   Penicillins Anaphylaxis   Petrolatum Anaphylaxis   Yellow  Tamsulosin Anaphylaxis   Tdap [diphth-acell Pertussis-tetanus] Anaphylaxis   Venlafaxine Other (See Comments)   Migraine, causes eye to close   Zinc Oxide Anaphylaxis   Albuterol Sulfate Other (See Comments)   Gi distress   Antihistamines, Chlorpheniramine-type Other (See Comments)   Gi distress   Cetirizine & Related Other (See Comments)   Gi distress   Duloxetine Other (See Comments)   Gi distress   Fexofenadine Other (See Comments)   Gi distress   Haloperidol And Related Other (See Comments)   Gi distress   Ipratropium-albuterol Other  (See Comments)   Gi distress   Levothyroxine Other (See Comments)   Gi distress   Levothyroxine Sodium Nausea And Vomiting   Metformin And Related Nausea And Vomiting   Montelukast Sodium Other (See Comments)   Gi distress   Omeprazole Nausea And Vomiting   Sitagliptin Nausea And Vomiting   Ziprasidone Hcl Other (See Comments)   Gi distress   Adhesive [tape] Hives, Rash   Bupropion Anxiety      Medication List       Accurate as of 11/05/16  4:31 PM. Always use your most recent med list.          albuterol 108 (90 Base) MCG/ACT inhaler Commonly known as:  PROVENTIL HFA;VENTOLIN HFA Inhale 2 puffs into the lungs every 6 (six) hours as needed for wheezing or shortness of breath.   b complex vitamins tablet Take 1 tablet by mouth daily.   BD PEN NEEDLE NANO U/F 32G X 4 MM Misc Generic drug:  Insulin Pen Needle   cholecalciferol 1000 units tablet Commonly known as:  VITAMIN D Take 1,000 Units by mouth 2 (two) times daily.   diphenhydrAMINE 50 MG capsule Commonly known as:  BENADRYL Take by mouth. Takes 25 mg/4 for angioedema   ELMIRON 100 MG capsule Generic drug:  pentosan polysulfate TAKE ONE CAPSULE BY MOUTH THREE TIMES DAILY   ELMIRON 100 MG capsule Generic drug:  pentosan polysulfate TAKE ONE CAPSULE BY MOUTH THREE TIMES DAILY   EPINEPHrine 0.3 mg/0.3 mL Soaj injection Commonly known as:  EPI-PEN Inject 0.3 mLs (0.3 mg total) into the muscle once.   estradiol 0.1 MG/GM vaginal cream Commonly known as:  ESTRACE VAGINAL Place 0.5 g vaginally 2 (two) times a week. 1/2 g twice a week intravaginal   estradiol 1 MG tablet Commonly known as:  ESTRACE Take 1 tablet (1 mg total) by mouth daily.   FLAX SEED OIL PO Take by mouth.   Flunisolide HFA 80 MCG/ACT Aers Inhale 1 puff into the lungs daily.   folic acid 696 MCG tablet Commonly known as:  FOLVITE Take 400 mcg by mouth daily. Reported on 02/18/2016   gabapentin 300 MG capsule Commonly known as:   NEURONTIN Take 300 mg by mouth 4 (four) times daily.   Iron 142 (45 Fe) MG Tbcr Take by mouth.   ketoconazole 2 % shampoo Commonly known as:  NIZORAL   levocetirizine 5 MG tablet Commonly known as:  XYZAL Take 5 mg by mouth every evening.   levothyroxine 75 MCG tablet Commonly known as:  SYNTHROID, LEVOTHROID Take 75 mcg by mouth at bedtime.   LORazepam 1 MG tablet Commonly known as:  ATIVAN Take 1 mg by mouth 2 (two) times daily. Take morning and night.   Magnesium 250 MG Tabs Take by mouth.   metFORMIN 500 MG 24 hr tablet Commonly known as:  GLUCOPHAGE-XR Take 500 mg by mouth daily with breakfast. Take 2 tablets in  the morning and 2 tablets at night.   methylPREDNISolone 4 MG tablet Commonly known as:  MEDROL Day one - 6 tab Day two - 5 tab Day three - 4 tabs Day four - 3 tabs Day five - 2 tabs Day six - 1 tab   montelukast 10 MG tablet Commonly known as:  SINGULAIR Take 10 mg by mouth at bedtime.   niacin 500 MG tablet Take 500 mg by mouth at bedtime.   omeprazole 20 MG capsule Commonly known as:  PRILOSEC Take 20 mg by mouth daily. At night.   ONE TOUCH ULTRA TEST test strip Generic drug:  glucose blood USE TO CHECK BLOOD SUGARS 2 TO 3 TIMES A DAY UTD FOR 90 DAYS   oxyCODONE-acetaminophen 10-325 MG tablet Commonly known as:  PERCOCET Take by mouth.   predniSONE 20 MG tablet Commonly known as:  DELTASONE Take 2 tablets (40 mg total) by mouth daily.   Probiotic 250 MG Caps Take by mouth.   PULMICORT FLEXHALER 180 MCG/ACT inhaler Generic drug:  budesonide   ranitidine 75 MG tablet Commonly known as:  ZANTAC Take 75 mg by mouth every morning.   RELPAX 40 MG tablet Generic drug:  eletriptan TK 1 T PO AT ONSET MAY REPEAT Q 2 H  NOT TO EXCEED 5 TS  D   rizatriptan 10 MG tablet Commonly known as:  MAXALT TK 1 T PO AOS OF HEADACHE. MAY REPEAT IN 2 H IF NO IMPROVEMENT. MAXIMUM OF 30 MG PER DAY   theophylline 300 MG 24 hr capsule Commonly known as:   THEO-24 Take 300 mg by mouth daily.   thiothixene 2 MG capsule Commonly known as:  NAVANE Take 2 mg by mouth at bedtime.   tiZANidine 2 MG tablet Commonly known as:  ZANAFLEX Take 2 mg by mouth 3 (three) times daily. Take half tablet every 4 hours.   topiramate 100 MG tablet Commonly known as:  TOPAMAX Take 100 mg by mouth daily. Take 4 tablets at night.   valACYclovir 1000 MG tablet Commonly known as:  VALTREX Take 1,000 mg by mouth 3 (three) times daily.   venlafaxine XR 150 MG 24 hr capsule Commonly known as:  EFFEXOR-XR Take 150 mg by mouth at bedtime.   VICTOZA 18 MG/3ML Sopn Generic drug:  liraglutide   VIOS AEROSOL DELIVERY SYSTEM Misc U UTD   vitamin B-12 1000 MCG tablet Commonly known as:  CYANOCOBALAMIN Take by mouth.   VITAMIN C ER PO Take 120 mg by mouth.   XOPENEX HFA 45 MCG/ACT inhaler Generic drug:  levalbuterol INL 1 TO 2 PFS PO Q 4 H PRN       Allergies:  Allergies  Allergen Reactions  . Albuterol Anaphylaxis  . Aripiprazole Anaphylaxis  . Azelastine Anaphylaxis  . Budesonide Anaphylaxis  . Budesonide-Formoterol Fumarate Anaphylaxis  . Ciclesonide Anaphylaxis  . Fluticasone Anaphylaxis  . Fluticasone Furoate Anaphylaxis  . Fluticasone-Salmeterol Anaphylaxis  . Gadolinium Derivatives Anaphylaxis  . Hydrocodone-Acetaminophen Anaphylaxis  . Iodine Anaphylaxis    And iodide containing products   . Ivp Dye [Iodinated Diagnostic Agents] Anaphylaxis  . Latex Anaphylaxis  . Mometasone Anaphylaxis  . Other Anaphylaxis    Petrochemical products  . Peanut-Containing Drug Products Anaphylaxis  . Penicillins Anaphylaxis  . Petrolatum Anaphylaxis    Yellow   . Tamsulosin Anaphylaxis  . Tdap [Diphth-Acell Pertussis-Tetanus] Anaphylaxis  . Venlafaxine Other (See Comments)    Migraine, causes eye to close  . Zinc Oxide Anaphylaxis  . Albuterol Sulfate  Other (See Comments)    Gi distress   . Antihistamines, Chlorpheniramine-Type Other (See  Comments)    Gi distress   . Cetirizine & Related Other (See Comments)    Gi distress   . Duloxetine Other (See Comments)    Gi distress   . Fexofenadine Other (See Comments)    Gi distress   . Haloperidol And Related Other (See Comments)    Gi distress   . Ipratropium-Albuterol Other (See Comments)    Gi distress   . Levothyroxine Other (See Comments)    Gi distress   . Levothyroxine Sodium Nausea And Vomiting  . Metformin And Related Nausea And Vomiting  . Montelukast Sodium Other (See Comments)    Gi distress   . Omeprazole Nausea And Vomiting  . Sitagliptin Nausea And Vomiting  . Ziprasidone Hcl Other (See Comments)    Gi distress   . Adhesive [Tape] Hives and Rash  . Bupropion Anxiety    Family History: Family History  Problem Relation Age of Onset  . Stroke Paternal Aunt   . Diabetes Father   . Obesity Father   . Hyperthyroidism Mother   . Cancer Sister     skin  . Osteoporosis Maternal Grandmother   . Hyperthyroidism Maternal Grandmother   . Bipolar disorder Maternal Grandmother   . Heart attack Maternal Grandfather   . Diabetes Paternal Grandmother   . Heart attack Paternal Grandfather   . Breast cancer Paternal Aunt   . Diabetes Paternal Aunt   . Breast cancer Cousin   . Diabetes Cousin   . Bipolar disorder Cousin   . Bladder Cancer Neg Hx   . Prostate cancer Neg Hx   . Kidney cancer Neg Hx   . Ovarian cancer Neg Hx   . Colon cancer Neg Hx   . Heart disease Neg Hx     Social History:  reports that she has never smoked. She has never used smokeless tobacco. She reports that she does not drink alcohol or use drugs.  ROS:                                        Physical Exam: There were no vitals taken for this visit.  Constitutional:  Alert and oriented, No acute distress. HEENT: [place] AT, moist mucus membranes.  Trachea midline, no masses. Cardiovascular: No clubbing, cyanosis, or edema. Respiratory: Normal  respiratory effort, no increased work of breathing. GI: Abdomen is soft, nontender, nondistended, no abdominal masses.  Obese.  GU: No CVA tenderness.  Skin: No rashes, bruises or suspicious lesions. Lymph: No cervical or inguinal adenopathy. Neurologic: Grossly intact, no focal deficits, moving all 4 extremities. Psychiatric: Normal mood and affect.  Laboratory Data: Comprehensive Metabolic Panel (CMP) (48/09/6551 8:19 AM) Comprehensive Metabolic Panel (CMP) (74/82/7078 8:19 AM)  Component Value Ref Range  Glucose 102 70 - 110 mg/dL  Sodium 141 136 - 145 mmol/L  Potassium 4.1 3.6 - 5.1 mmol/L  Chloride 108 97 - 109 mmol/L  Carbon Dioxide (CO2) 24.8 22.0 - 32.0 mmol/L  Urea Nitrogen (BUN) 10 7 - 25 mg/dL  Creatinine 0.6 0.6 - 1.1 mg/dL  Glomerular Filtration Rate (eGFR), MDRD Estimate 108 >60 mL/min/1.73sq m   A1c 5.3 on 05/2015    Urinalysis Results for orders placed or performed during the hospital encounter of 67/54/49  Basic metabolic panel  Result Value Ref Range   Sodium 140 135 -  145 mmol/L   Potassium 3.7 3.5 - 5.1 mmol/L   Chloride 109 101 - 111 mmol/L   CO2 23 22 - 32 mmol/L   Glucose, Bld 80 65 - 99 mg/dL   BUN 5 (L) 6 - 20 mg/dL   Creatinine, Ser 0.57 0.44 - 1.00 mg/dL   Calcium 9.3 8.9 - 10.3 mg/dL   GFR calc non Af Amer >60 >60 mL/min   GFR calc Af Amer >60 >60 mL/min   Anion gap 8 5 - 15  CBC  Result Value Ref Range   WBC 7.8 4.0 - 10.5 K/uL   RBC 5.00 3.87 - 5.11 MIL/uL   Hemoglobin 14.6 12.0 - 15.0 g/dL   HCT 45.1 36.0 - 46.0 %   MCV 90.2 78.0 - 100.0 fL   MCH 29.2 26.0 - 34.0 pg   MCHC 32.4 30.0 - 36.0 g/dL   RDW 12.9 11.5 - 15.5 %   Platelets 248 150 - 400 K/uL  Protime-INR  Result Value Ref Range   Prothrombin Time 13.4 11.6 - 15.2 seconds   INR 1.00 0.00 - 1.49  APTT  Result Value Ref Range   aPTT 28 24 - 37 seconds  I-stat troponin, ED  Result Value Ref Range   Troponin i, poc 0.00 0.00 - 0.08 ng/mL   Comment 3          CBG  monitoring, ED  Result Value Ref Range   Glucose-Capillary 76 65 - 99 mg/dL    Pertinent Imaging: n/a  Assessment & Plan:    1. IC (interstitial cystitis) Increase dose [place]on to 100 mg tid  Discussed IC diet and literature given Records from Urologist in [place] requested RTC sooner if experiences IC flair - Urinalysis, Complete  2. Dyspareunia, female Discussed options for management including increased lubrication and pre-intercourse vaginal valium.  Currently not sexually active.  She will contact our office if she is interested in pursuing this in the future.  No Follow-up on file.  Hollice Espy, MD  [facility] [address] [place], [place] [address] [phone]

## 2016-11-06 ENCOUNTER — Ambulatory Visit: Payer: PPO | Admitting: Urology

## 2016-11-07 ENCOUNTER — Other Ambulatory Visit: Payer: Self-pay | Admitting: Urology

## 2016-11-12 DIAGNOSIS — M545 Low back pain: Secondary | ICD-10-CM | POA: Diagnosis not present

## 2016-11-12 DIAGNOSIS — M797 Fibromyalgia: Secondary | ICD-10-CM | POA: Diagnosis not present

## 2016-11-12 DIAGNOSIS — G894 Chronic pain syndrome: Secondary | ICD-10-CM | POA: Diagnosis not present

## 2016-11-12 DIAGNOSIS — Z79891 Long term (current) use of opiate analgesic: Secondary | ICD-10-CM | POA: Diagnosis not present

## 2016-11-12 DIAGNOSIS — M542 Cervicalgia: Secondary | ICD-10-CM | POA: Diagnosis not present

## 2016-11-19 ENCOUNTER — Other Ambulatory Visit: Payer: Self-pay | Admitting: Urology

## 2016-11-19 NOTE — Telephone Encounter (Signed)
Pharmacy sent a refill for elmiron. Refill not given as pt has not been seen in a year.

## 2016-11-20 ENCOUNTER — Other Ambulatory Visit: Payer: Self-pay | Admitting: Family Medicine

## 2016-11-20 MED ORDER — PENTOSAN POLYSULFATE SODIUM 100 MG PO CAPS: 100.0000 mg | ORAL_CAPSULE | Freq: Three times a day (TID) | ORAL | 0 refills | Status: DC

## 2016-11-25 ENCOUNTER — Ambulatory Visit: Payer: PPO | Admitting: Urology

## 2016-11-30 DIAGNOSIS — F4481 Dissociative identity disorder: Secondary | ICD-10-CM | POA: Diagnosis not present

## 2016-11-30 DIAGNOSIS — F29 Unspecified psychosis not due to a substance or known physiological condition: Secondary | ICD-10-CM | POA: Diagnosis not present

## 2016-11-30 DIAGNOSIS — F4310 Post-traumatic stress disorder, unspecified: Secondary | ICD-10-CM | POA: Diagnosis not present

## 2016-11-30 DIAGNOSIS — F3181 Bipolar II disorder: Secondary | ICD-10-CM | POA: Diagnosis not present

## 2016-12-02 ENCOUNTER — Ambulatory Visit: Payer: PPO | Admitting: Urology

## 2016-12-02 DIAGNOSIS — M545 Low back pain: Secondary | ICD-10-CM | POA: Diagnosis not present

## 2016-12-02 DIAGNOSIS — J302 Other seasonal allergic rhinitis: Secondary | ICD-10-CM | POA: Diagnosis not present

## 2016-12-02 DIAGNOSIS — G894 Chronic pain syndrome: Secondary | ICD-10-CM | POA: Diagnosis not present

## 2016-12-02 DIAGNOSIS — M797 Fibromyalgia: Secondary | ICD-10-CM | POA: Diagnosis not present

## 2016-12-02 DIAGNOSIS — M810 Age-related osteoporosis without current pathological fracture: Secondary | ICD-10-CM | POA: Diagnosis not present

## 2016-12-02 DIAGNOSIS — J3089 Other allergic rhinitis: Secondary | ICD-10-CM | POA: Diagnosis not present

## 2016-12-02 DIAGNOSIS — J301 Allergic rhinitis due to pollen: Secondary | ICD-10-CM | POA: Diagnosis not present

## 2016-12-02 DIAGNOSIS — T782XXA Anaphylactic shock, unspecified, initial encounter: Secondary | ICD-10-CM | POA: Diagnosis not present

## 2016-12-09 ENCOUNTER — Ambulatory Visit: Payer: PPO | Admitting: Dietician

## 2016-12-11 ENCOUNTER — Encounter: Payer: Self-pay | Admitting: Dietician

## 2016-12-11 NOTE — Progress Notes (Signed)
Patient has cancelled her appointment which was for 12/09/16, as she is going to see another RD elsewhere. Sent notification to MD.

## 2016-12-22 DIAGNOSIS — E559 Vitamin D deficiency, unspecified: Secondary | ICD-10-CM | POA: Diagnosis not present

## 2016-12-22 DIAGNOSIS — K760 Fatty (change of) liver, not elsewhere classified: Secondary | ICD-10-CM | POA: Diagnosis not present

## 2016-12-22 DIAGNOSIS — J4530 Mild persistent asthma, uncomplicated: Secondary | ICD-10-CM | POA: Diagnosis not present

## 2016-12-22 DIAGNOSIS — E039 Hypothyroidism, unspecified: Secondary | ICD-10-CM | POA: Diagnosis not present

## 2016-12-22 DIAGNOSIS — E119 Type 2 diabetes mellitus without complications: Secondary | ICD-10-CM | POA: Diagnosis not present

## 2016-12-22 DIAGNOSIS — G43109 Migraine with aura, not intractable, without status migrainosus: Secondary | ICD-10-CM | POA: Diagnosis not present

## 2016-12-22 DIAGNOSIS — E7800 Pure hypercholesterolemia, unspecified: Secondary | ICD-10-CM | POA: Diagnosis not present

## 2016-12-22 DIAGNOSIS — J3089 Other allergic rhinitis: Secondary | ICD-10-CM | POA: Diagnosis not present

## 2016-12-24 NOTE — Progress Notes (Signed)
Patient ID: Stacy Daniel, female   DOB: September 13, 1970, 46 y.o.   MRN: [id number] ANNUAL PREVENTATIVE CARE GYN  ENCOUNTER NOTE  Subjective:       Stacy Daniel is a 46 y.o. G0P0000 female here for a routine annual gynecologic exam.  Current complaints: 1.  ? Continue with estardiol 1mg ? 2. Chronic yeast infection -under belly   Patient is on estradiol 1 mg a day and does not have vasomotor symptoms. She does experience vaginal dryness. She is not currently sexually active. Patient has had a persistent discharge without significant itching or burning. She did not try the Estrace cream prescribed last year. Patient is on calcium with vitamin D supplementation due to history of osteopenia and surgical menopause  Patient did not get follow-up mammogram from BI-RADS 4 mammogram last year. Will recommend diagnostic mammogram this year.   Gynecologic History No LMP recorded. Patient has had a hysterectomy. Contraception: status post hysterectomy TAH/BSO Last Pap: 12/2015 neg. Results were: normal Last mammogram: 12/2015 birad 4- Rt breast fibrocystic changes with calcifications and usual ductal hyperplasia- needs 6 month dx on the rt. Results were: normal  Obstetric History OB History  Gravida Para Term Preterm AB Living  0 0 0 0 0 0  SAB TAB Ectopic Multiple Live Births  0 0 0 0          Past Medical History:  Diagnosis Date  . Angioedema   . Anxiety   . Bipolar 1 disorder ([place])   . Borderline personality disorder   . Breast pain in female    chronic, right  . Chronic bronchitis ([place])   . COPD (chronic obstructive pulmonary disease) ([place])   . Diabetes mellitus without complication ([place])   . Fibromyalgia   . Hypoglycemia   . IC (interstitial cystitis)   . OCD (obsessive compulsive disorder)   . Osteoarthritis   . Osteoarthritis   . PCOS (polycystic ovarian syndrome)   . Post traumatic stress disorder   . Rheumatoid arthritis ([place])   . Rheumatoid arthritis ([place])   .  Schizoaffective disorder, bipolar type ([place])   . Shingles (herpes zoster) polyneuropathy   . Stroke [facility]) 02/14/16   TIA    Past Surgical History:  Procedure Laterality Date  . ABDOMINAL HYSTERECTOMY  06/1997   with left ovarectomy  . GANGLION CYST EXCISION  11/2010   right wrist  . OOPHORECTOMY Right 1995  . WISDOM TOOTH EXTRACTION  1988    Current Outpatient Prescriptions on File Prior to Visit  Medication Sig Dispense Refill  . albuterol (PROVENTIL HFA;VENTOLIN HFA) 108 (90 BASE) MCG/ACT inhaler Inhale 2 puffs into the lungs every 6 (six) hours as needed for wheezing or shortness of breath.    . Ascorbic Acid (VITAMIN C ER PO) Take 120 mg by mouth.    Marland Kitchen b complex vitamins tablet Take 1 tablet by mouth daily.    . BD PEN NEEDLE NANO U/F 32G X 4 MM MISC     . cholecalciferol (VITAMIN D) 1000 units tablet Take 1,000 Units by mouth 2 (two) times daily.    . diphenhydrAMINE (BENADRYL) 50 MG capsule Take by mouth. Takes 25 mg/4 for angioedema    . ELMIRON 100 MG capsule TAKE ONE CAPSULE BY MOUTH THREE TIMES DAILY 90 capsule 0  . EPINEPHrine 0.3 mg/0.3 mL IJ SOAJ injection Inject 0.3 mLs (0.3 mg total) into the muscle once. 1 Device 0  . estradiol (ESTRACE VAGINAL) 0.1 MG/GM vaginal cream Place 0.5 g vaginally 2 (two) times a  week. 1/2 g twice a week intravaginal 42.5 g 2  . estradiol (ESTRACE) 1 MG tablet Take 1 tablet (1 mg total) by mouth daily. 90 tablet 3  . Ferrous Sulfate (IRON) 142 (45 Fe) MG TBCR Take by mouth.    . Flaxseed, Linseed, (FLAX SEED OIL PO) Take by mouth.    . Flunisolide HFA 80 MCG/ACT AERS Inhale 1 puff into the lungs daily.    . folic acid (FOLVITE) 202 MCG tablet Take 400 mcg by mouth daily. Reported on 02/18/2016    . gabapentin (NEURONTIN) 300 MG capsule Take 300 mg by mouth 4 (four) times daily.    Marland Kitchen ketoconazole (NIZORAL) 2 % shampoo     . levocetirizine (XYZAL) 5 MG tablet Take 5 mg by mouth every evening.    Marland Kitchen levothyroxine (SYNTHROID, LEVOTHROID) 75 MCG  tablet Take 75 mcg by mouth at bedtime.    Marland Kitchen LORazepam (ATIVAN) 1 MG tablet Take 1 mg by mouth 2 (two) times daily. Take morning and night.    . Magnesium 250 MG TABS Take by mouth.    . metFORMIN (GLUCOPHAGE-XR) 500 MG 24 hr tablet Take 500 mg by mouth daily with breakfast. Take 2 tablets in the morning and 2 tablets at night.    . methylPREDNISolone (MEDROL) 4 MG tablet Day one - 6 tab Day two - 5 tab Day three - 4 tabs Day four - 3 tabs Day five - 2 tabs Day six - 1 tab    . montelukast (SINGULAIR) 10 MG tablet Take 10 mg by mouth at bedtime.    . Nebulizers (VIOS AEROSOL DELIVERY SYSTEM) MISC U UTD  1  . niacin 500 MG tablet Take 500 mg by mouth at bedtime.    Marland Kitchen omeprazole (PRILOSEC) 20 MG capsule Take 20 mg by mouth daily. At night.    . ONE TOUCH ULTRA TEST test strip USE TO CHECK BLOOD SUGARS 2 TO 3 TIMES A DAY UTD FOR 90 DAYS  3  . oxyCODONE-acetaminophen (PERCOCET) 10-325 MG tablet Take by mouth.    . pentosan polysulfate (ELMIRON) 100 MG capsule Take 1 capsule (100 mg total) by mouth 3 (three) times daily. 90 capsule 0  . predniSONE (DELTASONE) 20 MG tablet Take 2 tablets (40 mg total) by mouth daily. (Patient taking differently: Take 40 mg by mouth daily as needed (allegies). ) 8 tablet 0  . PULMICORT FLEXHALER 180 MCG/ACT inhaler     . ranitidine (ZANTAC) 75 MG tablet Take 75 mg by mouth every morning.     Marland Kitchen RELPAX 40 MG tablet TK 1 T PO AT ONSET MAY REPEAT Q 2 H  NOT TO EXCEED 5 TS  D  4  . rizatriptan (MAXALT) 10 MG tablet TK 1 T PO AOS OF HEADACHE. MAY REPEAT IN 2 H IF NO IMPROVEMENT. MAXIMUM OF 30 MG PER DAY    . Saccharomyces boulardii (PROBIOTIC) 250 MG CAPS Take by mouth.    . theophylline (THEO-24) 300 MG 24 hr capsule Take 300 mg by mouth daily.    Marland Kitchen thiothixene (NAVANE) 2 MG capsule Take 2 mg by mouth at bedtime.    Marland Kitchen tiZANidine (ZANAFLEX) 2 MG tablet Take 2 mg by mouth 3 (three) times daily. Take half tablet every 4 hours.    . topiramate (TOPAMAX) 100 MG tablet Take 100  mg by mouth daily. Take 4 tablets at night.    . valACYclovir (VALTREX) 1000 MG tablet Take 1,000 mg by mouth 3 (three) times daily.    Marland Kitchen  venlafaxine XR (EFFEXOR-XR) 150 MG 24 hr capsule Take 150 mg by mouth at bedtime.    Marland Kitchen VICTOZA 18 MG/3ML SOPN     . vitamin B-12 (CYANOCOBALAMIN) 1000 MCG tablet Take by mouth.    Penne Lash HFA 45 MCG/ACT inhaler INL 1 TO 2 PFS PO Q 4 H PRN  0   No current facility-administered medications on file prior to visit.     Allergies  Allergen Reactions  . Albuterol Anaphylaxis  . Aripiprazole Anaphylaxis  . Azelastine Anaphylaxis  . Budesonide Anaphylaxis  . Budesonide-Formoterol Fumarate Anaphylaxis  . Ciclesonide Anaphylaxis  . Fluticasone Anaphylaxis  . Fluticasone Furoate Anaphylaxis  . Fluticasone-Salmeterol Anaphylaxis  . Gadolinium Derivatives Anaphylaxis  . Hydrocodone-Acetaminophen Anaphylaxis  . Iodine Anaphylaxis    And iodide containing products   . Ivp Dye [Iodinated Diagnostic Agents] Anaphylaxis  . Latex Anaphylaxis  . Mometasone Anaphylaxis  . Other Anaphylaxis    Petrochemical products  . Peanut-Containing Drug Products Anaphylaxis  . Penicillins Anaphylaxis  . Petrolatum Anaphylaxis    Yellow   . Tamsulosin Anaphylaxis  . Tdap [Diphth-Acell Pertussis-Tetanus] Anaphylaxis  . Venlafaxine Other (See Comments)    Migraine, causes eye to close  . Zinc Oxide Anaphylaxis  . Albuterol Sulfate Other (See Comments)    Gi distress   . Antihistamines, Chlorpheniramine-Type Other (See Comments)    Gi distress   . Cetirizine & Related Other (See Comments)    Gi distress   . Duloxetine Other (See Comments)    Gi distress   . Fexofenadine Other (See Comments)    Gi distress   . Haloperidol And Related Other (See Comments)    Gi distress   . Ipratropium-Albuterol Other (See Comments)    Gi distress   . Levothyroxine Other (See Comments)    Gi distress   . Levothyroxine Sodium Nausea And Vomiting  . Metformin And Related  Nausea And Vomiting  . Montelukast Sodium Other (See Comments)    Gi distress   . Omeprazole Nausea And Vomiting  . Sitagliptin Nausea And Vomiting  . Ziprasidone Hcl Other (See Comments)    Gi distress   . Adhesive [Tape] Hives and Rash  . Bupropion Anxiety    Social History   Social History  . Marital status: Single    Spouse name: N/A  . Number of children: 0  . Years of education: 26   Occupational History  .      volunteers   Social History Main Topics  . Smoking status: Never Smoker  . Smokeless tobacco: Never Used  . Alcohol use No     Comment: quit 2011  . Drug use: No  . Sexual activity: Not Currently    Birth control/ protection: Surgical   Other Topics Concern  . Not on file   Social History Narrative   Lives with parents   Caffeine use- coffee 2 daily, soda 2 daily    Family History  Problem Relation Age of Onset  . Stroke Paternal Aunt   . Diabetes Father   . Obesity Father   . Hyperthyroidism Mother   . Cancer Sister     skin  . Osteoporosis Maternal Grandmother   . Hyperthyroidism Maternal Grandmother   . Bipolar disorder Maternal Grandmother   . Heart attack Maternal Grandfather   . Diabetes Paternal Grandmother   . Heart attack Paternal Grandfather   . Breast cancer Paternal Aunt   . Diabetes Paternal Aunt   . Breast cancer Cousin   . Diabetes  Cousin   . Bipolar disorder Cousin   . Bladder Cancer Neg Hx   . Prostate cancer Neg Hx   . Kidney cancer Neg Hx   . Ovarian cancer Neg Hx   . Colon cancer Neg Hx   . Heart disease Neg Hx     The following portions of the patient's history were reviewed and updated as appropriate: allergies, current medications, past family history, past medical history, past social history, past surgical history and problem list.  Review of Systems Review of Systems  Constitutional:       Occasional hot flash  HENT: Negative.   Eyes: Negative.   Respiratory: Negative.   Cardiovascular: Negative.    Gastrointestinal: Negative.        Bowel function normal  Genitourinary:       Urinary frequency and urgency, stable; history of interstitial cystitis  Skin: Positive for rash.       Chronic rash under her abdomen  Neurological: Negative.   Endo/Heme/Allergies: Negative.   Psychiatric/Behavioral: Negative.      Objective:   BP 134/86   Pulse (!) 102   Ht 5\' 3"  (1.6 m)   Wt 209 lb (94.8 kg)   BMI 37.02 kg/m  CONSTITUTIONAL: Well-developed, well-nourished female in no acute distress.  PSYCHIATRIC: Normal mood and affect. Normal behavior. Normal judgment and thought content. [place]: Alert and oriented to person, place, and time. Normal muscle tone coordination. No cranial nerve deficit noted. HENT:  Normocephalic, atraumatic, External right and left ear normal. Oropharynx is clear and moist EYES: Conjunctivae and EOM are normal.No scleral icterus.  NECK: Normal range of motion, supple, no masses.  Normal thyroid.  SKIN: Skin is warm and dry. No rash noted. Not diaphoretic. No erythema. No pallor. CARDIOVASCULAR: Normal heart rate noted, regular rhythm, no murmur. RESPIRATORY: Clear to auscultation bilaterally. Effort and breath sounds normal, no problems with respiration noted. BREASTS: Symmetric in size. No masses, skin changes, nipple drainage, or lymphadenopathy. ABDOMEN: Soft, normal bowel sounds, no distention noted.  No tenderness, rebound or guarding.  BLADDER: Normal PELVIC:  External Genitalia: Normal  BUS: Normal  Vagina: Moderate atrophy; good vault support; thin white discharge present  Cervix: Surgically absent  Uterus: Surgically absent  Adnexa: Normal  RV: External Exam NormaI, No Rectal Masses and Normal Sphincter tone  MUSCULOSKELETAL: Normal range of motion. No tenderness.  No cyanosis, clubbing, or edema. LYMPHATIC: No Axillary, Supraclavicular, or Inguinal Adenopathy.  PROCEDURE: Wet prep Normal saline-negative KOH-suspicious for yeast  Assessment:    Annual gynecologic examination 46 y.o. Contraception: status post hysterectomy TAH/BSO bmi- 37 Surgical menopause, asymptomatic on ERT therapy Vaginal atrophy, symptomatic Osteopenia History of recurrent yeast infections Interstitial cystitis   Plan:  Pap: not needed Mammogram: Ordered  Stool Guaiac Testing:  Not Indicated Labs: thru pcp Routine preventative health maintenance measures emphasized: Exercise/Diet/Weight control, Tobacco Warnings, Alcohol/Substance use risks and Safe Sex Estradiol 1 mg daily refills Trial of Estrace cream 1/2 g intravaginal twice a week Diflucan 150 mg orally weekly 4 Return in 6 weeks if vaginitis symptoms persist Return to [place], CMA  Brayton Mars, MD   Note: This dictation was prepared with Dragon dictation along with smaller phrase technology. Any transcriptional errors that result from this process are unintentional.

## 2016-12-29 ENCOUNTER — Other Ambulatory Visit: Payer: Self-pay | Admitting: Obstetrics and Gynecology

## 2016-12-29 ENCOUNTER — Encounter: Payer: Self-pay | Admitting: Obstetrics and Gynecology

## 2016-12-29 ENCOUNTER — Ambulatory Visit (INDEPENDENT_AMBULATORY_CARE_PROVIDER_SITE_OTHER): Payer: PPO | Admitting: Obstetrics and Gynecology

## 2016-12-29 VITALS — BP 134/86 | HR 102 | Ht 63.0 in | Wt 209.0 lb

## 2016-12-29 DIAGNOSIS — R928 Other abnormal and inconclusive findings on diagnostic imaging of breast: Secondary | ICD-10-CM

## 2016-12-29 DIAGNOSIS — B3731 Acute candidiasis of vulva and vagina: Secondary | ICD-10-CM

## 2016-12-29 DIAGNOSIS — R638 Other symptoms and signs concerning food and fluid intake: Secondary | ICD-10-CM | POA: Diagnosis not present

## 2016-12-29 DIAGNOSIS — E8940 Asymptomatic postprocedural ovarian failure: Secondary | ICD-10-CM

## 2016-12-29 DIAGNOSIS — B373 Candidiasis of vulva and vagina: Secondary | ICD-10-CM | POA: Diagnosis not present

## 2016-12-29 DIAGNOSIS — E669 Obesity, unspecified: Secondary | ICD-10-CM | POA: Diagnosis not present

## 2016-12-29 DIAGNOSIS — Z01419 Encounter for gynecological examination (general) (routine) without abnormal findings: Secondary | ICD-10-CM

## 2016-12-29 DIAGNOSIS — N3010 Interstitial cystitis (chronic) without hematuria: Secondary | ICD-10-CM

## 2016-12-29 DIAGNOSIS — M8580 Other specified disorders of bone density and structure, unspecified site: Secondary | ICD-10-CM

## 2016-12-29 DIAGNOSIS — E1169 Type 2 diabetes mellitus with other specified complication: Secondary | ICD-10-CM | POA: Diagnosis not present

## 2016-12-29 DIAGNOSIS — N952 Postmenopausal atrophic vaginitis: Secondary | ICD-10-CM

## 2016-12-29 MED ORDER — ESTRADIOL 0.1 MG/GM VA CREA
0.2500 | TOPICAL_CREAM | VAGINAL | 12 refills | Status: DC
Start: 2016-12-31 — End: 2017-12-30

## 2016-12-29 MED ORDER — ESTRADIOL 1 MG PO TABS: 1.0000 mg | ORAL_TABLET | Freq: Every day | ORAL | 3 refills | Status: DC

## 2016-12-29 MED ORDER — FLUCONAZOLE 150 MG PO TABS: 150.0000 mg | ORAL_TABLET | ORAL | 0 refills | Status: DC

## 2016-12-29 NOTE — Addendum Note (Signed)
Addended by: Elouise Munroe on: 12/29/2016 03:47 PM   Modules accepted: Orders

## 2016-12-29 NOTE — Patient Instructions (Signed)
1. No Pap smear needed 2. Mammogram ordered-diagnostic, due to non-follow-up on BI-RADS 4 mammogram last year 3. Calcium with vitamin D supplementation is encouraged 4. Estradiol 1 mg daily is to be continued 5. Estrace cream 1/2 g intravaginal twice weekly is really ordered 6. Continue with healthy eating, exercise, and controlled weight loss 7. Screening labs are obtained through primary care 8. Return in 1 year for annual exam 9. Diflucan 150 mg orally weekly 4 10. Return in 6 weeks if vaginal discharge persists   Health Maintenance for Postmenopausal Women Menopause is a normal process in which your reproductive ability comes to an end. This process happens gradually over a span of months to years, usually between the ages of 82 and 19. Menopause is complete when you have missed 12 consecutive menstrual periods. It is important to talk with your health care provider about some of the most common conditions that affect postmenopausal women, such as heart disease, cancer, and bone loss (osteoporosis). Adopting a healthy lifestyle and getting preventive care can help to promote your health and wellness. Those actions can also lower your chances of developing some of these common conditions. What should I know about menopause? During menopause, you may experience a number of symptoms, such as:  Moderate-to-severe hot flashes.  Night sweats.  Decrease in sex drive.  Mood swings.  Headaches.  Tiredness.  Irritability.  Memory problems.  Insomnia. Choosing to treat or not to treat menopausal changes is an individual decision that you make with your health care provider. What should I know about hormone replacement therapy and supplements? Hormone therapy products are effective for treating symptoms that are associated with menopause, such as hot flashes and night sweats. Hormone replacement carries certain risks, especially as you become older. If you are thinking about using  estrogen or estrogen with progestin treatments, discuss the benefits and risks with your health care provider. What should I know about heart disease and stroke? Heart disease, heart attack, and stroke become more likely as you age. This may be due, in part, to the hormonal changes that your body experiences during menopause. These can affect how your body processes dietary fats, triglycerides, and cholesterol. Heart attack and stroke are both medical emergencies. There are many things that you can do to help prevent heart disease and stroke:  Have your blood pressure checked at least every 1-2 years. High blood pressure causes heart disease and increases the risk of stroke.  If you are 69-64 years old, ask your health care provider if you should take aspirin to prevent a heart attack or a stroke.  Do not use any tobacco products, including cigarettes, chewing tobacco, or electronic cigarettes. If you need help quitting, ask your health care provider.  It is important to eat a healthy diet and maintain a healthy weight.  Be sure to include plenty of vegetables, fruits, low-fat dairy products, and lean protein.  Avoid eating foods that are high in solid fats, added sugars, or salt (sodium).  Get regular exercise. This is one of the most important things that you can do for your health.  Try to exercise for at least 150 minutes each week. The type of exercise that you do should increase your heart rate and make you sweat. This is known as moderate-intensity exercise.  Try to do strengthening exercises at least twice each week. Do these in addition to the moderate-intensity exercise.  Know your numbers.Ask your health care provider to check your cholesterol and your blood glucose.  Continue to have your blood tested as directed by your health care provider. What should I know about cancer screening? There are several types of cancer. Take the following steps to reduce your risk and to catch any  cancer development as early as possible. Breast Cancer  Practice breast self-awareness.  This means understanding how your breasts normally appear and feel.  It also means doing regular breast self-exams. Let your health care provider know about any changes, no matter how small.  If you are 40 or older, have a clinician do a breast exam (clinical breast exam or CBE) every year. Depending on your age, family history, and medical history, it may be recommended that you also have a yearly breast X-ray (mammogram).  If you have a family history of breast cancer, talk with your health care provider about genetic screening.  If you are at high risk for breast cancer, talk with your health care provider about having an MRI and a mammogram every year.  Breast cancer (BRCA) gene test is recommended for women who have family members with BRCA-related cancers. Results of the assessment will determine the need for genetic counseling and BRCA1 and for BRCA2 testing. BRCA-related cancers include these types:  Breast. This occurs in males or females.  Ovarian.  Tubal. This may also be called fallopian tube cancer.  Cancer of the abdominal or pelvic lining (peritoneal cancer).  Prostate.  Pancreatic. Cervical, Uterine, and Ovarian Cancer  Your health care provider may recommend that you be screened regularly for cancer of the pelvic organs. These include your ovaries, uterus, and vagina. This screening involves a pelvic exam, which includes checking for microscopic changes to the surface of your cervix (Pap test).  For women ages 21-65, health care providers may recommend a pelvic exam and a Pap test every three years. For women ages 51-65, they may recommend the Pap test and pelvic exam, combined with testing for human papilloma virus (HPV), every five years. Some types of HPV increase your risk of cervical cancer. Testing for HPV may also be done on women of any age who have unclear Pap test  results.  Other health care providers may not recommend any screening for nonpregnant women who are considered low risk for pelvic cancer and have no symptoms. Ask your health care provider if a screening pelvic exam is right for you.  If you have had past treatment for cervical cancer or a condition that could lead to cancer, you need Pap tests and screening for cancer for at least 20 years after your treatment. If Pap tests have been discontinued for you, your risk factors (such as having a new sexual partner) need to be reassessed to determine if you should start having screenings again. Some women have medical problems that increase the chance of getting cervical cancer. In these cases, your health care provider may recommend that you have screening and Pap tests more often.  If you have a family history of uterine cancer or ovarian cancer, talk with your health care provider about genetic screening.  If you have vaginal bleeding after reaching menopause, tell your health care provider.  There are currently no reliable tests available to screen for ovarian cancer. Lung Cancer  Lung cancer screening is recommended for adults 32-33 years old who are at high risk for lung cancer because of a history of smoking. A yearly low-dose CT scan of the lungs is recommended if you:  Currently smoke.  Have a history of at least 30  pack-years of smoking and you currently smoke or have quit within the past 15 years. A pack-year is smoking an average of one pack of cigarettes per day for one year. Yearly screening should:  Continue until it has been 15 years since you quit.  Stop if you develop a health problem that would prevent you from having lung cancer treatment. Colorectal Cancer  This type of cancer can be detected and can often be prevented.  Routine colorectal cancer screening usually begins at age 59 and continues through age 68.  If you have risk factors for colon cancer, your health care  provider may recommend that you be screened at an earlier age.  If you have a family history of colorectal cancer, talk with your health care provider about genetic screening.  Your health care provider may also recommend using home test kits to check for hidden blood in your stool.  A small camera at the end of a tube can be used to examine your colon directly (sigmoidoscopy or colonoscopy). This is done to check for the earliest forms of colorectal cancer.  Direct examination of the colon should be repeated every 5-10 years until age 55. However, if early forms of precancerous polyps or small growths are found or if you have a family history or genetic risk for colorectal cancer, you may need to be screened more often. Skin Cancer  Check your skin from head to toe regularly.  Monitor any moles. Be sure to tell your health care provider:  About any new moles or changes in moles, especially if there is a change in a mole's shape or color.  If you have a mole that is larger than the size of a pencil eraser.  If any of your family members has a history of skin cancer, especially at a young age, talk with your health care provider about genetic screening.  Always use sunscreen. Apply sunscreen liberally and repeatedly throughout the day.  Whenever you are outside, protect yourself by wearing long sleeves, pants, a wide-brimmed hat, and sunglasses. What should I know about osteoporosis? Osteoporosis is a condition in which bone destruction happens more quickly than new bone creation. After menopause, you may be at an increased risk for osteoporosis. To help prevent osteoporosis or the bone fractures that can happen because of osteoporosis, the following is recommended:  If you are 38-34 years old, get at least 1,000 mg of calcium and at least 600 mg of vitamin D per day.  If you are older than age 51 but younger than age 60, get at least 1,200 mg of calcium and at least 600 mg of vitamin D  per day.  If you are older than age 52, get at least 1,200 mg of calcium and at least 800 mg of vitamin D per day. Smoking and excessive alcohol intake increase the risk of osteoporosis. Eat foods that are rich in calcium and vitamin D, and do weight-bearing exercises several times each week as directed by your health care provider. What should I know about how menopause affects my mental health? Depression may occur at any age, but it is more common as you become older. Common symptoms of depression include:  Low or sad mood.  Changes in sleep patterns.  Changes in appetite or eating patterns.  Feeling an overall lack of motivation or enjoyment of activities that you previously enjoyed.  Frequent crying spells. Talk with your health care provider if you think that you are experiencing depression. What should  I know about immunizations? It is important that you get and maintain your immunizations. These include:  Tetanus, diphtheria, and pertussis (Tdap) booster vaccine.  Influenza every year before the flu season begins.  Pneumonia vaccine.  Shingles vaccine. Your health care provider may also recommend other immunizations. This information is not intended to replace advice given to you by your health care provider. Make sure you discuss any questions you have with your health care provider. Document Released: 10/16/2005 Document Revised: 03/13/2016 Document Reviewed: 05/28/2015 Elsevier Interactive Patient Education  2017 [employer].

## 2017-01-01 ENCOUNTER — Ambulatory Visit (INDEPENDENT_AMBULATORY_CARE_PROVIDER_SITE_OTHER): Payer: PPO | Admitting: Urology

## 2017-01-01 ENCOUNTER — Encounter: Payer: Self-pay | Admitting: Urology

## 2017-01-01 VITALS — BP 100/67 | HR 100 | Ht 63.0 in | Wt 208.0 lb

## 2017-01-01 DIAGNOSIS — K5903 Drug induced constipation: Secondary | ICD-10-CM

## 2017-01-01 DIAGNOSIS — N3010 Interstitial cystitis (chronic) without hematuria: Secondary | ICD-10-CM

## 2017-01-01 LAB — URINALYSIS, COMPLETE
BILIRUBIN UA: NEGATIVE
GLUCOSE, UA: NEGATIVE
KETONES UA: NEGATIVE
Leukocytes, UA: NEGATIVE
Nitrite, UA: NEGATIVE
PROTEIN UA: NEGATIVE
RBC UA: NEGATIVE
Urobilinogen, Ur: 0.2 mg/dL (ref 0.2–1.0)
pH, UA: 7 (ref 5.0–7.5)

## 2017-01-01 MED ORDER — DOCUSATE SODIUM 100 MG PO CAPS: 100.0000 mg | ORAL_CAPSULE | Freq: Two times a day (BID) | ORAL | 12 refills | Status: DC

## 2017-01-01 NOTE — Progress Notes (Signed)
01/01/2017 9:35 AM   Stacy Daniel Apr 16, 1971 [id number]  Referring provider: [name], MD [name], [name] [address]  Chief Complaint  Patient presents with  . Cystitis    1 year    HPI: 46 yo F with multiple medical/ psychiatric problems with interstitial cystitis who returns for annual follow-up. She was dx by Dr. Elba Daniel from [place] (Urologist) approximately 15 years ago.  She was having dysuria, bladder pain, and frequency.  She underwent cystoscopy, hydrodistention as part of her diagnostic work up.   Over the past year, she has only had a few IC fairs.  She is currently having a flair with pressure and bladder discomfort.  Over the past year, she has about an episode per month which last only about one day.  Overall, she is doing well.  She has been taking Elmiron for 15 years, 100 mg bid --> tid which has been controlling her symptoms for the most part.  She does take it on an empty stomach. She is also following an IC diet and notes improvement in her symptoms when she avoids trigger foods.  She does have some baseline urinary frequency and urinary leakage.  She denies stress incontinence or urge incontinence but notices that she has some moisture/wetness in her underwear each day. She is not sure where this is coming from. She does not need to wear pads or diapers and is not particularly bothered by this.  She reports today that she has been struggling with constipation given her chronic narcotics use managed by the pain Center. She does not take it to daily stool softener.  She does have a history of kidney stones, last passed a kidney stone 5 years ago.  She thinks she has passed a total of 3 stones in her lifetime.    No recent flan pain.     She is s/p abdominal hysterectomy/ oophorocystectomy for dysmenorrhea.  No children.  She not currently sexually active.    No issues with UTIs.  No gross hematuria.    PMH: Past Medical History:  Diagnosis Date    . Angioedema   . Anxiety   . Bipolar 1 disorder ([place])   . Borderline personality disorder   . Breast pain in female    chronic, right  . Chronic bronchitis ([place])   . COPD (chronic obstructive pulmonary disease) ([place])   . Diabetes mellitus without complication ([place])   . Fibromyalgia   . Hypoglycemia   . IC (interstitial cystitis)   . OCD (obsessive compulsive disorder)   . Osteoarthritis   . Osteoarthritis   . PCOS (polycystic ovarian syndrome)   . Post traumatic stress disorder   . Rheumatoid arthritis ([place])   . Rheumatoid arthritis ([place])   . Schizoaffective disorder, bipolar type ([place])   . Shingles (herpes zoster) polyneuropathy   . Stroke [facility]) 02/14/16   TIA    Surgical History: Past Surgical History:  Procedure Laterality Date  . ABDOMINAL HYSTERECTOMY  06/1997   with left ovarectomy  . GANGLION CYST EXCISION  11/2010   right wrist  . OOPHORECTOMY Right 1995  . WISDOM TOOTH EXTRACTION  1988    Home Medications:  Allergies as of 01/01/2017      Reactions   Albuterol Anaphylaxis   Aripiprazole Anaphylaxis   Azelastine Anaphylaxis   Budesonide Anaphylaxis   Budesonide-formoterol Fumarate Anaphylaxis   Ciclesonide Anaphylaxis   Fluticasone Anaphylaxis   Fluticasone Furoate Anaphylaxis   Fluticasone-salmeterol Anaphylaxis   Gadolinium Derivatives Anaphylaxis  Hydrocodone-acetaminophen Anaphylaxis   Iodine Anaphylaxis   And iodide containing products   Ivp Dye [iodinated Diagnostic Agents] Anaphylaxis   Latex Anaphylaxis   Mometasone Anaphylaxis   Other Anaphylaxis   Petrochemical products   Peanut-containing Drug Products Anaphylaxis   Penicillins Anaphylaxis   Petrolatum Anaphylaxis   Yellow   Tamsulosin Anaphylaxis   Tdap [diphth-acell Pertussis-tetanus] Anaphylaxis   Venlafaxine Other (See Comments)   Migraine, causes eye to close   Zinc Oxide Anaphylaxis   Albuterol Sulfate Other (See Comments)   Gi distress   Antihistamines, Chlorpheniramine-type  Other (See Comments)   Gi distress   Cetirizine & Related Other (See Comments)   Gi distress   Duloxetine Other (See Comments)   Gi distress   Fexofenadine Other (See Comments)   Gi distress   Haloperidol And Related Other (See Comments)   Gi distress   Ipratropium-albuterol Other (See Comments)   Gi distress   Ipratropium-albuterol Other (See Comments)   Levothyroxine Other (See Comments)   Gi distress   Levothyroxine Sodium Nausea And Vomiting   Metformin And Related Nausea And Vomiting   Montelukast Sodium Other (See Comments)   Gi distress   Omeprazole Nausea And Vomiting   Sitagliptin Nausea And Vomiting   Ziprasidone Hcl Other (See Comments)   Gi distress   Adhesive [tape] Hives, Rash   Bupropion Anxiety      Medication List       Accurate as of 01/01/17  9:35 AM. Always use your most recent med list.          albuterol 108 (90 Base) MCG/ACT inhaler Commonly known as:  PROVENTIL HFA;VENTOLIN HFA Inhale 2 puffs into the lungs every 6 (six) hours as needed for wheezing or shortness of breath.   b complex vitamins tablet Take 1 tablet by mouth daily.   BD PEN NEEDLE NANO U/F 32G X 4 MM Misc Generic drug:  Insulin Pen Needle   cholecalciferol 1000 units tablet Commonly known as:  VITAMIN D Take 1,000 Units by mouth 2 (two) times daily.   diphenhydrAMINE 50 MG capsule Commonly known as:  BENADRYL Take by mouth. Takes 25 mg/4 for angioedema   docusate sodium 100 MG capsule Commonly known as:  COLACE Take 1 capsule (100 mg total) by mouth 2 (two) times daily.   ELMIRON 100 MG capsule Generic drug:  pentosan polysulfate TAKE ONE CAPSULE BY MOUTH THREE TIMES DAILY   pentosan polysulfate 100 MG capsule Commonly known as:  ELMIRON Take 1 capsule (100 mg total) by mouth 3 (three) times daily.   EPINEPHrine 0.3 mg/0.3 mL Soaj injection Commonly known as:  EPI-PEN Inject 0.3 mLs (0.3 mg total) into the muscle once.   estradiol 0.1 MG/GM vaginal  cream Commonly known as:  ESTRACE VAGINAL Place 7.34 Applicatorfuls vaginally 2 (two) times a week.   estradiol 1 MG tablet Commonly known as:  ESTRACE Take 1 tablet (1 mg total) by mouth daily.   FLAX SEED OIL PO Take by mouth.   fluconazole 150 MG tablet Commonly known as:  DIFLUCAN Take 1 tablet (150 mg total) by mouth once a week. For 4 weeks   Flunisolide HFA 80 MCG/ACT Aers Inhale 1 puff into the lungs daily.   fluticasone 50 MCG/ACT nasal spray Commonly known as:  FLONASE Place into the nose.   folic acid 193 MCG tablet Commonly known as:  FOLVITE Take 400 mcg by mouth daily. Reported on 02/18/2016   gabapentin 300 MG capsule Commonly known as:  NEURONTIN Take  300 mg by mouth 4 (four) times daily.   Iron 142 (45 Fe) MG Tbcr Take by mouth.   ketoconazole 2 % shampoo Commonly known as:  NIZORAL   levocetirizine 5 MG tablet Commonly known as:  XYZAL Take 5 mg by mouth every evening.   levothyroxine 75 MCG tablet Commonly known as:  SYNTHROID, LEVOTHROID Take 75 mcg by mouth at bedtime.   LORazepam 1 MG tablet Commonly known as:  ATIVAN Take 1 mg by mouth 2 (two) times daily. Take morning and night.   Magnesium 250 MG Tabs Take by mouth.   metFORMIN 500 MG 24 hr tablet Commonly known as:  GLUCOPHAGE-XR Take 500 mg by mouth daily with breakfast. Take 2 tablets in the morning and 2 tablets at night.   methylPREDNISolone 4 MG tablet Commonly known as:  MEDROL Day one - 6 tab Day two - 5 tab Day three - 4 tabs Day four - 3 tabs Day five - 2 tabs Day six - 1 tab   montelukast 10 MG tablet Commonly known as:  SINGULAIR Take 10 mg by mouth at bedtime.   niacin 500 MG tablet Take 500 mg by mouth at bedtime.   omeprazole 20 MG capsule Commonly known as:  PRILOSEC Take 20 mg by mouth daily. At night.   ONE TOUCH ULTRA TEST test strip Generic drug:  glucose blood USE TO CHECK BLOOD SUGARS 2 TO 3 TIMES A DAY UTD FOR 90 DAYS   Oxycodone HCl 10 MG Tabs    predniSONE 20 MG tablet Commonly known as:  DELTASONE Take 2 tablets (40 mg total) by mouth daily.   Probiotic 250 MG Caps Take by mouth.   PULMICORT FLEXHALER 180 MCG/ACT inhaler Generic drug:  budesonide   ranitidine 75 MG tablet Commonly known as:  ZANTAC Take 75 mg by mouth every morning.   RELPAX 40 MG tablet Generic drug:  eletriptan TK 1 T PO AT ONSET MAY REPEAT Q 2 H  NOT TO EXCEED 5 TS  D   rizatriptan 10 MG tablet Commonly known as:  MAXALT TK 1 T PO AOS OF HEADACHE. MAY REPEAT IN 2 H IF NO IMPROVEMENT. MAXIMUM OF 30 MG PER DAY   theophylline 300 MG 24 hr capsule Commonly known as:  THEO-24 Take 300 mg by mouth daily.   thiothixene 2 MG capsule Commonly known as:  NAVANE Take 2 mg by mouth at bedtime.   tiZANidine 2 MG tablet Commonly known as:  ZANAFLEX Take 2 mg by mouth 3 (three) times daily. Take half tablet every 4 hours.   topiramate 100 MG tablet Commonly known as:  TOPAMAX Take 100 mg by mouth daily. Take 4 tablets at night.   valACYclovir 1000 MG tablet Commonly known as:  VALTREX Take 1,000 mg by mouth 3 (three) times daily.   venlafaxine XR 150 MG 24 hr capsule Commonly known as:  EFFEXOR-XR Take 150 mg by mouth at bedtime.   VICTOZA 18 MG/3ML Sopn Generic drug:  liraglutide   VIOS AEROSOL DELIVERY SYSTEM Misc U UTD   vitamin B-12 1000 MCG tablet Commonly known as:  CYANOCOBALAMIN Take by mouth.   VITAMIN C ER PO Take 120 mg by mouth.   XOPENEX HFA 45 MCG/ACT inhaler Generic drug:  levalbuterol INL 1 TO 2 PFS PO Q 4 H PRN       Allergies:  Allergies  Allergen Reactions  . Albuterol Anaphylaxis  . Aripiprazole Anaphylaxis  . Azelastine Anaphylaxis  . Budesonide Anaphylaxis  .  Budesonide-Formoterol Fumarate Anaphylaxis  . Ciclesonide Anaphylaxis  . Fluticasone Anaphylaxis  . Fluticasone Furoate Anaphylaxis  . Fluticasone-Salmeterol Anaphylaxis  . Gadolinium Derivatives Anaphylaxis  . Hydrocodone-Acetaminophen  Anaphylaxis  . Iodine Anaphylaxis    And iodide containing products   . Ivp Dye [Iodinated Diagnostic Agents] Anaphylaxis  . Latex Anaphylaxis  . Mometasone Anaphylaxis  . Other Anaphylaxis    Petrochemical products  . Peanut-Containing Drug Products Anaphylaxis  . Penicillins Anaphylaxis  . Petrolatum Anaphylaxis    Yellow   . Tamsulosin Anaphylaxis  . Tdap [Diphth-Acell Pertussis-Tetanus] Anaphylaxis  . Venlafaxine Other (See Comments)    Migraine, causes eye to close  . Zinc Oxide Anaphylaxis  . Albuterol Sulfate Other (See Comments)    Gi distress   . Antihistamines, Chlorpheniramine-Type Other (See Comments)    Gi distress   . Cetirizine & Related Other (See Comments)    Gi distress   . Duloxetine Other (See Comments)    Gi distress   . Fexofenadine Other (See Comments)    Gi distress   . Haloperidol And Related Other (See Comments)    Gi distress   . Ipratropium-Albuterol Other (See Comments)    Gi distress   . Ipratropium-Albuterol Other (See Comments)  . Levothyroxine Other (See Comments)    Gi distress   . Levothyroxine Sodium Nausea And Vomiting  . Metformin And Related Nausea And Vomiting  . Montelukast Sodium Other (See Comments)    Gi distress   . Omeprazole Nausea And Vomiting  . Sitagliptin Nausea And Vomiting  . Ziprasidone Hcl Other (See Comments)    Gi distress   . Adhesive [Tape] Hives and Rash  . Bupropion Anxiety    Family History: Family History  Problem Relation Age of Onset  . Stroke Paternal Aunt   . Diabetes Father   . Obesity Father   . Hyperthyroidism Mother   . Cancer Sister     skin  . Osteoporosis Maternal Grandmother   . Hyperthyroidism Maternal Grandmother   . Bipolar disorder Maternal Grandmother   . Heart attack Maternal Grandfather   . Diabetes Paternal Grandmother   . Heart attack Paternal Grandfather   . Breast cancer Paternal Aunt   . Diabetes Paternal Aunt   . Breast cancer Cousin   . Diabetes Cousin     . Bipolar disorder Cousin   . Bladder Cancer Neg Hx   . Prostate cancer Neg Hx   . Kidney cancer Neg Hx   . Ovarian cancer Neg Hx   . Colon cancer Neg Hx   . Heart disease Neg Hx     Social History:  reports that she has never smoked. She has never used smokeless tobacco. She reports that she does not drink alcohol or use drugs.  ROS: UROLOGY Frequent Urination?: Yes Hard to postpone urination?: No Burning/pain with urination?: No Get up at night to urinate?: Yes Leakage of urine?: No Urine stream starts and stops?: No Trouble starting stream?: No Do you have to strain to urinate?: No Blood in urine?: No Urinary tract infection?: No Sexually transmitted disease?: No Injury to kidneys or bladder?: No Painful intercourse?: Yes Weak stream?: No Currently pregnant?: No Vaginal bleeding?: No Last menstrual period?: hysterectomy  Gastrointestinal Nausea?: No Vomiting?: No Indigestion/heartburn?: No Diarrhea?: No Constipation?: Yes  Constitutional Fever: No Night sweats?: Yes Weight loss?: No Fatigue?: Yes  Skin Skin rash/lesions?: Yes Itching?: Yes  Eyes Blurred vision?: Yes Double vision?: No  Ears/Nose/Throat Sore throat?: No Sinus problems?: Yes  Hematologic/Lymphatic Swollen  glands?: No Easy bruising?: Yes  Cardiovascular Leg swelling?: Yes Chest pain?: No  Respiratory Cough?: No Shortness of breath?: Yes  Endocrine Excessive thirst?: Yes  Musculoskeletal Back pain?: Yes Joint pain?: Yes  Neurological Headaches?: Yes Dizziness?: Yes  Psychologic Depression?: Yes Anxiety?: Yes  Physical Exam: BP 100/67 (BP Location: Left Arm, Cuff Size: Large)   Pulse 100   Ht 5\' 3"  (1.6 m)   Wt 208 lb (94.3 kg)   BMI 36.85 kg/m   Constitutional:  Alert and oriented, No acute distress. HEENT: [place] AT, moist mucus membranes.  Trachea midline, no masses. Cardiovascular: No clubbing, cyanosis, or edema. Respiratory: Normal respiratory effort, no  increased work of breathing. GI: Abdomen is soft, nontender, nondistended, no abdominal masses.  Obese.  GU: No CVA tenderness.  Skin: No rashes, bruises or suspicious lesions. Neurologic: Grossly intact, no focal deficits, moving all 4 extremities. Psychiatric: Normal mood and affect.  Laboratory Data: Results for orders placed or performed during the hospital encounter of 97/94/80  Basic metabolic panel  Result Value Ref Range   Sodium 140 135 - 145 mmol/L   Potassium 3.7 3.5 - 5.1 mmol/L   Chloride 109 101 - 111 mmol/L   CO2 23 22 - 32 mmol/L   Glucose, Bld 80 65 - 99 mg/dL   BUN 5 (L) 6 - 20 mg/dL   Creatinine, Ser 0.57 0.44 - 1.00 mg/dL   Calcium 9.3 8.9 - 10.3 mg/dL   GFR calc non Af Amer >60 >60 mL/min   GFR calc Af Amer >60 >60 mL/min   Anion gap 8 5 - 15  CBC  Result Value Ref Range   WBC 7.8 4.0 - 10.5 K/uL   RBC 5.00 3.87 - 5.11 MIL/uL   Hemoglobin 14.6 12.0 - 15.0 g/dL   HCT 45.1 36.0 - 46.0 %   MCV 90.2 78.0 - 100.0 fL   MCH 29.2 26.0 - 34.0 pg   MCHC 32.4 30.0 - 36.0 g/dL   RDW 12.9 11.5 - 15.5 %   Platelets 248 150 - 400 K/uL  Protime-INR  Result Value Ref Range   Prothrombin Time 13.4 11.6 - 15.2 seconds   INR 1.00 0.00 - 1.49  APTT  Result Value Ref Range   aPTT 28 24 - 37 seconds  I-stat troponin, ED  Result Value Ref Range   Troponin i, poc 0.00 0.00 - 0.08 ng/mL   Comment 3          CBG monitoring, ED  Result Value Ref Range   Glucose-Capillary 76 65 - 99 mg/dL    UA: Pending, will call with any abnormality  Pertinent Imaging: n/a  Assessment & Plan:    1. IC (interstitial cystitis) Continue Elmeron to 100 mg tid  Reviewed IC diet  RTC sooner if experiences IC flair - Urinalysis, Complete  2. Constipation, drug induced Recommend colace bid    Return in about 1 year (around 01/01/2018) for annual follow up/ UA.  Hollice Espy, MD  [facility] [address] [place], [place] [address] [phone]

## 2017-01-05 ENCOUNTER — Other Ambulatory Visit: Payer: Self-pay | Admitting: Urology

## 2017-01-06 DIAGNOSIS — G894 Chronic pain syndrome: Secondary | ICD-10-CM | POA: Diagnosis not present

## 2017-01-06 DIAGNOSIS — M797 Fibromyalgia: Secondary | ICD-10-CM | POA: Diagnosis not present

## 2017-01-06 DIAGNOSIS — M545 Low back pain: Secondary | ICD-10-CM | POA: Diagnosis not present

## 2017-01-06 DIAGNOSIS — M25551 Pain in right hip: Secondary | ICD-10-CM | POA: Diagnosis not present

## 2017-01-11 ENCOUNTER — Ambulatory Visit
Admission: RE | Admit: 2017-01-11 | Discharge: 2017-01-11 | Disposition: A | Discharge status: Home / Self Care | Payer: PPO | Source: Ambulatory Visit | Attending: Obstetrics and Gynecology | Admitting: Obstetrics and Gynecology

## 2017-01-11 DIAGNOSIS — R928 Other abnormal and inconclusive findings on diagnostic imaging of breast: Secondary | ICD-10-CM

## 2017-01-11 DIAGNOSIS — R921 Mammographic calcification found on diagnostic imaging of breast: Secondary | ICD-10-CM | POA: Diagnosis not present

## 2017-02-02 DIAGNOSIS — E11649 Type 2 diabetes mellitus with hypoglycemia without coma: Secondary | ICD-10-CM | POA: Diagnosis not present

## 2017-02-03 ENCOUNTER — Telehealth: Payer: Self-pay | Admitting: Obstetrics and Gynecology

## 2017-02-03 NOTE — Telephone Encounter (Signed)
Patient was on medication - diflucan it didn't clear it up at all it is worse in her vaginal and she has thrush  Please call

## 2017-02-04 NOTE — Telephone Encounter (Signed)
Pt c/o of a white d/c. Pos itch and odor. LV she was given weekly diflucan x4. Last one taken Sunday.  Sx are better on her scalp only. She is taking caprylic acid orally with no relief. App made 02/10/2017 at 12:45.

## 2017-02-05 DIAGNOSIS — M545 Low back pain: Secondary | ICD-10-CM | POA: Diagnosis not present

## 2017-02-05 DIAGNOSIS — G894 Chronic pain syndrome: Secondary | ICD-10-CM | POA: Diagnosis not present

## 2017-02-05 DIAGNOSIS — M797 Fibromyalgia: Secondary | ICD-10-CM | POA: Diagnosis not present

## 2017-02-09 DIAGNOSIS — E039 Hypothyroidism, unspecified: Secondary | ICD-10-CM | POA: Diagnosis not present

## 2017-02-09 DIAGNOSIS — E282 Polycystic ovarian syndrome: Secondary | ICD-10-CM | POA: Diagnosis not present

## 2017-02-09 DIAGNOSIS — E669 Obesity, unspecified: Secondary | ICD-10-CM | POA: Diagnosis not present

## 2017-02-09 DIAGNOSIS — Z6837 Body mass index (BMI) 37.0-37.9, adult: Secondary | ICD-10-CM | POA: Diagnosis not present

## 2017-02-09 DIAGNOSIS — E11649 Type 2 diabetes mellitus with hypoglycemia without coma: Secondary | ICD-10-CM | POA: Diagnosis not present

## 2017-02-10 ENCOUNTER — Encounter: Payer: Self-pay | Admitting: Obstetrics and Gynecology

## 2017-02-10 ENCOUNTER — Ambulatory Visit (INDEPENDENT_AMBULATORY_CARE_PROVIDER_SITE_OTHER): Payer: PPO | Admitting: Obstetrics and Gynecology

## 2017-02-10 VITALS — BP 104/72 | HR 103 | Ht 63.0 in | Wt 204.1 lb

## 2017-02-10 DIAGNOSIS — N898 Other specified noninflammatory disorders of vagina: Secondary | ICD-10-CM | POA: Diagnosis not present

## 2017-02-10 NOTE — Progress Notes (Signed)
Chief complaint: 1. Vaginal discharge 2. History of recurrent yeast infection  Stacy Daniel presents today for evaluation of possible recurrent Monilia vaginitis. She has completed 4 weeks of Diflucan therapy (weekly). She notes occasional vulvar itching and discharge with occasional odor. She also is complaining of persistent thrush of the tongue and scalp Monilia.  Patient reports that her hemoglobin A1c recently was 4.9.  Past medical history, past surgical history, problem list, medications, and allergies are reviewed  OBJECTIVE: BP 104/72   Pulse (!) 103   Ht 5\' 3"  (1.6 m)   Wt 204 lb 1.6 oz (92.6 kg)   BMI 36.15 kg/m  Pleasant female in no acute distress HEENT: Scaling of scalp posteriorly in the occiput region with associated mild inflammation Oropharynx: No significant tongue exudate Pelvic: External genitalia-normal BUS-normal Vagina-minimal white secretions and vaginal vault Bimanual-not performed Rectovaginal-normal external exam  PROCEDURE: Wet Prep Normal saline-no significant white blood cells; no trichomonas; no clue cells KOH-no yeast; negative whiff test  ASSESSMENT: 1. History of recurrent yeast infection 2. Status post 4 weeks of Diflucan therapy 3. Recurrent vaginal discharge 4. Wet prep negative for abnormality  PLAN: 1. Wet prep is noted 2. Nu swab with yeast sensitivity testing 3. Return as needed  A total of 15 minutes were spent face-to-face with the patient during this encounter and over half of that time dealt with counseling and coordination of care.  Brayton Mars, MD  Note: This dictation was prepared with Dragon dictation along with smaller phrase technology. Any transcriptional errors that result from this process are unintentional.

## 2017-02-10 NOTE — Patient Instructions (Signed)
1. Return as needed if recurrent vulvar itching or discharge develop 2. Microscope evaluation today revealed no evidence of yeast infection 3. Vaginal cultures are sent to rule out chronic yeast infection.

## 2017-02-14 LAB — CANDIDA 6 SPECIES PROFILE, NAA
CANDIDA ALBICANS, NAA: NEGATIVE
CANDIDA GLABRATA, NAA: NEGATIVE
CANDIDA KRUSEI, NAA: POSITIVE — AB
CANDIDA LUSITANIAE, NAA: NEGATIVE
CANDIDA TROPICALIS, NAA: NEGATIVE
Candida parapsilosis, NAA: NEGATIVE

## 2017-02-15 ENCOUNTER — Telehealth: Payer: Self-pay

## 2017-02-15 MED ORDER — TERCONAZOLE 0.4 % VA CREA: 1.0000 | TOPICAL_CREAM | Freq: Every day | VAGINAL | 0 refills | Status: DC

## 2017-02-15 MED ORDER — KETOCONAZOLE 200 MG PO TABS: 400.0000 mg | ORAL_TABLET | Freq: Every day | ORAL | 0 refills | Status: DC

## 2017-02-15 NOTE — Telephone Encounter (Signed)
-----   Message from Brayton Mars, MD sent at 02/14/2017  8:40 PM EDT ----- Please notify - Abnormal Labs Please call in ([place]) Terazol 7 intravaginal for 14 days.

## 2017-02-15 NOTE — Telephone Encounter (Signed)
Pt aware. Does not want to use terazol. Per mad ok to give ketoconazole 400 qd x 14d. Pt aware. Med erx.

## 2017-02-22 DIAGNOSIS — T782XXA Anaphylactic shock, unspecified, initial encounter: Secondary | ICD-10-CM | POA: Diagnosis not present

## 2017-02-25 IMAGING — CT CT HEAD W/O CM
4 series · 16 of 47 positions shown, 18 images · non-contrast
Comparison: November 06, 2009.

CLINICAL DATA: Dizziness with right arm drift

EXAM:
CT HEAD WITHOUT CONTRAST
TECHNIQUE: Contiguous axial images were obtained from the base of the skull
through the vertex without intravenous contrast.

[Series 2: head 5.0 st · axial · 0.45mm/px · z∈[+1254,+1354]mm · 7 of 28 slices shown, 9 images]
[im 4/28  brain]
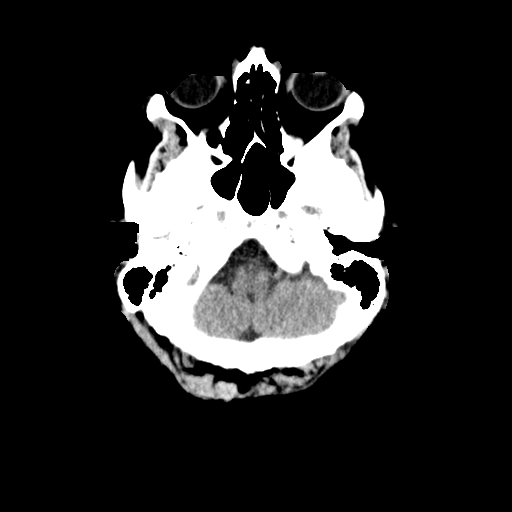
[im 4/28  bone]
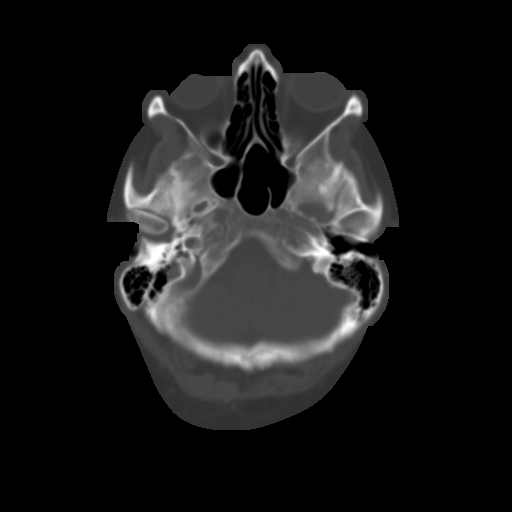
[im 7/28  brain]
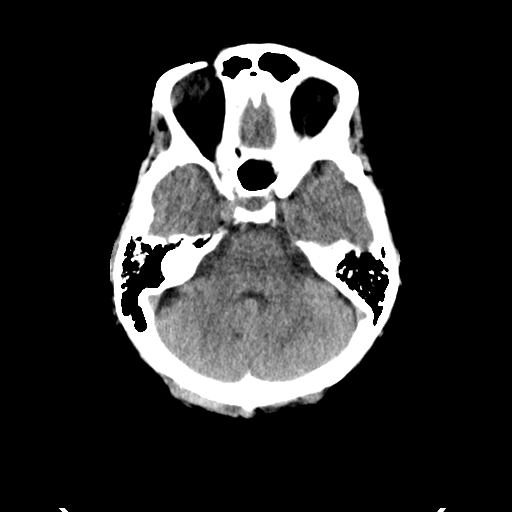
[im 11/28  brain]
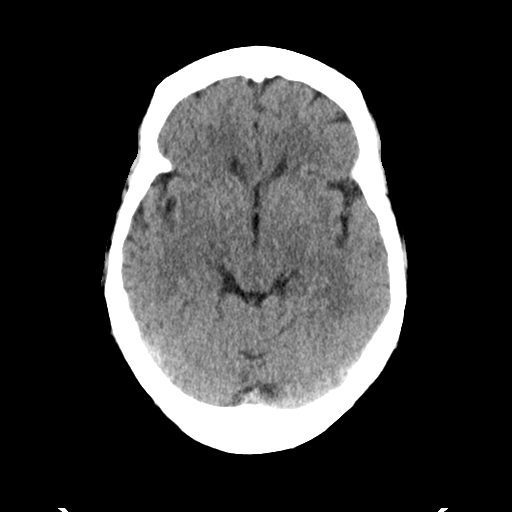
[im 14/28  brain]
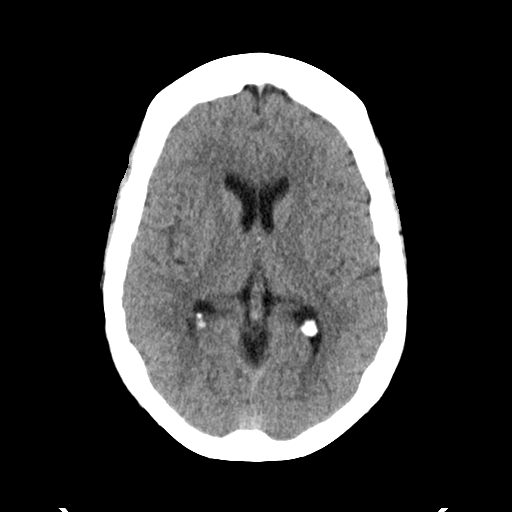
[im 17/28  brain]
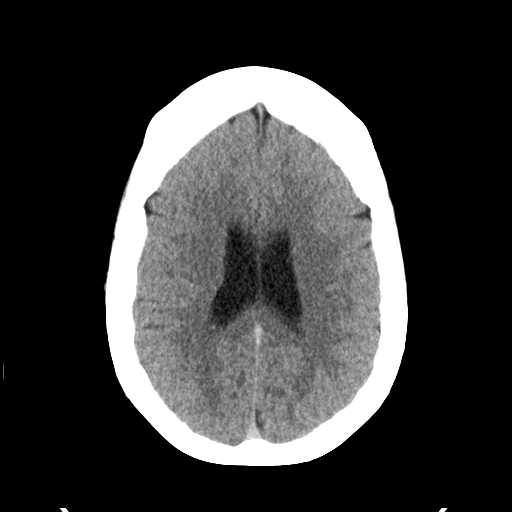
[im 17/28  bone]
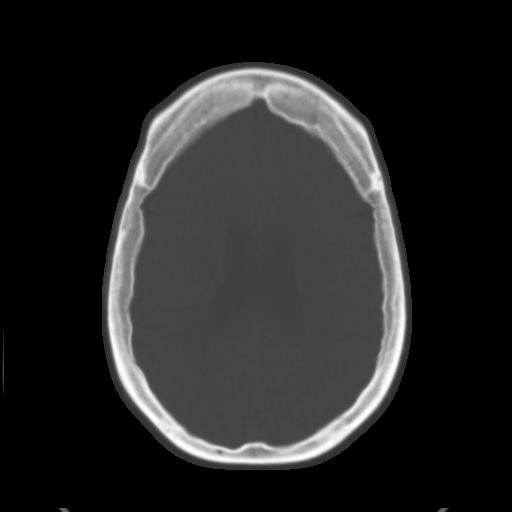
[im 21/28  brain]
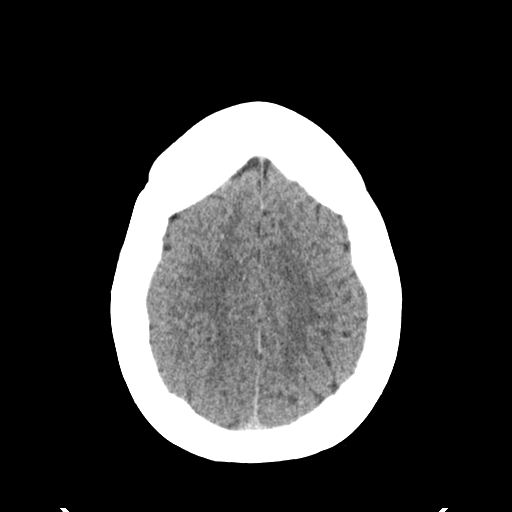
[im 24/28  brain]
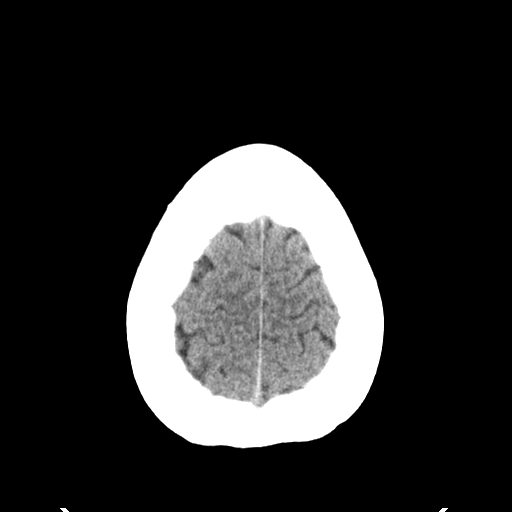

[Series 3: head 2.0 bone · axial · 0.45mm/px · z∈[+1251,+1279]mm · 3 of 70 slices shown]
[im 7/70  bone]
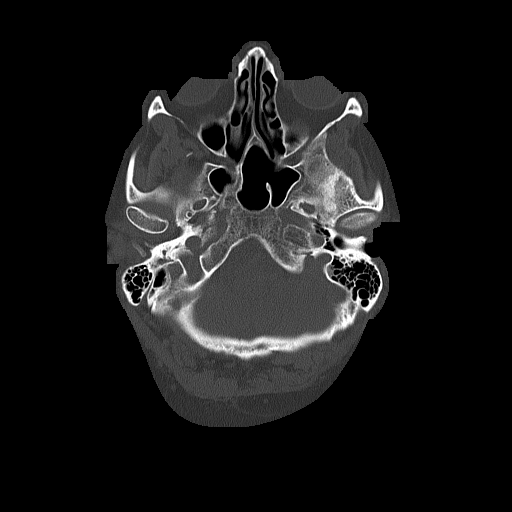
[im 14/70  bone]
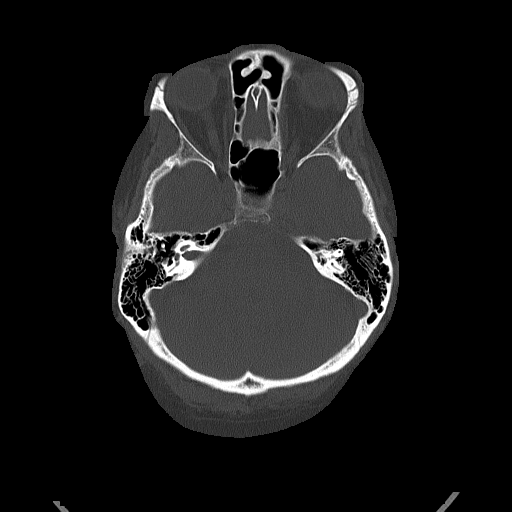
[im 21/70  bone]
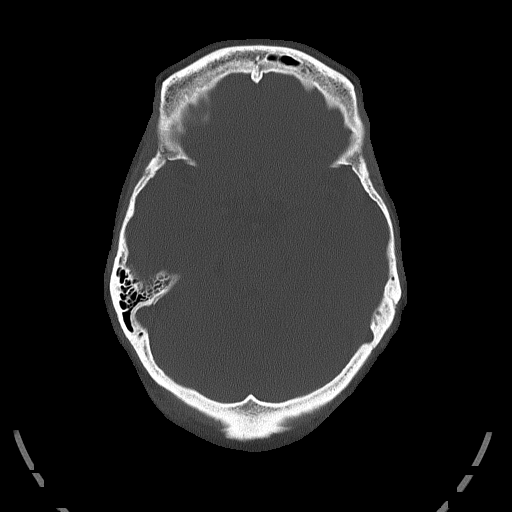

[Series 4: head 3.0 cor st · coronal · 0.30mm/px · 3 of 61 slices shown]
[im 21/61  brain]
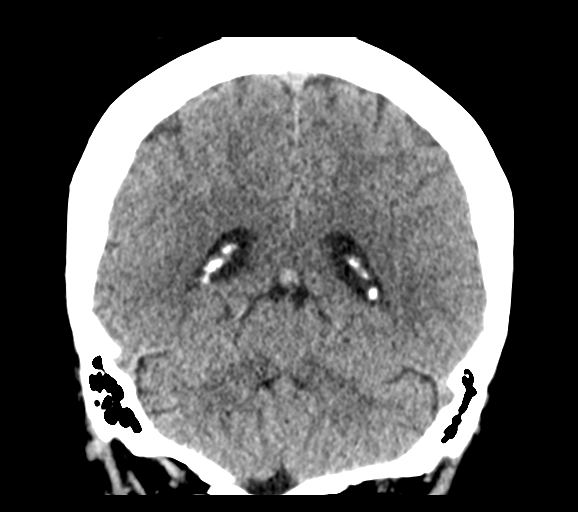
[im 27/61  brain]
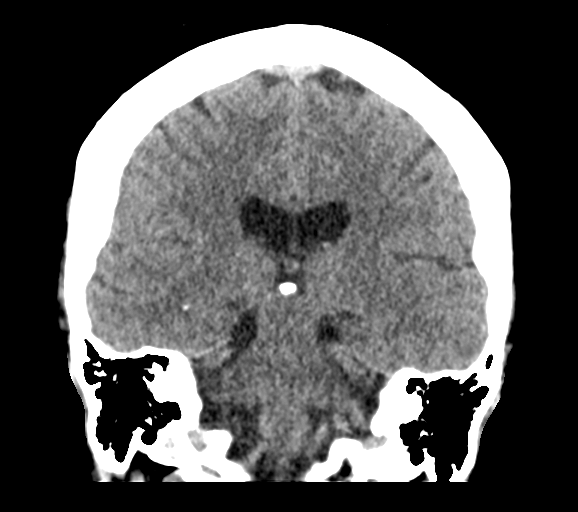
[im 34/61  brain]
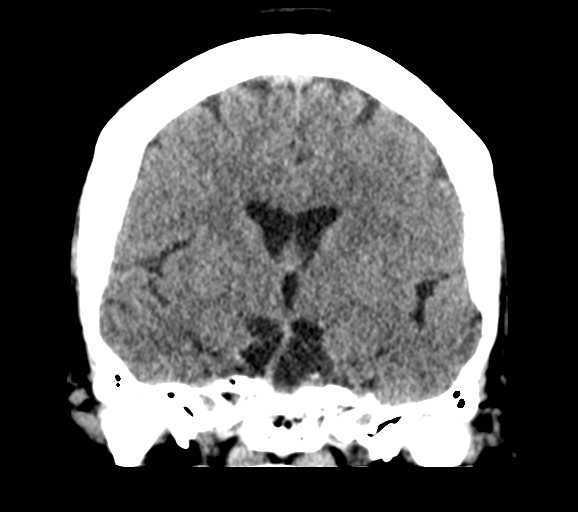

[Series 5: head 3.0 sag st · sagittal · 0.31mm/px · 3 of 46 slices shown]
[im 16/46  brain]
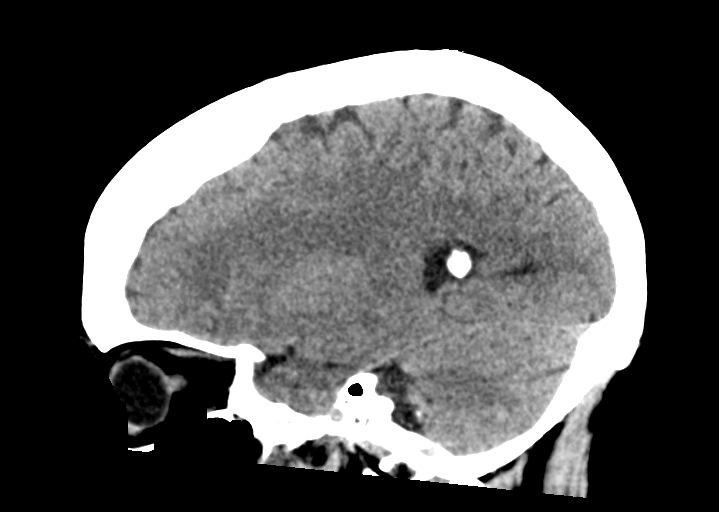
[im 23/46  brain]
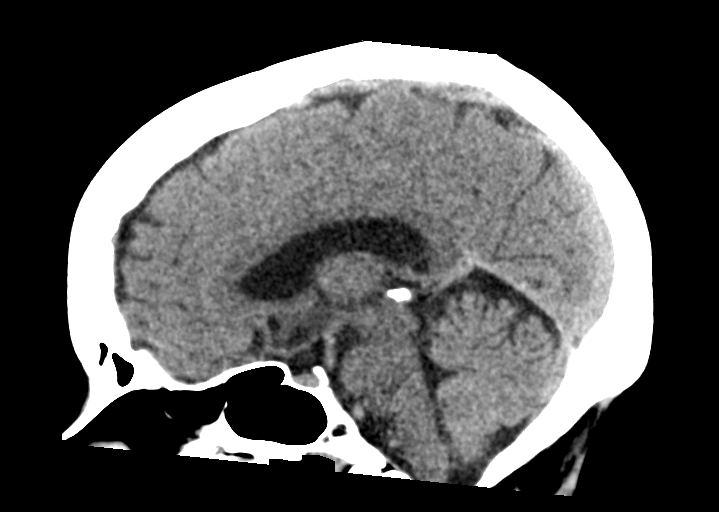
[im 31/46  brain]
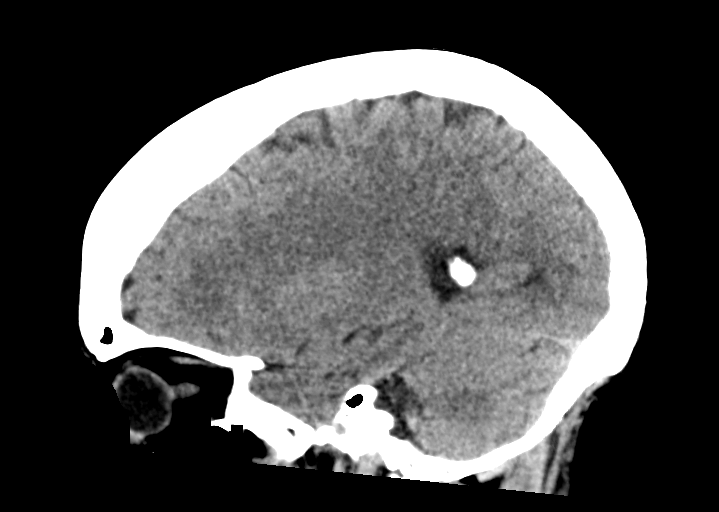

[16 of 47 positions shown; findings below may reference images not displayed]

FINDINGS: The ventricles are normal in size and configuration. There is no
intracranial mass, hemorrhage, extra-axial fluid collection, or
midline shift. Gray-white compartments appear normal. No acute
infarct evident. Middle cerebral artery attenuation is symmetric
bilaterally. The bony calvarium appears intact. The mastoid air
cells are clear. No intraorbital lesions are evident.
IMPRESSION: Study within normal limits.

Critical Value/emergent results were called by telephone at the time
of interpretation on 02/14/2016 at [DATE] to Dr. Spesyal
Abubakar, neurology, who verbally acknowledged these results.

## 2017-02-25 IMAGING — DX DG CHEST 2V
2 series · 2 of 2 positions shown · non-contrast
Comparison: December 14, 2013

CLINICAL DATA: Shortness of breath and chest pain

EXAM:
CHEST  2 VIEW

[chest pa]
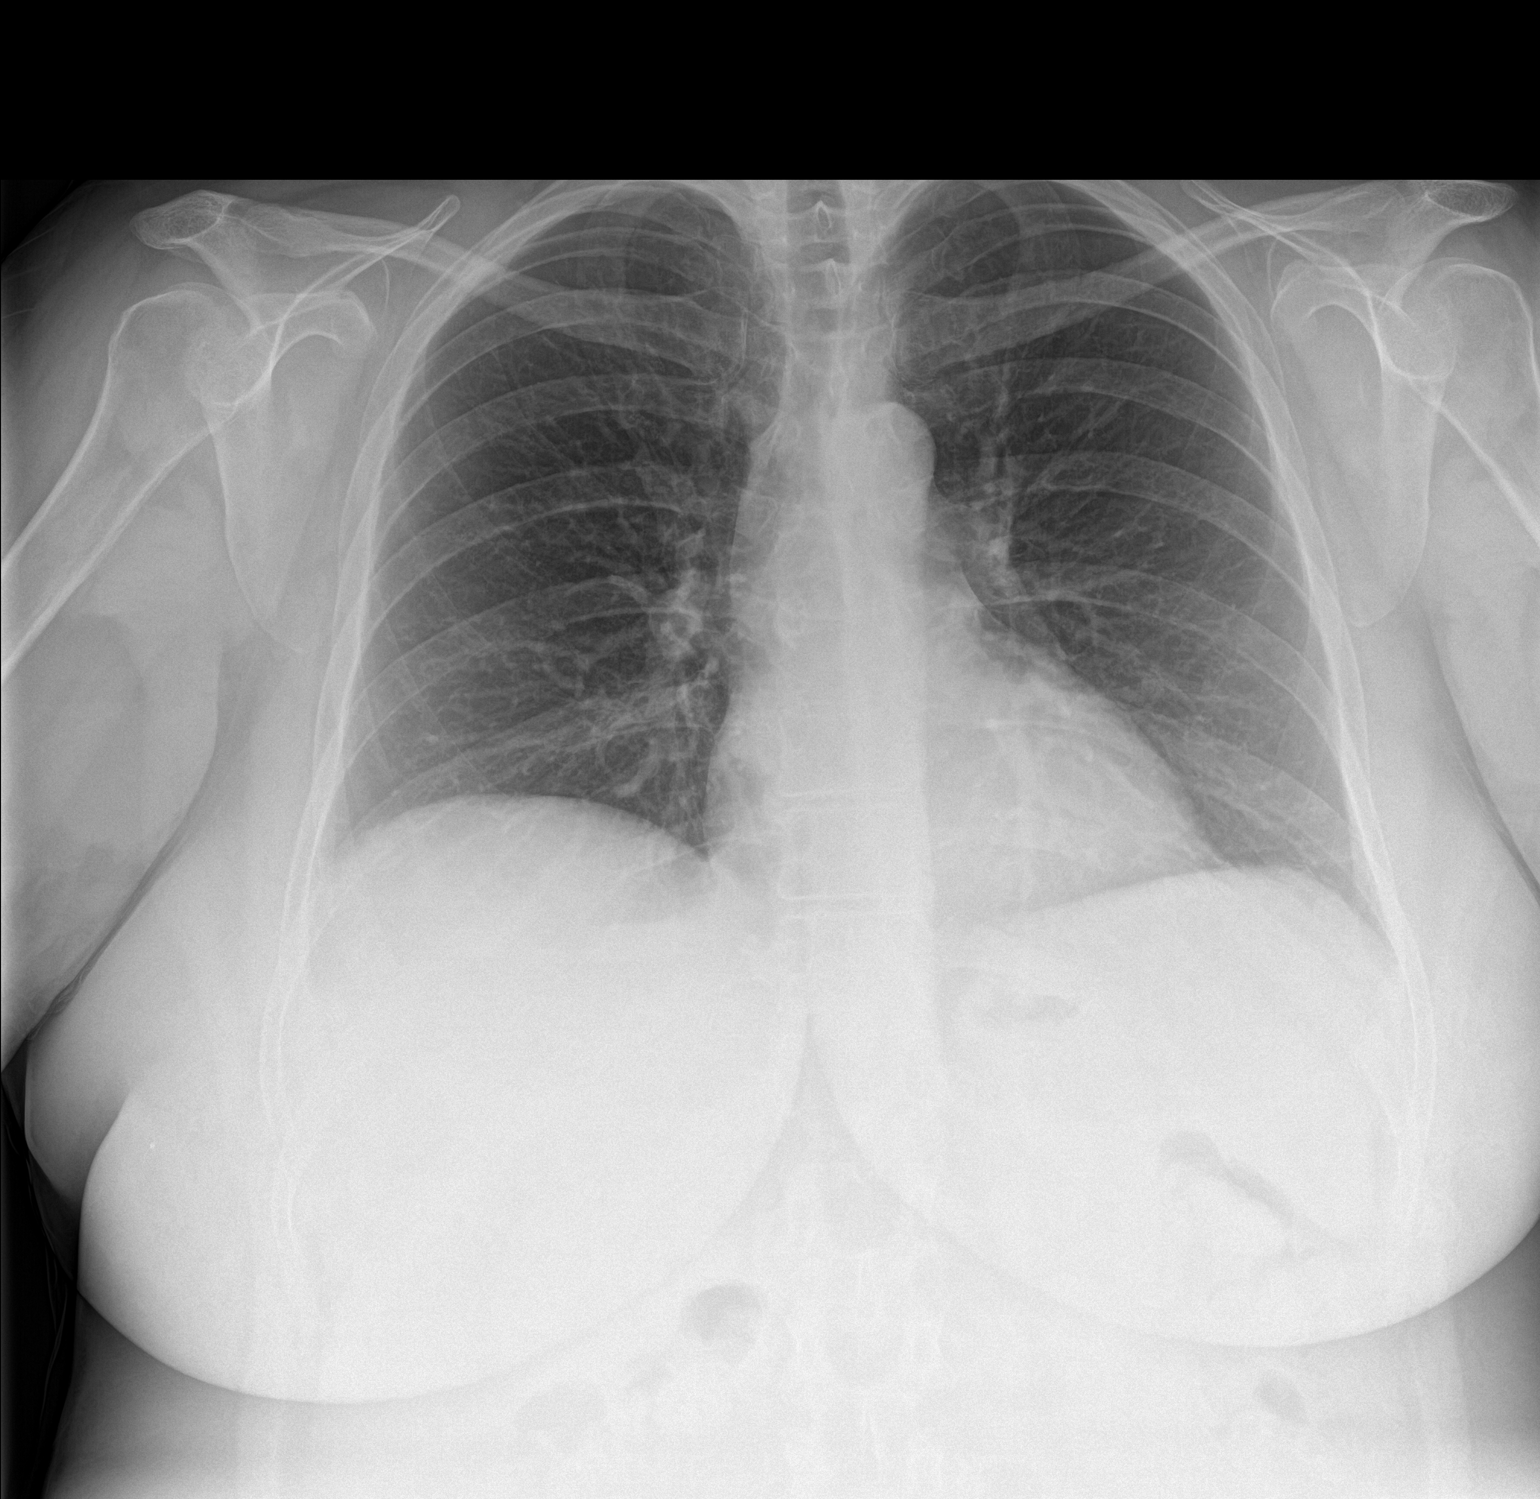

[chest lat]
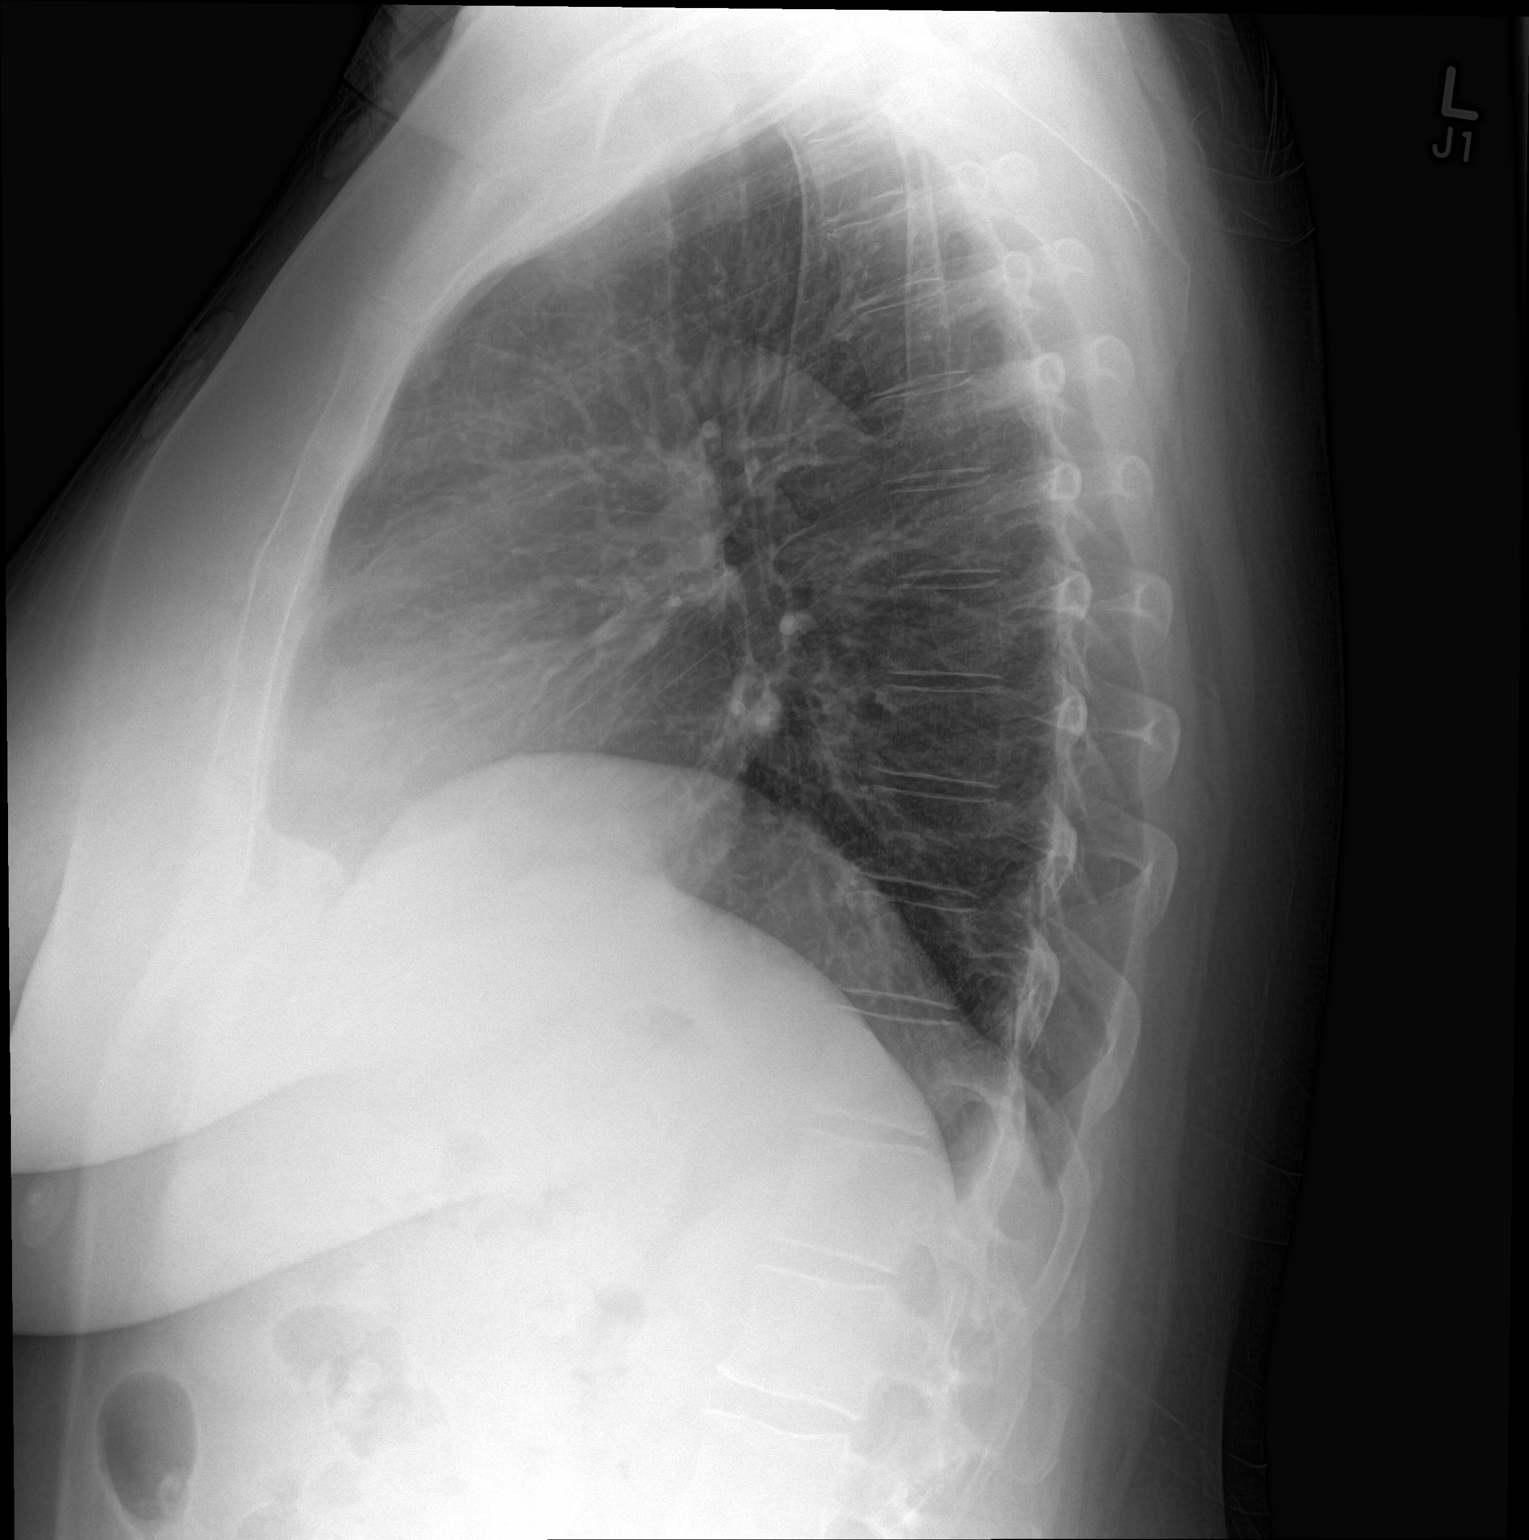

[2 of 2 positions shown; findings below may reference images not displayed]

FINDINGS: Lungs are clear. Heart size and pulmonary vascularity are normal. No
adenopathy. No pneumothorax. No bone lesions.
IMPRESSION: No edema or consolidation.

## 2017-03-01 DIAGNOSIS — R270 Ataxia, unspecified: Secondary | ICD-10-CM | POA: Diagnosis not present

## 2017-03-01 DIAGNOSIS — M6289 Other specified disorders of muscle: Secondary | ICD-10-CM | POA: Diagnosis not present

## 2017-03-01 DIAGNOSIS — R51 Headache: Secondary | ICD-10-CM | POA: Diagnosis not present

## 2017-03-01 DIAGNOSIS — G479 Sleep disorder, unspecified: Secondary | ICD-10-CM | POA: Diagnosis not present

## 2017-03-02 DIAGNOSIS — R270 Ataxia, unspecified: Secondary | ICD-10-CM | POA: Insufficient documentation

## 2017-03-02 DIAGNOSIS — M6289 Other specified disorders of muscle: Secondary | ICD-10-CM | POA: Insufficient documentation

## 2017-03-15 ENCOUNTER — Other Ambulatory Visit: Payer: Self-pay

## 2017-03-16 DIAGNOSIS — G894 Chronic pain syndrome: Secondary | ICD-10-CM | POA: Diagnosis not present

## 2017-03-16 DIAGNOSIS — M797 Fibromyalgia: Secondary | ICD-10-CM | POA: Diagnosis not present

## 2017-03-16 DIAGNOSIS — Z79891 Long term (current) use of opiate analgesic: Secondary | ICD-10-CM | POA: Diagnosis not present

## 2017-03-16 DIAGNOSIS — M545 Low back pain: Secondary | ICD-10-CM | POA: Diagnosis not present

## 2017-03-24 DIAGNOSIS — G479 Sleep disorder, unspecified: Secondary | ICD-10-CM | POA: Diagnosis not present

## 2017-03-24 DIAGNOSIS — J4520 Mild intermittent asthma, uncomplicated: Secondary | ICD-10-CM | POA: Diagnosis not present

## 2017-03-24 DIAGNOSIS — R7611 Nonspecific reaction to tuberculin skin test without active tuberculosis: Secondary | ICD-10-CM | POA: Diagnosis not present

## 2017-03-24 DIAGNOSIS — R0609 Other forms of dyspnea: Secondary | ICD-10-CM | POA: Diagnosis not present

## 2017-03-30 ENCOUNTER — Other Ambulatory Visit: Payer: Self-pay | Admitting: Physical Medicine & Rehabilitation

## 2017-03-30 DIAGNOSIS — M545 Low back pain: Secondary | ICD-10-CM

## 2017-04-11 ENCOUNTER — Other Ambulatory Visit: Payer: PPO

## 2017-04-26 DIAGNOSIS — Z79891 Long term (current) use of opiate analgesic: Secondary | ICD-10-CM | POA: Diagnosis not present

## 2017-04-26 DIAGNOSIS — G894 Chronic pain syndrome: Secondary | ICD-10-CM | POA: Diagnosis not present

## 2017-04-26 DIAGNOSIS — M064 Inflammatory polyarthropathy: Secondary | ICD-10-CM | POA: Diagnosis not present

## 2017-04-26 DIAGNOSIS — M797 Fibromyalgia: Secondary | ICD-10-CM | POA: Diagnosis not present

## 2017-04-29 DIAGNOSIS — R4189 Other symptoms and signs involving cognitive functions and awareness: Secondary | ICD-10-CM | POA: Diagnosis not present

## 2017-04-29 DIAGNOSIS — R270 Ataxia, unspecified: Secondary | ICD-10-CM | POA: Diagnosis not present

## 2017-04-29 DIAGNOSIS — R51 Headache: Secondary | ICD-10-CM | POA: Diagnosis not present

## 2017-04-29 DIAGNOSIS — G479 Sleep disorder, unspecified: Secondary | ICD-10-CM | POA: Diagnosis not present

## 2017-04-30 DIAGNOSIS — R4189 Other symptoms and signs involving cognitive functions and awareness: Secondary | ICD-10-CM | POA: Insufficient documentation

## 2017-05-07 ENCOUNTER — Other Ambulatory Visit: Payer: Self-pay | Admitting: Urology

## 2017-05-26 DIAGNOSIS — M797 Fibromyalgia: Secondary | ICD-10-CM | POA: Diagnosis not present

## 2017-05-26 DIAGNOSIS — G894 Chronic pain syndrome: Secondary | ICD-10-CM | POA: Diagnosis not present

## 2017-05-26 DIAGNOSIS — Z79891 Long term (current) use of opiate analgesic: Secondary | ICD-10-CM | POA: Diagnosis not present

## 2017-05-26 DIAGNOSIS — M064 Inflammatory polyarthropathy: Secondary | ICD-10-CM | POA: Diagnosis not present

## 2017-05-26 DIAGNOSIS — M545 Low back pain: Secondary | ICD-10-CM | POA: Diagnosis not present

## 2017-05-31 ENCOUNTER — Other Ambulatory Visit: Payer: Self-pay | Admitting: Urology

## 2017-06-02 DIAGNOSIS — M5388 Other specified dorsopathies, sacral and sacrococcygeal region: Secondary | ICD-10-CM | POA: Diagnosis not present

## 2017-06-02 DIAGNOSIS — G894 Chronic pain syndrome: Secondary | ICD-10-CM | POA: Diagnosis not present

## 2017-06-02 DIAGNOSIS — M797 Fibromyalgia: Secondary | ICD-10-CM | POA: Diagnosis not present

## 2017-06-08 DIAGNOSIS — F4481 Dissociative identity disorder: Secondary | ICD-10-CM | POA: Diagnosis not present

## 2017-06-08 DIAGNOSIS — F3181 Bipolar II disorder: Secondary | ICD-10-CM | POA: Diagnosis not present

## 2017-06-08 DIAGNOSIS — F4310 Post-traumatic stress disorder, unspecified: Secondary | ICD-10-CM | POA: Diagnosis not present

## 2017-06-08 DIAGNOSIS — F29 Unspecified psychosis not due to a substance or known physiological condition: Secondary | ICD-10-CM | POA: Diagnosis not present

## 2017-06-22 ENCOUNTER — Other Ambulatory Visit: Payer: Self-pay | Admitting: Obstetrics and Gynecology

## 2017-06-22 ENCOUNTER — Other Ambulatory Visit: Payer: Self-pay | Admitting: Urology

## 2017-06-23 DIAGNOSIS — E559 Vitamin D deficiency, unspecified: Secondary | ICD-10-CM | POA: Diagnosis not present

## 2017-06-23 DIAGNOSIS — E039 Hypothyroidism, unspecified: Secondary | ICD-10-CM | POA: Diagnosis not present

## 2017-06-23 DIAGNOSIS — J4530 Mild persistent asthma, uncomplicated: Secondary | ICD-10-CM | POA: Diagnosis not present

## 2017-06-23 DIAGNOSIS — G43109 Migraine with aura, not intractable, without status migrainosus: Secondary | ICD-10-CM | POA: Diagnosis not present

## 2017-06-23 DIAGNOSIS — J3089 Other allergic rhinitis: Secondary | ICD-10-CM | POA: Diagnosis not present

## 2017-06-23 DIAGNOSIS — E119 Type 2 diabetes mellitus without complications: Secondary | ICD-10-CM | POA: Diagnosis not present

## 2017-06-23 DIAGNOSIS — E7800 Pure hypercholesterolemia, unspecified: Secondary | ICD-10-CM | POA: Diagnosis not present

## 2017-06-23 DIAGNOSIS — K219 Gastro-esophageal reflux disease without esophagitis: Secondary | ICD-10-CM | POA: Diagnosis not present

## 2017-06-23 DIAGNOSIS — K760 Fatty (change of) liver, not elsewhere classified: Secondary | ICD-10-CM | POA: Diagnosis not present

## 2017-07-05 DIAGNOSIS — Z6835 Body mass index (BMI) 35.0-35.9, adult: Secondary | ICD-10-CM | POA: Diagnosis not present

## 2017-07-05 DIAGNOSIS — M797 Fibromyalgia: Secondary | ICD-10-CM | POA: Diagnosis not present

## 2017-07-05 DIAGNOSIS — M545 Low back pain: Secondary | ICD-10-CM | POA: Diagnosis not present

## 2017-07-05 DIAGNOSIS — G894 Chronic pain syndrome: Secondary | ICD-10-CM | POA: Diagnosis not present

## 2017-07-28 ENCOUNTER — Other Ambulatory Visit: Payer: Self-pay | Admitting: Urology

## 2017-08-05 DIAGNOSIS — G894 Chronic pain syndrome: Secondary | ICD-10-CM | POA: Diagnosis not present

## 2017-08-05 DIAGNOSIS — M545 Low back pain: Secondary | ICD-10-CM | POA: Diagnosis not present

## 2017-08-10 DIAGNOSIS — E11649 Type 2 diabetes mellitus with hypoglycemia without coma: Secondary | ICD-10-CM | POA: Diagnosis not present

## 2017-08-10 DIAGNOSIS — E282 Polycystic ovarian syndrome: Secondary | ICD-10-CM | POA: Diagnosis not present

## 2017-08-10 DIAGNOSIS — E039 Hypothyroidism, unspecified: Secondary | ICD-10-CM | POA: Diagnosis not present

## 2017-08-10 DIAGNOSIS — E669 Obesity, unspecified: Secondary | ICD-10-CM | POA: Diagnosis not present

## 2017-08-10 DIAGNOSIS — Z6837 Body mass index (BMI) 37.0-37.9, adult: Secondary | ICD-10-CM | POA: Diagnosis not present

## 2017-08-24 ENCOUNTER — Other Ambulatory Visit: Payer: Self-pay | Admitting: Urology

## 2017-09-02 DIAGNOSIS — Z79891 Long term (current) use of opiate analgesic: Secondary | ICD-10-CM | POA: Diagnosis not present

## 2017-09-02 DIAGNOSIS — G894 Chronic pain syndrome: Secondary | ICD-10-CM | POA: Diagnosis not present

## 2017-09-02 DIAGNOSIS — M545 Low back pain: Secondary | ICD-10-CM | POA: Diagnosis not present

## 2017-09-02 DIAGNOSIS — M62830 Muscle spasm of back: Secondary | ICD-10-CM | POA: Diagnosis not present

## 2017-09-19 ENCOUNTER — Other Ambulatory Visit: Payer: Self-pay | Admitting: Urology

## 2017-09-28 DIAGNOSIS — M62830 Muscle spasm of back: Secondary | ICD-10-CM | POA: Diagnosis not present

## 2017-09-28 DIAGNOSIS — Z6835 Body mass index (BMI) 35.0-35.9, adult: Secondary | ICD-10-CM | POA: Diagnosis not present

## 2017-09-28 DIAGNOSIS — M545 Low back pain: Secondary | ICD-10-CM | POA: Diagnosis not present

## 2017-09-28 DIAGNOSIS — G894 Chronic pain syndrome: Secondary | ICD-10-CM | POA: Diagnosis not present

## 2017-10-01 DIAGNOSIS — K219 Gastro-esophageal reflux disease without esophagitis: Secondary | ICD-10-CM | POA: Diagnosis not present

## 2017-10-01 DIAGNOSIS — J4520 Mild intermittent asthma, uncomplicated: Secondary | ICD-10-CM | POA: Diagnosis not present

## 2017-10-11 DIAGNOSIS — F29 Unspecified psychosis not due to a substance or known physiological condition: Secondary | ICD-10-CM | POA: Diagnosis not present

## 2017-10-11 DIAGNOSIS — F3181 Bipolar II disorder: Secondary | ICD-10-CM | POA: Diagnosis not present

## 2017-10-11 DIAGNOSIS — F4481 Dissociative identity disorder: Secondary | ICD-10-CM | POA: Diagnosis not present

## 2017-10-11 DIAGNOSIS — F4310 Post-traumatic stress disorder, unspecified: Secondary | ICD-10-CM | POA: Diagnosis not present

## 2017-11-01 ENCOUNTER — Other Ambulatory Visit: Payer: Self-pay | Admitting: Urology

## 2017-11-03 ENCOUNTER — Ambulatory Visit (INDEPENDENT_AMBULATORY_CARE_PROVIDER_SITE_OTHER): Payer: PPO

## 2017-11-03 ENCOUNTER — Telehealth: Payer: Self-pay | Admitting: Obstetrics and Gynecology

## 2017-11-03 VITALS — BP 138/81 | HR 57 | Ht 63.0 in | Wt 196.0 lb

## 2017-11-03 DIAGNOSIS — N3010 Interstitial cystitis (chronic) without hematuria: Secondary | ICD-10-CM

## 2017-11-03 LAB — URINALYSIS, COMPLETE
BILIRUBIN UA: NEGATIVE
Glucose, UA: NEGATIVE
Ketones, UA: NEGATIVE
LEUKOCYTES UA: NEGATIVE
Nitrite, UA: NEGATIVE
PH UA: 6.5 (ref 5.0–7.5)
PROTEIN UA: NEGATIVE
Specific Gravity, UA: <1.005 — ABNORMAL LOW (ref 1.005–1.030)
Urobilinogen, Ur: 0.2 mg/dL (ref 0.2–1.0)

## 2017-11-03 LAB — MICROSCOPIC EXAMINATION: RBC, UA: NONE SEEN /hpf (ref 0–?)

## 2017-11-03 NOTE — Progress Notes (Signed)
Pt presents today with c/o urinary frequency, urgency, hard to postpone urination, dysuria, leakage of urine, back pain, nausea, vomiting, night sweats, and chills.  A clean catch specimen was obtained for u/a and cx.   Blood pressure 138/81, pulse (!) 57, height 5\' 3"  (1.6 m), weight 196 lb (88.9 kg).

## 2017-11-03 NOTE — Telephone Encounter (Signed)
The patient called and stated that she has a bladder infection, The patient would like to speak with a nurse to determine if she needs an appointment of prescription. Please advise.

## 2017-11-03 NOTE — Telephone Encounter (Signed)
Pt thinks she has a kidney infection. She is having back pain, malasie, frequency, and fever x 1 day. She recently dropped a urine specimen off at [facility].

## 2017-11-05 LAB — CULTURE, URINE COMPREHENSIVE

## 2017-11-09 ENCOUNTER — Telehealth: Payer: Self-pay

## 2017-11-09 NOTE — Telephone Encounter (Signed)
Letter sent.

## 2017-11-09 NOTE — Telephone Encounter (Signed)
-----   Message from Hollice Espy, MD sent at 11/07/2017 10:49 AM EST ----- No evidence of infection.  If your symptoms persist, please make a f/u.  There may be something else going on such OAB.  Hollice Espy, MD

## 2017-11-29 DIAGNOSIS — M62830 Muscle spasm of back: Secondary | ICD-10-CM | POA: Diagnosis not present

## 2017-11-29 DIAGNOSIS — M545 Low back pain: Secondary | ICD-10-CM | POA: Diagnosis not present

## 2017-11-29 DIAGNOSIS — G894 Chronic pain syndrome: Secondary | ICD-10-CM | POA: Diagnosis not present

## 2017-12-01 DIAGNOSIS — G894 Chronic pain syndrome: Secondary | ICD-10-CM | POA: Diagnosis not present

## 2017-12-01 DIAGNOSIS — M5388 Other specified dorsopathies, sacral and sacrococcygeal region: Secondary | ICD-10-CM | POA: Diagnosis not present

## 2017-12-01 DIAGNOSIS — M7912 Myalgia of auxiliary muscles, head and neck: Secondary | ICD-10-CM | POA: Diagnosis not present

## 2017-12-01 DIAGNOSIS — M797 Fibromyalgia: Secondary | ICD-10-CM | POA: Diagnosis not present

## 2017-12-05 ENCOUNTER — Other Ambulatory Visit: Payer: Self-pay | Admitting: Urology

## 2017-12-07 NOTE — Telephone Encounter (Signed)
Patient notified

## 2017-12-08 DIAGNOSIS — F3181 Bipolar II disorder: Secondary | ICD-10-CM | POA: Diagnosis not present

## 2017-12-08 DIAGNOSIS — F4310 Post-traumatic stress disorder, unspecified: Secondary | ICD-10-CM | POA: Diagnosis not present

## 2017-12-08 DIAGNOSIS — F29 Unspecified psychosis not due to a substance or known physiological condition: Secondary | ICD-10-CM | POA: Diagnosis not present

## 2017-12-08 DIAGNOSIS — F4481 Dissociative identity disorder: Secondary | ICD-10-CM | POA: Diagnosis not present

## 2017-12-27 NOTE — Progress Notes (Signed)
Patient ID: Stacy Daniel, female   DOB: 05/25/1971, 47 y.o.   MRN: [id number] ANNUAL PREVENTATIVE CARE GYN  ENCOUNTER NOTE  Subjective:       Stacy Daniel is a 47 y.o. G0P0000 female here for a routine annual gynecologic exam.  Current complaints: 1. Current yeast infection; patient does report increased vulvar itching with minimal discharge.  She states that her blood sugars are reasonably controlled.   Patient is on estradiol 1 mg a day and dose have vasomotor symptoms. She does experience vaginal dryness. She is not currently sexually active. Patient is on calcium with vitamin D supplementation due to history of osteopenia and surgical menopause.  Bowel function is normal. Interstitial cystitis is reasonably controlled now on Elmeron; Dr. Hollice Espy is managing this condition.  Gynecologic History No LMP recorded. Patient has had a hysterectomy. Contraception: status post hysterectomy TAH/BSO Last Pap: 12/2015 neg. Results were: normal Last mammogram: 01/11/2017 birad 3. Results were: normal  Obstetric History OB History  Gravida Para Term Preterm AB Living  0 0 0 0 0 0  SAB TAB Ectopic Multiple Live Births  0 0 0 0      Past Medical History:  Diagnosis Date  . Angioedema   . Anxiety   . Bipolar 1 disorder ([place])   . Borderline personality disorder ([place])   . Breast pain in female    chronic, right  . Chronic bronchitis ([place])   . COPD (chronic obstructive pulmonary disease) ([place])   . Diabetes mellitus without complication ([place])   . Fibromyalgia   . Hypoglycemia   . IC (interstitial cystitis)   . OCD (obsessive compulsive disorder)   . Osteoarthritis   . Osteoarthritis   . PCOS (polycystic ovarian syndrome)   . Post traumatic stress disorder   . Rheumatoid arthritis ([place])   . Rheumatoid arthritis ([place])   . Schizoaffective disorder, bipolar type ([place])   . Shingles (herpes zoster) polyneuropathy   . Stroke [facility]) 02/14/16   TIA    Past Surgical History:   Procedure Laterality Date  . ABDOMINAL HYSTERECTOMY  06/1997   with left ovarectomy  . GANGLION CYST EXCISION  11/2010   right wrist  . OOPHORECTOMY Right 1995  . WISDOM TOOTH EXTRACTION  1988    Current Outpatient Medications on File Prior to Visit  Medication Sig Dispense Refill  . albuterol (PROVENTIL HFA;VENTOLIN HFA) 108 (90 BASE) MCG/ACT inhaler Inhale 2 puffs into the lungs every 6 (six) hours as needed for wheezing or shortness of breath.    . Ascorbic Acid (VITAMIN C ER PO) Take 120 mg by mouth.    Marland Kitchen b complex vitamins tablet Take 1 tablet by mouth daily.    . BD PEN NEEDLE NANO U/F 32G X 4 MM MISC     . cholecalciferol (VITAMIN D) 1000 units tablet Take 1,000 Units by mouth 2 (two) times daily.    . diphenhydrAMINE (BENADRYL) 50 MG capsule Take by mouth. Takes 25 mg/4 for angioedema    . docusate sodium (COLACE) 100 MG capsule Take 1 capsule (100 mg total) by mouth 2 (two) times daily. 60 capsule 12  . ELMIRON 100 MG capsule TAKE ONE CAPSULE BY MOUTH THREE TIMES DAILY 90 capsule 0  . ELMIRON 100 MG capsule TAKE 1 CAPSULE BY MOUTH THREE TIMES DAILY 90 capsule 0  . EPINEPHrine 0.3 mg/0.3 mL IJ SOAJ injection Inject 0.3 mLs (0.3 mg total) into the muscle once. 1 Device 0  . estradiol (ESTRACE VAGINAL) 0.1 MG/GM vaginal cream  Place 0.96 Applicatorfuls vaginally 2 (two) times a week. 42.5 g 12  . estradiol (ESTRACE) 1 MG tablet Take 1 tablet (1 mg total) by mouth daily. 90 tablet 3  . Ferrous Sulfate (IRON) 142 (45 Fe) MG TBCR Take by mouth.    . Flaxseed, Linseed, (FLAX SEED OIL PO) Take by mouth.    . fluconazole (DIFLUCAN) 150 MG tablet TAKE 1 TABLET(150 MG) BY MOUTH 1 TIME A WEEK FOR 4 WEEKS 4 tablet 0  . Flunisolide HFA 80 MCG/ACT AERS Inhale 1 puff into the lungs daily.    . fluticasone (FLONASE) 50 MCG/ACT nasal spray Place into the nose.    . folic acid (FOLVITE) 045 MCG tablet Take 400 mcg by mouth daily. Reported on 02/18/2016    . gabapentin (NEURONTIN) 300 MG capsule  Take 300 mg by mouth 4 (four) times daily.    Marland Kitchen ketoconazole (NIZORAL) 2 % shampoo     . ketoconazole (NIZORAL) 200 MG tablet Take 2 tablets (400 mg total) by mouth daily. 28 tablet 0  . levocetirizine (XYZAL) 5 MG tablet Take 5 mg by mouth every evening.    Marland Kitchen levothyroxine (SYNTHROID, LEVOTHROID) 75 MCG tablet Take 75 mcg by mouth at bedtime.    Marland Kitchen LORazepam (ATIVAN) 1 MG tablet Take 1 mg by mouth 2 (two) times daily. Take morning and night.    . Magnesium 250 MG TABS Take by mouth.    . metFORMIN (GLUCOPHAGE-XR) 500 MG 24 hr tablet Take 500 mg by mouth daily with breakfast. Take 2 tablets in the morning and 2 tablets at night.    . methylPREDNISolone (MEDROL) 4 MG tablet Day one - 6 tab Day two - 5 tab Day three - 4 tabs Day four - 3 tabs Day five - 2 tabs Day six - 1 tab    . montelukast (SINGULAIR) 10 MG tablet Take 10 mg by mouth at bedtime.    . Nebulizers (VIOS AEROSOL DELIVERY SYSTEM) MISC U UTD  1  . niacin 500 MG tablet Take 500 mg by mouth at bedtime.    Marland Kitchen omeprazole (PRILOSEC) 20 MG capsule Take 20 mg by mouth daily. At night.    . ONE TOUCH ULTRA TEST test strip USE TO CHECK BLOOD SUGARS 2 TO 3 TIMES A DAY UTD FOR 90 DAYS  3  . Oxycodone HCl 10 MG TABS     . predniSONE (DELTASONE) 20 MG tablet Take 2 tablets (40 mg total) by mouth daily. 8 tablet 0  . PULMICORT FLEXHALER 180 MCG/ACT inhaler     . ranitidine (ZANTAC) 75 MG tablet Take 75 mg by mouth every morning.     Marland Kitchen RELPAX 40 MG tablet TK 1 T PO AT ONSET MAY REPEAT Q 2 H  NOT TO EXCEED 5 TS  D  4  . rizatriptan (MAXALT) 10 MG tablet TK 1 T PO AOS OF HEADACHE. MAY REPEAT IN 2 H IF NO IMPROVEMENT. MAXIMUM OF 30 MG PER DAY    . Saccharomyces boulardii (PROBIOTIC) 250 MG CAPS Take by mouth.    . terconazole (TERAZOL 7) 0.4 % vaginal cream Place 1 applicator vaginally at bedtime. For 14 nights 45 g 0  . theophylline (THEO-24) 300 MG 24 hr capsule Take 300 mg by mouth daily.    Marland Kitchen thiothixene (NAVANE) 2 MG capsule Take 2 mg by mouth at  bedtime.    Marland Kitchen tiZANidine (ZANAFLEX) 2 MG tablet Take 2 mg by mouth 3 (three) times daily. Take half tablet every  4 hours.    . topiramate (TOPAMAX) 100 MG tablet Take 100 mg by mouth daily. Take 4 tablets at night.    . valACYclovir (VALTREX) 1000 MG tablet Take 1,000 mg by mouth 3 (three) times daily.    Marland Kitchen venlafaxine XR (EFFEXOR-XR) 150 MG 24 hr capsule Take 150 mg by mouth at bedtime.    Marland Kitchen VICTOZA 18 MG/3ML SOPN     . vitamin B-12 (CYANOCOBALAMIN) 1000 MCG tablet Take by mouth.    Penne Lash HFA 45 MCG/ACT inhaler INL 1 TO 2 PFS PO Q 4 H PRN  0   No current facility-administered medications on file prior to visit.     Allergies  Allergen Reactions  . Albuterol Anaphylaxis  . Aripiprazole Anaphylaxis  . Azelastine Anaphylaxis  . Budesonide Anaphylaxis  . Budesonide-Formoterol Fumarate Anaphylaxis  . Ciclesonide Anaphylaxis  . Fluticasone Anaphylaxis  . Fluticasone Furoate Anaphylaxis  . Fluticasone-Salmeterol Anaphylaxis  . Gadolinium Derivatives Anaphylaxis  . Hydrocodone-Acetaminophen Anaphylaxis  . Iodine Anaphylaxis    And iodide containing products   . Ivp Dye [Iodinated Diagnostic Agents] Anaphylaxis  . Latex Anaphylaxis  . Mometasone Anaphylaxis  . Other Anaphylaxis    Petrochemical products  . Peanut-Containing Drug Products Anaphylaxis  . Penicillins Anaphylaxis  . Petrolatum Anaphylaxis    Yellow   . Tamsulosin Anaphylaxis  . Tdap [Tetanus-Diphth-Acell Pertussis] Anaphylaxis  . Venlafaxine Other (See Comments)    Migraine, causes eye to close  . Zinc Oxide Anaphylaxis  . Albuterol Sulfate Other (See Comments)    Gi distress   . Antihistamines, Chlorpheniramine-Type Other (See Comments)    Gi distress   . Cetirizine & Related Other (See Comments)    Gi distress   . Duloxetine Other (See Comments)    Gi distress   . Fexofenadine Other (See Comments)    Gi distress   . Haloperidol And Related Other (See Comments)    Gi distress   .  Ipratropium-Albuterol Other (See Comments)    Gi distress   . Ipratropium-Albuterol Other (See Comments)  . Levothyroxine Other (See Comments)    Gi distress   . Levothyroxine Sodium Nausea And Vomiting  . Metformin And Related Nausea And Vomiting  . Montelukast Sodium Other (See Comments)    Gi distress   . Omeprazole Nausea And Vomiting  . Sitagliptin Nausea And Vomiting  . Ziprasidone Hcl Other (See Comments)    Gi distress   . Adhesive [Tape] Hives and Rash  . Bupropion Anxiety    Social History   Socioeconomic History  . Marital status: Single    Spouse name: Not on file  . Number of children: 0  . Years of education: 76  . Highest education level: Not on file  Occupational History    Comment: volunteers  Social Needs  . Financial resource strain: Not on file  . Food insecurity:    Worry: Not on file    Inability: Not on file  . Transportation needs:    Medical: Not on file    Non-medical: Not on file  Tobacco Use  . Smoking status: Never Smoker  . Smokeless tobacco: Never Used  Substance and Sexual Activity  . Alcohol use: No    Comment: quit 2011  . Drug use: No  . Sexual activity: Not Currently    Birth control/protection: Surgical  Lifestyle  . Physical activity:    Days per week: Not on file    Minutes per session: Not on file  . Stress: Not on  file  Relationships  . Social connections:    Talks on phone: Not on file    Gets together: Not on file    Attends religious service: Not on file    Active member of club or organization: Not on file    Attends meetings of clubs or organizations: Not on file    Relationship status: Not on file  . Intimate partner violence:    Fear of current or ex partner: Not on file    Emotionally abused: Not on file    Physically abused: Not on file    Forced sexual activity: Not on file  Other Topics Concern  . Not on file  Social History Narrative   Lives with parents   Caffeine use- coffee 2 daily, soda 2  daily    Family History  Problem Relation Age of Onset  . Stroke Paternal Aunt   . Diabetes Father   . Obesity Father   . Hyperthyroidism Mother   . Cancer Sister        skin  . Osteoporosis Maternal Grandmother   . Hyperthyroidism Maternal Grandmother   . Bipolar disorder Maternal Grandmother   . Heart attack Maternal Grandfather   . Diabetes Paternal Grandmother   . Heart attack Paternal Grandfather   . Breast cancer Paternal Aunt   . Diabetes Paternal Aunt   . Breast cancer Cousin   . Diabetes Cousin   . Bipolar disorder Cousin   . Bladder Cancer Neg Hx   . Prostate cancer Neg Hx   . Kidney cancer Neg Hx   . Ovarian cancer Neg Hx   . Colon cancer Neg Hx   . Heart disease Neg Hx     The following portions of the patient's history were reviewed and updated as appropriate: allergies, current medications, past family history, past medical history, past social history, past surgical history and problem list.  Review of Systems  Review of Systems  Constitutional: Negative.   HENT: Negative.   Eyes: Negative.   Respiratory: Negative.   Cardiovascular: Negative.   Gastrointestinal: Negative.   Genitourinary: Positive for frequency and urgency.       History of interstitial cystitis  Musculoskeletal: Negative.   Skin: Positive for itching.       Vulvar itching  Neurological: Negative.   Psychiatric/Behavioral: Negative.     Objective:   BP 125/79   Pulse (!) 103   Ht 5\' 3"  (1.6 m)   Wt 205 lb 3.2 oz (93.1 kg)   BMI 36.35 kg/m  CONSTITUTIONAL: Well-developed, well-nourished female in no acute distress.  PSYCHIATRIC: Normal mood and affect. Normal behavior. Normal judgment and thought content. [place]: Alert and oriented to person, place, and time. Normal muscle tone coordination. No cranial nerve deficit noted. HENT:  Normocephalic, atraumatic, External right and left ear normal.t EYES: Conjunctivae and EOM are normal.No scleral icterus.  NECK: Normal range of  motion, supple, no masses.  Normal thyroid.  SKIN: Skin is warm and dry. No rash noted. Not diaphoretic. No erythema. No pallor. CARDIOVASCULAR: Normal heart rate noted, regular rhythm, no murmur. RESPIRATORY: Clear to auscultation bilaterally. Effort and breath sounds normal, no problems with respiration noted. BREASTS: Symmetric in size. No masses, skin changes, nipple drainage, or lymphadenopathy. ABDOMEN: Soft, normal bowel sounds, no distention noted.  No tenderness, rebound or guarding.  BLADDER: Normal PELVIC:  External Genitalia: Minimal vulvar hyperemia and patchy white discharge at introitus  BUS: Normal  Vagina: Moderate atrophy; good vault support; scant patches of white  discharge in vault  Cervix: Surgically absent  Uterus: Surgically absent  Adnexa: Normal; nonpalpable nontender  RV: External Exam NormaI, No Rectal Masses and Normal Sphincter tone  MUSCULOSKELETAL: Normal range of motion. No tenderness.  No cyanosis, clubbing, or edema. LYMPHATIC: No Axillary, Supraclavicular, or Inguinal Adenopathy.  Assessment:   Annual gynecologic examination 47 y.o. Contraception: status post hysterectomy TAH/BSO bmi- 36 Surgical menopause,  Vaginal atrophy, symptomatic Osteopenia History of recurrent yeast infections; symptomatic at this time Interstitial cystitis, controlled on Elmeron  Plan:  Pap: not needed Mammogram: Ordered  Stool Guaiac Testing:  Not Indicated Labs: thru pcp Routine preventative health maintenance measures emphasized: Exercise/Diet/Weight control, Tobacco Warnings, Alcohol/Substance use risks and Safe Sex Estradiol 1 mg daily refills Diflucan 150 mg orally prescribed for yeast infection with refills Return to [place], CMA  Brayton Mars, MD  Note: This dictation was prepared with Dragon dictation along with smaller phrase technology. Any transcriptional errors that result from this process are unintentional.

## 2017-12-30 ENCOUNTER — Ambulatory Visit (INDEPENDENT_AMBULATORY_CARE_PROVIDER_SITE_OTHER): Payer: PPO | Admitting: Obstetrics and Gynecology

## 2017-12-30 ENCOUNTER — Encounter: Payer: Self-pay | Admitting: Obstetrics and Gynecology

## 2017-12-30 VITALS — BP 125/79 | HR 103 | Ht 63.0 in | Wt 205.2 lb

## 2017-12-30 DIAGNOSIS — Z01419 Encounter for gynecological examination (general) (routine) without abnormal findings: Secondary | ICD-10-CM | POA: Diagnosis not present

## 2017-12-30 DIAGNOSIS — N952 Postmenopausal atrophic vaginitis: Secondary | ICD-10-CM | POA: Diagnosis not present

## 2017-12-30 DIAGNOSIS — R638 Other symptoms and signs concerning food and fluid intake: Secondary | ICD-10-CM

## 2017-12-30 DIAGNOSIS — M8580 Other specified disorders of bone density and structure, unspecified site: Secondary | ICD-10-CM

## 2017-12-30 DIAGNOSIS — Z1231 Encounter for screening mammogram for malignant neoplasm of breast: Secondary | ICD-10-CM | POA: Diagnosis not present

## 2017-12-30 DIAGNOSIS — B373 Candidiasis of vulva and vagina: Secondary | ICD-10-CM

## 2017-12-30 DIAGNOSIS — B3731 Acute candidiasis of vulva and vagina: Secondary | ICD-10-CM

## 2017-12-30 DIAGNOSIS — E1169 Type 2 diabetes mellitus with other specified complication: Secondary | ICD-10-CM | POA: Diagnosis not present

## 2017-12-30 DIAGNOSIS — E8940 Asymptomatic postprocedural ovarian failure: Secondary | ICD-10-CM

## 2017-12-30 DIAGNOSIS — Z1239 Encounter for other screening for malignant neoplasm of breast: Secondary | ICD-10-CM

## 2017-12-30 DIAGNOSIS — N3010 Interstitial cystitis (chronic) without hematuria: Secondary | ICD-10-CM | POA: Diagnosis not present

## 2017-12-30 DIAGNOSIS — E669 Obesity, unspecified: Secondary | ICD-10-CM | POA: Diagnosis not present

## 2017-12-30 MED ORDER — ESTRADIOL 1 MG PO TABS: 1.0000 mg | ORAL_TABLET | Freq: Every day | ORAL | 3 refills | Status: DC

## 2017-12-30 MED ORDER — FLUCONAZOLE 150 MG PO TABS: 150.0000 mg | ORAL_TABLET | Freq: Every day | ORAL | 2 refills | Status: DC

## 2017-12-30 NOTE — Patient Instructions (Addendum)
1.  No Pap smear is done.  No further Paps are needed 2.  Mammogram is ordered 3.  Labs were obtained for screening through primary care 4.  Continue with healthy eating, exercise, and control weight loss 5.  Estradiol 1 mg daily as refills 6.  Calcium and vitamin D supplementation twice a day as recommended 7.  Return in 1 year for annual exam 8.  Diflucan 150 mg orally is prescribed for yeast infection   Health Maintenance for Postmenopausal Women Menopause is a normal process in which your reproductive ability comes to an end. This process happens gradually over a span of months to years, usually between the ages of 74 and 83. Menopause is complete when you have missed 12 consecutive menstrual periods. It is important to talk with your health care provider about some of the most common conditions that affect postmenopausal women, such as heart disease, cancer, and bone loss (osteoporosis). Adopting a healthy lifestyle and getting preventive care can help to promote your health and wellness. Those actions can also lower your chances of developing some of these common conditions. What should I know about menopause? During menopause, you may experience a number of symptoms, such as:  Moderate-to-severe hot flashes.  Night sweats.  Decrease in sex drive.  Mood swings.  Headaches.  Tiredness.  Irritability.  Memory problems.  Insomnia.  Choosing to treat or not to treat menopausal changes is an individual decision that you make with your health care provider. What should I know about hormone replacement therapy and supplements? Hormone therapy products are effective for treating symptoms that are associated with menopause, such as hot flashes and night sweats. Hormone replacement carries certain risks, especially as you become older. If you are thinking about using estrogen or estrogen with progestin treatments, discuss the benefits and risks with your health care provider. What  should I know about heart disease and stroke? Heart disease, heart attack, and stroke become more likely as you age. This may be due, in part, to the hormonal changes that your body experiences during menopause. These can affect how your body processes dietary fats, triglycerides, and cholesterol. Heart attack and stroke are both medical emergencies. There are many things that you can do to help prevent heart disease and stroke:  Have your blood pressure checked at least every 1-2 years. High blood pressure causes heart disease and increases the risk of stroke.  If you are 29-30 years old, ask your health care provider if you should take aspirin to prevent a heart attack or a stroke.  Do not use any tobacco products, including cigarettes, chewing tobacco, or electronic cigarettes. If you need help quitting, ask your health care provider.  It is important to eat a healthy diet and maintain a healthy weight. ? Be sure to include plenty of vegetables, fruits, low-fat dairy products, and lean protein. ? Avoid eating foods that are high in solid fats, added sugars, or salt (sodium).  Get regular exercise. This is one of the most important things that you can do for your health. ? Try to exercise for at least 150 minutes each week. The type of exercise that you do should increase your heart rate and make you sweat. This is known as moderate-intensity exercise. ? Try to do strengthening exercises at least twice each week. Do these in addition to the moderate-intensity exercise.  Know your numbers.Ask your health care provider to check your cholesterol and your blood glucose. Continue to have your blood tested as  directed by your health care provider.  What should I know about cancer screening? There are several types of cancer. Take the following steps to reduce your risk and to catch any cancer development as early as possible. Breast Cancer  Practice breast self-awareness. ? This means  understanding how your breasts normally appear and feel. ? It also means doing regular breast self-exams. Let your health care provider know about any changes, no matter how small.  If you are 50 or older, have a clinician do a breast exam (clinical breast exam or CBE) every year. Depending on your age, family history, and medical history, it may be recommended that you also have a yearly breast X-ray (mammogram).  If you have a family history of breast cancer, talk with your health care provider about genetic screening.  If you are at high risk for breast cancer, talk with your health care provider about having an MRI and a mammogram every year.  Breast cancer (BRCA) gene test is recommended for women who have family members with BRCA-related cancers. Results of the assessment will determine the need for genetic counseling and BRCA1 and for BRCA2 testing. BRCA-related cancers include these types: ? Breast. This occurs in males or females. ? Ovarian. ? Tubal. This may also be called fallopian tube cancer. ? Cancer of the abdominal or pelvic lining (peritoneal cancer). ? Prostate. ? Pancreatic.  Cervical, Uterine, and Ovarian Cancer Your health care provider may recommend that you be screened regularly for cancer of the pelvic organs. These include your ovaries, uterus, and vagina. This screening involves a pelvic exam, which includes checking for microscopic changes to the surface of your cervix (Pap test).  For women ages 21-65, health care providers may recommend a pelvic exam and a Pap test every three years. For women ages 81-65, they may recommend the Pap test and pelvic exam, combined with testing for human papilloma virus (HPV), every five years. Some types of HPV increase your risk of cervical cancer. Testing for HPV may also be done on women of any age who have unclear Pap test results.  Other health care providers may not recommend any screening for nonpregnant women who are  considered low risk for pelvic cancer and have no symptoms. Ask your health care provider if a screening pelvic exam is right for you.  If you have had past treatment for cervical cancer or a condition that could lead to cancer, you need Pap tests and screening for cancer for at least 20 years after your treatment. If Pap tests have been discontinued for you, your risk factors (such as having a new sexual partner) need to be reassessed to determine if you should start having screenings again. Some women have medical problems that increase the chance of getting cervical cancer. In these cases, your health care provider may recommend that you have screening and Pap tests more often.  If you have a family history of uterine cancer or ovarian cancer, talk with your health care provider about genetic screening.  If you have vaginal bleeding after reaching menopause, tell your health care provider.  There are currently no reliable tests available to screen for ovarian cancer.  Lung Cancer Lung cancer screening is recommended for adults 72-62 years old who are at high risk for lung cancer because of a history of smoking. A yearly low-dose CT scan of the lungs is recommended if you:  Currently smoke.  Have a history of at least 30 pack-years of smoking and you currently  smoke or have quit within the past 15 years. A pack-year is smoking an average of one pack of cigarettes per day for one year.  Yearly screening should:  Continue until it has been 15 years since you quit.  Stop if you develop a health problem that would prevent you from having lung cancer treatment.  Colorectal Cancer  This type of cancer can be detected and can often be prevented.  Routine colorectal cancer screening usually begins at age 71 and continues through age 31.  If you have risk factors for colon cancer, your health care provider may recommend that you be screened at an earlier age.  If you have a family history of  colorectal cancer, talk with your health care provider about genetic screening.  Your health care provider may also recommend using home test kits to check for hidden blood in your stool.  A small camera at the end of a tube can be used to examine your colon directly (sigmoidoscopy or colonoscopy). This is done to check for the earliest forms of colorectal cancer.  Direct examination of the colon should be repeated every 5-10 years until age 27. However, if early forms of precancerous polyps or small growths are found or if you have a family history or genetic risk for colorectal cancer, you may need to be screened more often.  Skin Cancer  Check your skin from head to toe regularly.  Monitor any moles. Be sure to tell your health care provider: ? About any new moles or changes in moles, especially if there is a change in a mole's shape or color. ? If you have a mole that is larger than the size of a pencil eraser.  If any of your family members has a history of skin cancer, especially at a young age, talk with your health care provider about genetic screening.  Always use sunscreen. Apply sunscreen liberally and repeatedly throughout the day.  Whenever you are outside, protect yourself by wearing long sleeves, pants, a wide-brimmed hat, and sunglasses.  What should I know about osteoporosis? Osteoporosis is a condition in which bone destruction happens more quickly than new bone creation. After menopause, you may be at an increased risk for osteoporosis. To help prevent osteoporosis or the bone fractures that can happen because of osteoporosis, the following is recommended:  If you are 69-71 years old, get at least 1,000 mg of calcium and at least 600 mg of vitamin D per day.  If you are older than age 13 but younger than age 70, get at least 1,200 mg of calcium and at least 600 mg of vitamin D per day.  If you are older than age 80, get at least 1,200 mg of calcium and at least 800 mg  of vitamin D per day.  Smoking and excessive alcohol intake increase the risk of osteoporosis. Eat foods that are rich in calcium and vitamin D, and do weight-bearing exercises several times each week as directed by your health care provider. What should I know about how menopause affects my mental health? Depression may occur at any age, but it is more common as you become older. Common symptoms of depression include:  Low or sad mood.  Changes in sleep patterns.  Changes in appetite or eating patterns.  Feeling an overall lack of motivation or enjoyment of activities that you previously enjoyed.  Frequent crying spells.  Talk with your health care provider if you think that you are experiencing depression. What should  I know about immunizations? It is important that you get and maintain your immunizations. These include:  Tetanus, diphtheria, and pertussis (Tdap) booster vaccine.  Influenza every year before the flu season begins.  Pneumonia vaccine.  Shingles vaccine.  Your health care provider may also recommend other immunizations. This information is not intended to replace advice given to you by your health care provider. Make sure you discuss any questions you have with your health care provider. Document Released: 10/16/2005 Document Revised: 03/13/2016 Document Reviewed: 05/28/2015 Elsevier Interactive Patient Education  2018 [employer].

## 2018-01-04 DIAGNOSIS — M62830 Muscle spasm of back: Secondary | ICD-10-CM | POA: Diagnosis not present

## 2018-01-04 DIAGNOSIS — G894 Chronic pain syndrome: Secondary | ICD-10-CM | POA: Diagnosis not present

## 2018-01-04 DIAGNOSIS — M545 Low back pain: Secondary | ICD-10-CM | POA: Diagnosis not present

## 2018-01-04 DIAGNOSIS — Z79891 Long term (current) use of opiate analgesic: Secondary | ICD-10-CM | POA: Diagnosis not present

## 2018-01-11 ENCOUNTER — Ambulatory Visit: Payer: PPO | Admitting: Urology

## 2018-01-11 ENCOUNTER — Encounter: Payer: Self-pay | Admitting: Urology

## 2018-01-11 VITALS — BP 123/84 | HR 98 | Ht 62.0 in | Wt 204.0 lb

## 2018-01-11 DIAGNOSIS — N3010 Interstitial cystitis (chronic) without hematuria: Secondary | ICD-10-CM

## 2018-01-11 LAB — URINALYSIS, COMPLETE
Bilirubin, UA: NEGATIVE
Glucose, UA: NEGATIVE
Ketones, UA: NEGATIVE
Leukocytes, UA: NEGATIVE
Nitrite, UA: NEGATIVE
Protein, UA: NEGATIVE
RBC, UA: NEGATIVE
Specific Gravity, UA: <1.005 — ABNORMAL LOW (ref 1.005–1.030)
Urobilinogen, Ur: 0.2 mg/dL (ref 0.2–1.0)
pH, UA: 6 (ref 5.0–7.5)

## 2018-01-11 MED ORDER — PENTOSAN POLYSULFATE SODIUM 100 MG PO CAPS: 100.0000 mg | ORAL_CAPSULE | Freq: Three times a day (TID) | ORAL | 3 refills | Status: DC

## 2018-01-11 NOTE — Progress Notes (Signed)
01/11/2018 1:14 PM   Stacy Daniel 01/01/1971 [id number]  Referring provider: [name], MD [place],  [id number]  Chief Complaint  Patient presents with  . IC    1 year follow up    HPI: 47 year old female with personal history of cystitis seen today for routine annual follow-up.  She is been on Elmeron on for greater than 15 years, 100 mg 3 times daily.  She does follow an IC diet and avoids irritating foods.  Today, she is doing very well.  Over the past year, she reports one significant flare in 10/2017 with associated frequency, urgency, dysuria which she felt may be related to an infection.  Urine culture was ultimately negative.  No issues with bleeding.  Most recent labs on 06/2017 unremarkable including CMP.  She is s/p abdominal hysterectomy/ oophorocystectomy for dysmenorrhea.  No children.  She not currently sexually active.    PMH: Past Medical History:  Diagnosis Date  . Angioedema   . Anxiety   . Bipolar 1 disorder ([place])   . Borderline personality disorder ([place])   . Breast pain in female    chronic, right  . Chronic bronchitis ([place])   . COPD (chronic obstructive pulmonary disease) ([place])   . Diabetes mellitus without complication ([place])   . Fibromyalgia   . Hypoglycemia   . IC (interstitial cystitis)   . OCD (obsessive compulsive disorder)   . Osteoarthritis   . Osteoarthritis   . PCOS (polycystic ovarian syndrome)   . Post traumatic stress disorder   . Rheumatoid arthritis ([place])   . Rheumatoid arthritis ([place])   . Schizoaffective disorder, bipolar type ([place])   . Shingles (herpes zoster) polyneuropathy   . Stroke [facility]) 02/14/16   TIA    Surgical History: Past Surgical History:  Procedure Laterality Date  . ABDOMINAL HYSTERECTOMY  06/1997   with left ovarectomy  . GANGLION CYST EXCISION  11/2010   right wrist  . OOPHORECTOMY Right 1995  . WISDOM TOOTH EXTRACTION  1988    Home Medications:  Allergies as of 01/11/2018       Reactions   Albuterol Anaphylaxis   Aripiprazole Anaphylaxis   Azelastine Anaphylaxis   Budesonide Anaphylaxis   Budesonide-formoterol Fumarate Anaphylaxis   Ciclesonide Anaphylaxis   Fluticasone Anaphylaxis   Fluticasone Furoate Anaphylaxis   Fluticasone-salmeterol Anaphylaxis   Gadolinium Derivatives Anaphylaxis   Hydrocodone-acetaminophen Anaphylaxis   Iodine Anaphylaxis   And iodide containing products   Ivp Dye [iodinated Diagnostic Agents] Anaphylaxis   Latex Anaphylaxis   Mometasone Anaphylaxis   Other Anaphylaxis   Petrochemical products   Peanut-containing Drug Products Anaphylaxis   Penicillins Anaphylaxis   Petrolatum Anaphylaxis   Yellow   Tamsulosin Anaphylaxis   Tdap [tetanus-diphth-acell Pertussis] Anaphylaxis   Venlafaxine Other (See Comments)   Migraine, causes eye to close   Zinc Oxide Anaphylaxis   Albuterol Sulfate Other (See Comments)   Gi distress   Antihistamines, Chlorpheniramine-type Other (See Comments)   Gi distress   Cetirizine & Related Other (See Comments)   Gi distress   Duloxetine Other (See Comments)   Gi distress   Fexofenadine Other (See Comments)   Gi distress   Haloperidol And Related Other (See Comments)   Gi distress   Ipratropium-albuterol Other (See Comments)   Gi distress   Ipratropium-albuterol Other (See Comments)   Levothyroxine Other (See Comments)   Gi distress   Levothyroxine Sodium Nausea And Vomiting   Metformin And Related Nausea And Vomiting   Montelukast  Sodium Other (See Comments)   Gi distress   Omeprazole Nausea And Vomiting   Sitagliptin Nausea And Vomiting   Ziprasidone Hcl Other (See Comments)   Gi distress   Adhesive [tape] Hives, Rash   Bupropion Anxiety      Medication List        Accurate as of 01/11/18 11:59 PM. Always use your most recent med list.          albuterol 108 (90 Base) MCG/ACT inhaler Commonly known as:  PROVENTIL HFA;VENTOLIN HFA Inhale 2 puffs into the lungs every 6  (six) hours as needed for wheezing or shortness of breath.   b complex vitamins tablet Take 1 tablet by mouth daily.   BD PEN NEEDLE NANO U/F 32G X 4 MM Misc Generic drug:  Insulin Pen Needle   cholecalciferol 1000 units tablet Commonly known as:  VITAMIN D Take 1,000 Units by mouth 2 (two) times daily.   diphenhydrAMINE 50 MG capsule Commonly known as:  BENADRYL Take by mouth. Takes 25 mg/4 for angioedema   EPINEPHrine 0.3 mg/0.3 mL Soaj injection Commonly known as:  EPI-PEN Inject 0.3 mLs (0.3 mg total) into the muscle once.   estradiol 1 MG tablet Commonly known as:  ESTRACE Take 1 tablet (1 mg total) by mouth daily.   FLAX SEED OIL PO Take by mouth.   fluconazole 150 MG tablet Commonly known as:  DIFLUCAN Take 1 tablet (150 mg total) by mouth daily.   Flunisolide HFA 80 MCG/ACT Aers Inhale 1 puff into the lungs daily.   folic acid 696 MCG tablet Commonly known as:  FOLVITE Take 400 mcg by mouth daily. Reported on 02/18/2016   gabapentin 300 MG capsule Commonly known as:  NEURONTIN Take 300 mg by mouth 4 (four) times daily.   Iron 142 (45 Fe) MG Tbcr Take by mouth.   ketoconazole 2 % shampoo Commonly known as:  NIZORAL   levocetirizine 5 MG tablet Commonly known as:  XYZAL Take 5 mg by mouth every evening.   levothyroxine 75 MCG tablet Commonly known as:  SYNTHROID, LEVOTHROID Take 75 mcg by mouth at bedtime.   LORazepam 1 MG tablet Commonly known as:  ATIVAN Take 1 mg by mouth 2 (two) times daily. Take morning and night.   Magnesium 250 MG Tabs Take by mouth.   montelukast 10 MG tablet Commonly known as:  SINGULAIR Take 10 mg by mouth at bedtime.   niacin 500 MG tablet Take 500 mg by mouth at bedtime.   omeprazole 20 MG capsule Commonly known as:  PRILOSEC Take 20 mg by mouth daily. At night.   ONE TOUCH ULTRA TEST test strip Generic drug:  glucose blood USE TO CHECK BLOOD SUGARS 2 TO 3 TIMES A DAY UTD FOR 90 DAYS   Oxycodone HCl 10 MG  Tabs   pentosan polysulfate 100 MG capsule Commonly known as:  ELMIRON Take 1 capsule (100 mg total) by mouth 3 (three) times daily.   Probiotic 250 MG Caps Take by mouth.   PULMICORT FLEXHALER 180 MCG/ACT inhaler Generic drug:  budesonide   ranitidine 75 MG tablet Commonly known as:  ZANTAC Take 75 mg by mouth every morning.   RELPAX 40 MG tablet Generic drug:  eletriptan TK 1 T PO AT ONSET MAY REPEAT Q 2 H  NOT TO EXCEED 5 TS  D   rizatriptan 10 MG tablet Commonly known as:  MAXALT TK 1 T PO AOS OF HEADACHE. MAY REPEAT IN 2 H IF NO  IMPROVEMENT. MAXIMUM OF 30 MG PER DAY   theophylline 300 MG 24 hr capsule Commonly known as:  THEO-24 Take 300 mg by mouth daily.   thiothixene 2 MG capsule Commonly known as:  NAVANE Take 2 mg by mouth at bedtime.   tiZANidine 2 MG tablet Commonly known as:  ZANAFLEX Take 2 mg by mouth 3 (three) times daily. Take half tablet every 4 hours.   topiramate 100 MG tablet Commonly known as:  TOPAMAX Take 100 mg by mouth daily. Take 4 tablets at night.   valACYclovir 1000 MG tablet Commonly known as:  VALTREX Take 1,000 mg by mouth 3 (three) times daily.   venlafaxine XR 150 MG 24 hr capsule Commonly known as:  EFFEXOR-XR Take 150 mg by mouth at bedtime.   VICTOZA 18 MG/3ML Sopn Generic drug:  liraglutide   VIOS AEROSOL DELIVERY SYSTEM Misc U UTD   vitamin B-12 1000 MCG tablet Commonly known as:  CYANOCOBALAMIN Take by mouth.   VITAMIN C ER PO Take 120 mg by mouth.   XOPENEX HFA 45 MCG/ACT inhaler Generic drug:  levalbuterol INL 1 TO 2 PFS PO Q 4 H PRN       Allergies:  Allergies  Allergen Reactions  . Albuterol Anaphylaxis  . Aripiprazole Anaphylaxis  . Azelastine Anaphylaxis  . Budesonide Anaphylaxis  . Budesonide-Formoterol Fumarate Anaphylaxis  . Ciclesonide Anaphylaxis  . Fluticasone Anaphylaxis  . Fluticasone Furoate Anaphylaxis  . Fluticasone-Salmeterol Anaphylaxis  . Gadolinium Derivatives Anaphylaxis  .  Hydrocodone-Acetaminophen Anaphylaxis  . Iodine Anaphylaxis    And iodide containing products   . Ivp Dye [Iodinated Diagnostic Agents] Anaphylaxis  . Latex Anaphylaxis  . Mometasone Anaphylaxis  . Other Anaphylaxis    Petrochemical products  . Peanut-Containing Drug Products Anaphylaxis  . Penicillins Anaphylaxis  . Petrolatum Anaphylaxis    Yellow   . Tamsulosin Anaphylaxis  . Tdap [Tetanus-Diphth-Acell Pertussis] Anaphylaxis  . Venlafaxine Other (See Comments)    Migraine, causes eye to close  . Zinc Oxide Anaphylaxis  . Albuterol Sulfate Other (See Comments)    Gi distress   . Antihistamines, Chlorpheniramine-Type Other (See Comments)    Gi distress   . Cetirizine & Related Other (See Comments)    Gi distress   . Duloxetine Other (See Comments)    Gi distress   . Fexofenadine Other (See Comments)    Gi distress   . Haloperidol And Related Other (See Comments)    Gi distress   . Ipratropium-Albuterol Other (See Comments)    Gi distress   . Ipratropium-Albuterol Other (See Comments)  . Levothyroxine Other (See Comments)    Gi distress   . Levothyroxine Sodium Nausea And Vomiting  . Metformin And Related Nausea And Vomiting  . Montelukast Sodium Other (See Comments)    Gi distress   . Omeprazole Nausea And Vomiting  . Sitagliptin Nausea And Vomiting  . Ziprasidone Hcl Other (See Comments)    Gi distress   . Adhesive [Tape] Hives and Rash  . Bupropion Anxiety    Family History: Family History  Problem Relation Age of Onset  . Stroke Paternal Aunt   . Diabetes Father   . Obesity Father   . Hyperthyroidism Mother   . Cancer Sister        skin  . Osteoporosis Maternal Grandmother   . Hyperthyroidism Maternal Grandmother   . Bipolar disorder Maternal Grandmother   . Heart attack Maternal Grandfather   . Diabetes Paternal Grandmother   . Heart attack Paternal Grandfather   .  Breast cancer Paternal Aunt   . Diabetes Paternal Aunt   . Breast cancer  Cousin   . Diabetes Cousin   . Bipolar disorder Cousin   . Bladder Cancer Neg Hx   . Prostate cancer Neg Hx   . Kidney cancer Neg Hx   . Ovarian cancer Neg Hx   . Colon cancer Neg Hx   . Heart disease Neg Hx     Social History:  reports that she has never smoked. She has never used smokeless tobacco. She reports that she does not drink alcohol or use drugs.  ROS: UROLOGY Frequent Urination?: Yes Hard to postpone urination?: Yes Burning/pain with urination?: No Get up at night to urinate?: Yes Leakage of urine?: Yes Urine stream starts and stops?: No Trouble starting stream?: No Do you have to strain to urinate?: No Blood in urine?: No Urinary tract infection?: No Sexually transmitted disease?: No Injury to kidneys or bladder?: No Painful intercourse?: Yes Weak stream?: No Currently pregnant?: No Vaginal bleeding?: No Last menstrual period?: n  Gastrointestinal Nausea?: No Vomiting?: No Indigestion/heartburn?: Yes Diarrhea?: Yes Constipation?: Yes  Constitutional Fever: No Night sweats?: Yes Weight loss?: No Fatigue?: Yes  Skin Skin rash/lesions?: No Itching?: Yes  Eyes Blurred vision?: Yes Double vision?: No  Ears/Nose/Throat Sore throat?: No Sinus problems?: Yes  Hematologic/Lymphatic Swollen glands?: No Easy bruising?: No  Cardiovascular Leg swelling?: Yes Chest pain?: Yes  Respiratory Cough?: No Shortness of breath?: Yes  Endocrine Excessive thirst?: Yes  Musculoskeletal Back pain?: Yes Joint pain?: Yes  Neurological Headaches?: Yes Dizziness?: Yes  Psychologic Depression?: Yes Anxiety?: Yes  Physical Exam: BP 123/84   Pulse 98   Ht 5\' 2"  (1.575 m)   Wt 204 lb (92.5 kg)   BMI 37.31 kg/m   Constitutional:  Alert and oriented, No acute distress. HEENT: [place] AT, moist mucus membranes.  Trachea midline, no masses. Cardiovascular: No clubbing, cyanosis, or edema. Respiratory: Normal respiratory effort, no increased work of  breathing. GI: Abdomen is soft, nontender, nondistended, no abdominal masses Skin: No rashes, bruises or suspicious lesions. Neurologic: Grossly intact, no focal deficits, moving all 4 extremities. Psychiatric: Normal mood and affect.  Laboratory Data: Lab Results  Component Value Date   WBC 7.8 02/14/2016   HGB 14.6 02/14/2016   HCT 45.1 02/14/2016   MCV 90.2 02/14/2016   PLT 248 02/14/2016    Lab Results  Component Value Date   CREATININE 0.57 02/14/2016    Urinalysis Results for orders placed or performed in visit on 01/11/18  Urinalysis, Complete  Result Value Ref Range   Specific Gravity, UA <1.005 (L) 1.005 - 1.030   pH, UA 6.0 5.0 - 7.5   Color, UA Yellow Yellow   Appearance Ur Clear Clear   Leukocytes, UA Negative Negative   Protein, UA Negative Negative/Trace   Glucose, UA Negative Negative   Ketones, UA Negative Negative   RBC, UA Negative Negative   Bilirubin, UA Negative Negative   Urobilinogen, Ur 0.2 0.2 - 1.0 mg/dL   Nitrite, UA Negative Negative    Pertinent Imaging: N/a  Assessment & Plan:    1. IC (interstitial cystitis) IC well controlled with diet and Elmer on Continue our on 100 mg 3 times daily Call our office or schedule appointment with any flares - Urinalysis, Complete   Return in about 1 year (around 01/12/2019) for Follow-up IC/ UA.  Hollice Espy, MD  [facility] [address], [place], [place] [address] [phone]

## 2018-02-02 DIAGNOSIS — M797 Fibromyalgia: Secondary | ICD-10-CM | POA: Diagnosis not present

## 2018-02-02 DIAGNOSIS — M5388 Other specified dorsopathies, sacral and sacrococcygeal region: Secondary | ICD-10-CM | POA: Diagnosis not present

## 2018-02-02 DIAGNOSIS — G894 Chronic pain syndrome: Secondary | ICD-10-CM | POA: Diagnosis not present

## 2018-02-03 DIAGNOSIS — K219 Gastro-esophageal reflux disease without esophagitis: Secondary | ICD-10-CM | POA: Diagnosis not present

## 2018-02-03 DIAGNOSIS — M62830 Muscle spasm of back: Secondary | ICD-10-CM | POA: Diagnosis not present

## 2018-02-03 DIAGNOSIS — J4540 Moderate persistent asthma, uncomplicated: Secondary | ICD-10-CM | POA: Diagnosis not present

## 2018-02-03 DIAGNOSIS — J302 Other seasonal allergic rhinitis: Secondary | ICD-10-CM | POA: Diagnosis not present

## 2018-02-03 DIAGNOSIS — M545 Low back pain: Secondary | ICD-10-CM | POA: Diagnosis not present

## 2018-02-03 DIAGNOSIS — G43009 Migraine without aura, not intractable, without status migrainosus: Secondary | ICD-10-CM | POA: Diagnosis not present

## 2018-02-03 DIAGNOSIS — G894 Chronic pain syndrome: Secondary | ICD-10-CM | POA: Diagnosis not present

## 2018-02-03 DIAGNOSIS — J301 Allergic rhinitis due to pollen: Secondary | ICD-10-CM | POA: Diagnosis not present

## 2018-02-03 DIAGNOSIS — J3089 Other allergic rhinitis: Secondary | ICD-10-CM | POA: Diagnosis not present

## 2018-02-04 DIAGNOSIS — J3089 Other allergic rhinitis: Secondary | ICD-10-CM | POA: Diagnosis not present

## 2018-02-04 DIAGNOSIS — E119 Type 2 diabetes mellitus without complications: Secondary | ICD-10-CM | POA: Diagnosis not present

## 2018-02-04 DIAGNOSIS — E039 Hypothyroidism, unspecified: Secondary | ICD-10-CM | POA: Diagnosis not present

## 2018-02-04 DIAGNOSIS — E559 Vitamin D deficiency, unspecified: Secondary | ICD-10-CM | POA: Diagnosis not present

## 2018-02-04 DIAGNOSIS — J4530 Mild persistent asthma, uncomplicated: Secondary | ICD-10-CM | POA: Diagnosis not present

## 2018-02-04 DIAGNOSIS — K760 Fatty (change of) liver, not elsewhere classified: Secondary | ICD-10-CM | POA: Diagnosis not present

## 2018-02-04 DIAGNOSIS — K219 Gastro-esophageal reflux disease without esophagitis: Secondary | ICD-10-CM | POA: Diagnosis not present

## 2018-02-04 DIAGNOSIS — G43109 Migraine with aura, not intractable, without status migrainosus: Secondary | ICD-10-CM | POA: Diagnosis not present

## 2018-02-04 DIAGNOSIS — E7800 Pure hypercholesterolemia, unspecified: Secondary | ICD-10-CM | POA: Diagnosis not present

## 2018-02-16 DIAGNOSIS — E559 Vitamin D deficiency, unspecified: Secondary | ICD-10-CM | POA: Diagnosis not present

## 2018-02-16 DIAGNOSIS — E039 Hypothyroidism, unspecified: Secondary | ICD-10-CM | POA: Diagnosis not present

## 2018-02-16 DIAGNOSIS — E119 Type 2 diabetes mellitus without complications: Secondary | ICD-10-CM | POA: Diagnosis not present

## 2018-02-16 DIAGNOSIS — E7800 Pure hypercholesterolemia, unspecified: Secondary | ICD-10-CM | POA: Diagnosis not present

## 2018-02-23 DIAGNOSIS — G479 Sleep disorder, unspecified: Secondary | ICD-10-CM | POA: Diagnosis not present

## 2018-02-23 DIAGNOSIS — R4189 Other symptoms and signs involving cognitive functions and awareness: Secondary | ICD-10-CM | POA: Diagnosis not present

## 2018-02-23 DIAGNOSIS — R270 Ataxia, unspecified: Secondary | ICD-10-CM | POA: Diagnosis not present

## 2018-02-23 DIAGNOSIS — R51 Headache: Secondary | ICD-10-CM | POA: Diagnosis not present

## 2018-03-02 DIAGNOSIS — L304 Erythema intertrigo: Secondary | ICD-10-CM | POA: Diagnosis not present

## 2018-03-02 DIAGNOSIS — L219 Seborrheic dermatitis, unspecified: Secondary | ICD-10-CM | POA: Diagnosis not present

## 2018-03-02 DIAGNOSIS — D229 Melanocytic nevi, unspecified: Secondary | ICD-10-CM | POA: Diagnosis not present

## 2018-03-02 DIAGNOSIS — L738 Other specified follicular disorders: Secondary | ICD-10-CM | POA: Diagnosis not present

## 2018-03-09 DIAGNOSIS — F4481 Dissociative identity disorder: Secondary | ICD-10-CM | POA: Diagnosis not present

## 2018-03-09 DIAGNOSIS — F3181 Bipolar II disorder: Secondary | ICD-10-CM | POA: Diagnosis not present

## 2018-03-09 DIAGNOSIS — F29 Unspecified psychosis not due to a substance or known physiological condition: Secondary | ICD-10-CM | POA: Diagnosis not present

## 2018-03-09 DIAGNOSIS — F4310 Post-traumatic stress disorder, unspecified: Secondary | ICD-10-CM | POA: Diagnosis not present

## 2018-03-11 ENCOUNTER — Other Ambulatory Visit: Payer: Self-pay

## 2018-03-11 MED ORDER — ESTRADIOL 1 MG PO TABS: 1.0000 mg | ORAL_TABLET | Freq: Every day | ORAL | 2 refills | Status: AC

## 2018-03-14 DIAGNOSIS — J4520 Mild intermittent asthma, uncomplicated: Secondary | ICD-10-CM | POA: Diagnosis not present

## 2018-03-14 DIAGNOSIS — K219 Gastro-esophageal reflux disease without esophagitis: Secondary | ICD-10-CM | POA: Diagnosis not present

## 2018-03-14 DIAGNOSIS — J310 Chronic rhinitis: Secondary | ICD-10-CM | POA: Diagnosis not present

## 2018-03-24 DIAGNOSIS — M545 Low back pain: Secondary | ICD-10-CM | POA: Diagnosis not present

## 2018-03-24 DIAGNOSIS — G894 Chronic pain syndrome: Secondary | ICD-10-CM | POA: Diagnosis not present

## 2018-03-24 DIAGNOSIS — M62830 Muscle spasm of back: Secondary | ICD-10-CM | POA: Diagnosis not present

## 2018-04-03 DIAGNOSIS — R569 Unspecified convulsions: Secondary | ICD-10-CM | POA: Diagnosis not present

## 2018-04-04 DIAGNOSIS — E119 Type 2 diabetes mellitus without complications: Secondary | ICD-10-CM | POA: Diagnosis not present

## 2018-04-05 DIAGNOSIS — R569 Unspecified convulsions: Secondary | ICD-10-CM | POA: Insufficient documentation

## 2018-04-22 DIAGNOSIS — G894 Chronic pain syndrome: Secondary | ICD-10-CM | POA: Diagnosis not present

## 2018-04-22 DIAGNOSIS — M545 Low back pain: Secondary | ICD-10-CM | POA: Diagnosis not present

## 2018-04-22 DIAGNOSIS — M62830 Muscle spasm of back: Secondary | ICD-10-CM | POA: Diagnosis not present

## 2018-04-22 DIAGNOSIS — Z79891 Long term (current) use of opiate analgesic: Secondary | ICD-10-CM | POA: Diagnosis not present

## 2018-04-28 DIAGNOSIS — M7912 Myalgia of auxiliary muscles, head and neck: Secondary | ICD-10-CM | POA: Diagnosis not present

## 2018-04-28 DIAGNOSIS — M797 Fibromyalgia: Secondary | ICD-10-CM | POA: Diagnosis not present

## 2018-04-29 ENCOUNTER — Other Ambulatory Visit: Payer: Self-pay | Admitting: Family Medicine

## 2018-04-29 ENCOUNTER — Ambulatory Visit
Admission: RE | Admit: 2018-04-29 | Discharge: 2018-04-29 | Disposition: A | Discharge status: Home / Self Care | Payer: PPO | Source: Ambulatory Visit | Attending: Family Medicine | Admitting: Family Medicine

## 2018-04-29 DIAGNOSIS — M79662 Pain in left lower leg: Secondary | ICD-10-CM

## 2018-04-29 DIAGNOSIS — M7989 Other specified soft tissue disorders: Secondary | ICD-10-CM

## 2018-04-29 DIAGNOSIS — M79605 Pain in left leg: Secondary | ICD-10-CM | POA: Diagnosis not present

## 2018-04-29 DIAGNOSIS — R600 Localized edema: Secondary | ICD-10-CM | POA: Diagnosis not present

## 2018-05-05 DIAGNOSIS — D2239 Melanocytic nevi of other parts of face: Secondary | ICD-10-CM | POA: Diagnosis not present

## 2018-05-05 DIAGNOSIS — L219 Seborrheic dermatitis, unspecified: Secondary | ICD-10-CM | POA: Diagnosis not present

## 2018-05-12 DIAGNOSIS — F29 Unspecified psychosis not due to a substance or known physiological condition: Secondary | ICD-10-CM | POA: Diagnosis not present

## 2018-05-12 DIAGNOSIS — F4481 Dissociative identity disorder: Secondary | ICD-10-CM | POA: Diagnosis not present

## 2018-05-12 DIAGNOSIS — F3181 Bipolar II disorder: Secondary | ICD-10-CM | POA: Diagnosis not present

## 2018-05-12 DIAGNOSIS — F4310 Post-traumatic stress disorder, unspecified: Secondary | ICD-10-CM | POA: Diagnosis not present

## 2018-05-16 ENCOUNTER — Telehealth: Payer: Self-pay | Admitting: Urology

## 2018-05-16 NOTE — Telephone Encounter (Signed)
Pt lmom stating she needs refill on her Elmeron at the [facility] in [place]. Please advise. Thanks.

## 2018-05-16 NOTE — Telephone Encounter (Signed)
Pt lmom stating she needs refill on her Elmeron at the [facility] in [place]. Please advise. Thanks.

## 2018-05-17 MED ORDER — PENTOSAN POLYSULFATE SODIUM 100 MG PO CAPS: 100.0000 mg | ORAL_CAPSULE | Freq: Three times a day (TID) | ORAL | 3 refills | Status: DC

## 2018-05-17 NOTE — Telephone Encounter (Signed)
Script sent  

## 2018-05-17 NOTE — Addendum Note (Signed)
Addended by: Tommy Rainwater on: 05/17/2018 04:51 PM   Modules accepted: Orders

## 2018-05-27 DIAGNOSIS — R569 Unspecified convulsions: Secondary | ICD-10-CM | POA: Diagnosis not present

## 2018-05-28 DIAGNOSIS — R569 Unspecified convulsions: Secondary | ICD-10-CM | POA: Diagnosis not present

## 2018-05-29 DIAGNOSIS — R569 Unspecified convulsions: Secondary | ICD-10-CM | POA: Diagnosis not present

## 2018-06-03 DIAGNOSIS — E119 Type 2 diabetes mellitus without complications: Secondary | ICD-10-CM | POA: Diagnosis not present

## 2018-06-03 DIAGNOSIS — E039 Hypothyroidism, unspecified: Secondary | ICD-10-CM | POA: Diagnosis not present

## 2018-06-16 DIAGNOSIS — M62830 Muscle spasm of back: Secondary | ICD-10-CM | POA: Diagnosis not present

## 2018-06-16 DIAGNOSIS — G894 Chronic pain syndrome: Secondary | ICD-10-CM | POA: Diagnosis not present

## 2018-06-16 DIAGNOSIS — M545 Low back pain: Secondary | ICD-10-CM | POA: Diagnosis not present

## 2018-06-22 DIAGNOSIS — R4189 Other symptoms and signs involving cognitive functions and awareness: Secondary | ICD-10-CM | POA: Diagnosis not present

## 2018-06-22 DIAGNOSIS — F29 Unspecified psychosis not due to a substance or known physiological condition: Secondary | ICD-10-CM | POA: Diagnosis not present

## 2018-06-22 DIAGNOSIS — R51 Headache: Secondary | ICD-10-CM | POA: Diagnosis not present

## 2018-06-22 DIAGNOSIS — F4310 Post-traumatic stress disorder, unspecified: Secondary | ICD-10-CM | POA: Diagnosis not present

## 2018-06-22 DIAGNOSIS — F3181 Bipolar II disorder: Secondary | ICD-10-CM | POA: Diagnosis not present

## 2018-06-22 DIAGNOSIS — F4481 Dissociative identity disorder: Secondary | ICD-10-CM | POA: Diagnosis not present

## 2018-06-22 DIAGNOSIS — G479 Sleep disorder, unspecified: Secondary | ICD-10-CM | POA: Diagnosis not present

## 2018-06-22 DIAGNOSIS — R270 Ataxia, unspecified: Secondary | ICD-10-CM | POA: Diagnosis not present

## 2018-07-27 DIAGNOSIS — R5383 Other fatigue: Secondary | ICD-10-CM | POA: Diagnosis not present

## 2018-07-27 DIAGNOSIS — Z6837 Body mass index (BMI) 37.0-37.9, adult: Secondary | ICD-10-CM | POA: Diagnosis not present

## 2018-07-27 DIAGNOSIS — M7912 Myalgia of auxiliary muscles, head and neck: Secondary | ICD-10-CM | POA: Diagnosis not present

## 2018-07-27 DIAGNOSIS — M797 Fibromyalgia: Secondary | ICD-10-CM | POA: Diagnosis not present

## 2018-07-29 DIAGNOSIS — R0609 Other forms of dyspnea: Secondary | ICD-10-CM | POA: Diagnosis not present

## 2018-07-29 DIAGNOSIS — R0989 Other specified symptoms and signs involving the circulatory and respiratory systems: Secondary | ICD-10-CM | POA: Diagnosis not present

## 2018-07-29 DIAGNOSIS — J310 Chronic rhinitis: Secondary | ICD-10-CM | POA: Diagnosis not present

## 2018-08-24 DIAGNOSIS — F4310 Post-traumatic stress disorder, unspecified: Secondary | ICD-10-CM | POA: Diagnosis not present

## 2018-08-24 DIAGNOSIS — F4481 Dissociative identity disorder: Secondary | ICD-10-CM | POA: Diagnosis not present

## 2018-08-24 DIAGNOSIS — F3181 Bipolar II disorder: Secondary | ICD-10-CM | POA: Diagnosis not present

## 2018-08-25 DIAGNOSIS — G894 Chronic pain syndrome: Secondary | ICD-10-CM | POA: Diagnosis not present

## 2018-08-25 DIAGNOSIS — M62830 Muscle spasm of back: Secondary | ICD-10-CM | POA: Diagnosis not present

## 2018-08-25 DIAGNOSIS — M545 Low back pain: Secondary | ICD-10-CM | POA: Diagnosis not present

## 2018-08-25 DIAGNOSIS — Z79891 Long term (current) use of opiate analgesic: Secondary | ICD-10-CM | POA: Diagnosis not present

## 2018-09-19 DIAGNOSIS — G43009 Migraine without aura, not intractable, without status migrainosus: Secondary | ICD-10-CM | POA: Diagnosis not present

## 2018-09-19 DIAGNOSIS — M6289 Other specified disorders of muscle: Secondary | ICD-10-CM | POA: Diagnosis not present

## 2018-09-19 DIAGNOSIS — R270 Ataxia, unspecified: Secondary | ICD-10-CM | POA: Diagnosis not present

## 2018-09-19 DIAGNOSIS — G479 Sleep disorder, unspecified: Secondary | ICD-10-CM | POA: Diagnosis not present

## 2018-10-12 DIAGNOSIS — Z6839 Body mass index (BMI) 39.0-39.9, adult: Secondary | ICD-10-CM | POA: Diagnosis not present

## 2018-10-12 DIAGNOSIS — G894 Chronic pain syndrome: Secondary | ICD-10-CM | POA: Diagnosis not present

## 2018-10-12 DIAGNOSIS — M545 Low back pain: Secondary | ICD-10-CM | POA: Diagnosis not present

## 2018-10-12 DIAGNOSIS — M62830 Muscle spasm of back: Secondary | ICD-10-CM | POA: Diagnosis not present

## 2018-10-20 DIAGNOSIS — K219 Gastro-esophageal reflux disease without esophagitis: Secondary | ICD-10-CM | POA: Diagnosis not present

## 2018-10-20 DIAGNOSIS — E7800 Pure hypercholesterolemia, unspecified: Secondary | ICD-10-CM | POA: Diagnosis not present

## 2018-10-20 DIAGNOSIS — E119 Type 2 diabetes mellitus without complications: Secondary | ICD-10-CM | POA: Diagnosis not present

## 2018-10-20 DIAGNOSIS — E559 Vitamin D deficiency, unspecified: Secondary | ICD-10-CM | POA: Diagnosis not present

## 2018-10-20 DIAGNOSIS — J4530 Mild persistent asthma, uncomplicated: Secondary | ICD-10-CM | POA: Diagnosis not present

## 2018-10-20 DIAGNOSIS — J3089 Other allergic rhinitis: Secondary | ICD-10-CM | POA: Diagnosis not present

## 2018-10-20 DIAGNOSIS — E039 Hypothyroidism, unspecified: Secondary | ICD-10-CM | POA: Diagnosis not present

## 2018-10-20 DIAGNOSIS — K760 Fatty (change of) liver, not elsewhere classified: Secondary | ICD-10-CM | POA: Diagnosis not present

## 2018-10-20 DIAGNOSIS — Z0000 Encounter for general adult medical examination without abnormal findings: Secondary | ICD-10-CM | POA: Diagnosis not present

## 2018-10-20 DIAGNOSIS — G43109 Migraine with aura, not intractable, without status migrainosus: Secondary | ICD-10-CM | POA: Diagnosis not present

## 2018-10-20 DIAGNOSIS — F319 Bipolar disorder, unspecified: Secondary | ICD-10-CM | POA: Diagnosis not present

## 2018-10-20 DIAGNOSIS — Z6841 Body Mass Index (BMI) 40.0 and over, adult: Secondary | ICD-10-CM | POA: Diagnosis not present

## 2018-10-26 DIAGNOSIS — E7800 Pure hypercholesterolemia, unspecified: Secondary | ICD-10-CM | POA: Diagnosis not present

## 2018-11-03 DIAGNOSIS — M7912 Myalgia of auxiliary muscles, head and neck: Secondary | ICD-10-CM | POA: Diagnosis not present

## 2018-11-03 DIAGNOSIS — M542 Cervicalgia: Secondary | ICD-10-CM | POA: Diagnosis not present

## 2018-11-03 DIAGNOSIS — M797 Fibromyalgia: Secondary | ICD-10-CM | POA: Diagnosis not present

## 2018-11-03 DIAGNOSIS — G894 Chronic pain syndrome: Secondary | ICD-10-CM | POA: Diagnosis not present

## 2018-11-03 DIAGNOSIS — Z6839 Body mass index (BMI) 39.0-39.9, adult: Secondary | ICD-10-CM | POA: Diagnosis not present

## 2018-11-03 DIAGNOSIS — R5383 Other fatigue: Secondary | ICD-10-CM | POA: Diagnosis not present

## 2018-11-10 DIAGNOSIS — F4481 Dissociative identity disorder: Secondary | ICD-10-CM | POA: Diagnosis not present

## 2018-11-10 DIAGNOSIS — F4310 Post-traumatic stress disorder, unspecified: Secondary | ICD-10-CM | POA: Diagnosis not present

## 2018-11-10 DIAGNOSIS — F3181 Bipolar II disorder: Secondary | ICD-10-CM | POA: Diagnosis not present

## 2018-11-15 DIAGNOSIS — M545 Low back pain: Secondary | ICD-10-CM | POA: Diagnosis not present

## 2018-11-15 DIAGNOSIS — M62830 Muscle spasm of back: Secondary | ICD-10-CM | POA: Diagnosis not present

## 2018-11-15 DIAGNOSIS — Z6839 Body mass index (BMI) 39.0-39.9, adult: Secondary | ICD-10-CM | POA: Diagnosis not present

## 2018-11-15 DIAGNOSIS — G894 Chronic pain syndrome: Secondary | ICD-10-CM | POA: Diagnosis not present

## 2019-01-04 ENCOUNTER — Encounter: Payer: PPO | Admitting: Obstetrics and Gynecology

## 2019-01-17 ENCOUNTER — Telehealth (INDEPENDENT_AMBULATORY_CARE_PROVIDER_SITE_OTHER): Payer: PPO | Admitting: Urology

## 2019-01-17 ENCOUNTER — Other Ambulatory Visit: Payer: Self-pay

## 2019-01-17 DIAGNOSIS — N3010 Interstitial cystitis (chronic) without hematuria: Secondary | ICD-10-CM

## 2019-01-17 NOTE — Progress Notes (Signed)
Virtual Visit via Video Note  I connected with Stacy Daniel on 01/17/19 at 12:30 PM EDT by a video enabled telemedicine application and verified that I am speaking with the correct person using two identifiers.  Location: Patient: home Provider: home   I discussed the limitations of evaluation and management by telemedicine and the availability of in person appointments. The patient expressed understanding and agreed to proceed.  History of Present Illness: 48 yo with a personal history of interstitial cystitis who presents today via video chat (apparently the conversation transition to telephone due to poor connectivity) for routine annual follow-up.  She reports today that she has been doing very well on Elmeron 100 mg 3 times a day.  Her symptoms are well controlled.  She is had very few flares.  Whenever she eats tomato sauce which is about once a week, she notices worsening urgency frequency.  Otherwise, her flares are limited.  She is following an IC diet as best as possible.  No true UTIs this year.  No dysuria or gross hematuria over the past 12 months.  She did have labs checked earlier this year by her PCP, personally reviewed in care everywhere with normal LFTs/creatinine.  No issues with bleeding.  We did discuss recent stressors including COVID-19 pandemic and she seems to be handling her stress well per her report.  Observations/Objective: Calm, well-appearing, interactive.  Assessment and Plan:   1. IC (interstitial cystitis) Symptoms stable on Elmer on 100 mg 3 times a day and IC diet Reviewed IC diet at length Labs stable  Continue to manage stress- discussed today We will continue this medication   Follow Up Instructions: F/u 1 year   I discussed the assessment and treatment plan with the patient. The patient was provided an opportunity to ask questions and all were answered. The patient agreed with the plan and demonstrated an understanding of the  instructions.   The patient was advised to call back or seek an in-person evaluation if the symptoms worsen or if the condition fails to improve as anticipated.  I provided 14 minutes of non-face-to-face time during this encounter.   Hollice Espy, MD

## 2019-01-26 DIAGNOSIS — F4481 Dissociative identity disorder: Secondary | ICD-10-CM | POA: Diagnosis not present

## 2019-01-26 DIAGNOSIS — F4310 Post-traumatic stress disorder, unspecified: Secondary | ICD-10-CM | POA: Diagnosis not present

## 2019-01-26 DIAGNOSIS — F3181 Bipolar II disorder: Secondary | ICD-10-CM | POA: Diagnosis not present

## 2019-02-08 DIAGNOSIS — J4530 Mild persistent asthma, uncomplicated: Secondary | ICD-10-CM | POA: Diagnosis not present

## 2019-02-09 DIAGNOSIS — E119 Type 2 diabetes mellitus without complications: Secondary | ICD-10-CM | POA: Diagnosis not present

## 2019-02-10 DIAGNOSIS — E039 Hypothyroidism, unspecified: Secondary | ICD-10-CM | POA: Diagnosis not present

## 2019-02-10 DIAGNOSIS — E119 Type 2 diabetes mellitus without complications: Secondary | ICD-10-CM | POA: Diagnosis not present

## 2019-02-10 DIAGNOSIS — Z6838 Body mass index (BMI) 38.0-38.9, adult: Secondary | ICD-10-CM | POA: Diagnosis not present

## 2019-02-10 DIAGNOSIS — E669 Obesity, unspecified: Secondary | ICD-10-CM | POA: Diagnosis not present

## 2019-02-13 DIAGNOSIS — E039 Hypothyroidism, unspecified: Secondary | ICD-10-CM | POA: Diagnosis not present

## 2019-03-01 DIAGNOSIS — M797 Fibromyalgia: Secondary | ICD-10-CM | POA: Diagnosis not present

## 2019-03-01 DIAGNOSIS — R5383 Other fatigue: Secondary | ICD-10-CM | POA: Diagnosis not present

## 2019-03-01 DIAGNOSIS — M542 Cervicalgia: Secondary | ICD-10-CM | POA: Diagnosis not present

## 2019-03-23 DIAGNOSIS — F4481 Dissociative identity disorder: Secondary | ICD-10-CM | POA: Diagnosis not present

## 2019-03-23 DIAGNOSIS — F3181 Bipolar II disorder: Secondary | ICD-10-CM | POA: Diagnosis not present

## 2019-04-27 ENCOUNTER — Other Ambulatory Visit: Payer: Self-pay | Admitting: Urology

## 2019-05-08 DIAGNOSIS — F3181 Bipolar II disorder: Secondary | ICD-10-CM | POA: Diagnosis not present

## 2019-05-08 DIAGNOSIS — F4310 Post-traumatic stress disorder, unspecified: Secondary | ICD-10-CM | POA: Diagnosis not present

## 2019-06-01 DIAGNOSIS — M797 Fibromyalgia: Secondary | ICD-10-CM | POA: Diagnosis not present

## 2019-06-01 DIAGNOSIS — M542 Cervicalgia: Secondary | ICD-10-CM | POA: Diagnosis not present

## 2019-06-01 DIAGNOSIS — R5383 Other fatigue: Secondary | ICD-10-CM | POA: Diagnosis not present

## 2019-06-16 DIAGNOSIS — E039 Hypothyroidism, unspecified: Secondary | ICD-10-CM | POA: Diagnosis not present

## 2019-06-16 DIAGNOSIS — Z6838 Body mass index (BMI) 38.0-38.9, adult: Secondary | ICD-10-CM | POA: Diagnosis not present

## 2019-06-16 DIAGNOSIS — E669 Obesity, unspecified: Secondary | ICD-10-CM | POA: Diagnosis not present

## 2019-06-16 DIAGNOSIS — E119 Type 2 diabetes mellitus without complications: Secondary | ICD-10-CM | POA: Diagnosis not present

## 2019-06-22 DIAGNOSIS — Z23 Encounter for immunization: Secondary | ICD-10-CM | POA: Diagnosis not present

## 2019-06-22 DIAGNOSIS — E559 Vitamin D deficiency, unspecified: Secondary | ICD-10-CM | POA: Diagnosis not present

## 2019-06-22 DIAGNOSIS — E039 Hypothyroidism, unspecified: Secondary | ICD-10-CM | POA: Diagnosis not present

## 2019-06-22 DIAGNOSIS — F319 Bipolar disorder, unspecified: Secondary | ICD-10-CM | POA: Diagnosis not present

## 2019-06-22 DIAGNOSIS — E119 Type 2 diabetes mellitus without complications: Secondary | ICD-10-CM | POA: Diagnosis not present

## 2019-06-22 DIAGNOSIS — J4530 Mild persistent asthma, uncomplicated: Secondary | ICD-10-CM | POA: Diagnosis not present

## 2019-06-22 DIAGNOSIS — E7800 Pure hypercholesterolemia, unspecified: Secondary | ICD-10-CM | POA: Diagnosis not present

## 2019-06-22 DIAGNOSIS — K760 Fatty (change of) liver, not elsewhere classified: Secondary | ICD-10-CM | POA: Diagnosis not present

## 2019-06-22 DIAGNOSIS — J3089 Other allergic rhinitis: Secondary | ICD-10-CM | POA: Diagnosis not present

## 2019-06-22 DIAGNOSIS — K219 Gastro-esophageal reflux disease without esophagitis: Secondary | ICD-10-CM | POA: Diagnosis not present

## 2019-06-22 DIAGNOSIS — G43109 Migraine with aura, not intractable, without status migrainosus: Secondary | ICD-10-CM | POA: Diagnosis not present

## 2019-06-27 DIAGNOSIS — E7800 Pure hypercholesterolemia, unspecified: Secondary | ICD-10-CM | POA: Diagnosis not present

## 2019-07-03 DIAGNOSIS — F4310 Post-traumatic stress disorder, unspecified: Secondary | ICD-10-CM | POA: Diagnosis not present

## 2019-07-03 DIAGNOSIS — F3181 Bipolar II disorder: Secondary | ICD-10-CM | POA: Diagnosis not present

## 2019-07-05 DIAGNOSIS — R270 Ataxia, unspecified: Secondary | ICD-10-CM | POA: Diagnosis not present

## 2019-07-05 DIAGNOSIS — G43009 Migraine without aura, not intractable, without status migrainosus: Secondary | ICD-10-CM | POA: Diagnosis not present

## 2019-08-02 DIAGNOSIS — R748 Abnormal levels of other serum enzymes: Secondary | ICD-10-CM | POA: Diagnosis not present

## 2019-08-02 DIAGNOSIS — E559 Vitamin D deficiency, unspecified: Secondary | ICD-10-CM | POA: Diagnosis not present

## 2019-08-28 DIAGNOSIS — M7912 Myalgia of auxiliary muscles, head and neck: Secondary | ICD-10-CM | POA: Diagnosis not present

## 2019-08-28 DIAGNOSIS — R5383 Other fatigue: Secondary | ICD-10-CM | POA: Diagnosis not present

## 2019-08-28 DIAGNOSIS — M797 Fibromyalgia: Secondary | ICD-10-CM | POA: Diagnosis not present

## 2019-08-28 DIAGNOSIS — Z6841 Body Mass Index (BMI) 40.0 and over, adult: Secondary | ICD-10-CM | POA: Diagnosis not present

## 2019-09-05 DIAGNOSIS — R748 Abnormal levels of other serum enzymes: Secondary | ICD-10-CM | POA: Diagnosis not present

## 2019-09-12 ENCOUNTER — Other Ambulatory Visit: Payer: Self-pay | Admitting: Family Medicine

## 2019-09-12 DIAGNOSIS — R748 Abnormal levels of other serum enzymes: Secondary | ICD-10-CM

## 2019-09-13 DIAGNOSIS — E039 Hypothyroidism, unspecified: Secondary | ICD-10-CM | POA: Diagnosis not present

## 2019-09-13 DIAGNOSIS — E119 Type 2 diabetes mellitus without complications: Secondary | ICD-10-CM | POA: Diagnosis not present

## 2019-09-13 DIAGNOSIS — R748 Abnormal levels of other serum enzymes: Secondary | ICD-10-CM | POA: Diagnosis not present

## 2019-09-19 ENCOUNTER — Ambulatory Visit
Admission: RE | Admit: 2019-09-19 | Discharge: 2019-09-19 | Disposition: A | Discharge status: Home / Self Care | Payer: PPO | Source: Ambulatory Visit | Attending: Family Medicine | Admitting: Family Medicine

## 2019-09-19 ENCOUNTER — Other Ambulatory Visit: Payer: Self-pay

## 2019-09-19 DIAGNOSIS — R748 Abnormal levels of other serum enzymes: Secondary | ICD-10-CM | POA: Diagnosis not present

## 2019-09-19 DIAGNOSIS — K7689 Other specified diseases of liver: Secondary | ICD-10-CM | POA: Diagnosis not present

## 2019-09-19 DIAGNOSIS — K8020 Calculus of gallbladder without cholecystitis without obstruction: Secondary | ICD-10-CM | POA: Diagnosis not present

## 2019-09-22 DIAGNOSIS — E039 Hypothyroidism, unspecified: Secondary | ICD-10-CM | POA: Diagnosis not present

## 2019-09-22 DIAGNOSIS — Z6838 Body mass index (BMI) 38.0-38.9, adult: Secondary | ICD-10-CM | POA: Diagnosis not present

## 2019-09-22 DIAGNOSIS — E119 Type 2 diabetes mellitus without complications: Secondary | ICD-10-CM | POA: Diagnosis not present

## 2019-09-22 DIAGNOSIS — E669 Obesity, unspecified: Secondary | ICD-10-CM | POA: Diagnosis not present

## 2019-09-25 ENCOUNTER — Encounter: Payer: Self-pay | Admitting: Family Medicine

## 2019-09-25 DIAGNOSIS — F4310 Post-traumatic stress disorder, unspecified: Secondary | ICD-10-CM | POA: Diagnosis not present

## 2019-09-25 DIAGNOSIS — F4481 Dissociative identity disorder: Secondary | ICD-10-CM | POA: Diagnosis not present

## 2019-09-25 DIAGNOSIS — F3181 Bipolar II disorder: Secondary | ICD-10-CM | POA: Diagnosis not present

## 2019-10-15 ENCOUNTER — Ambulatory Visit: Payer: Self-pay

## 2019-11-01 DIAGNOSIS — E039 Hypothyroidism, unspecified: Secondary | ICD-10-CM | POA: Diagnosis not present

## 2019-11-01 DIAGNOSIS — F319 Bipolar disorder, unspecified: Secondary | ICD-10-CM | POA: Diagnosis not present

## 2019-11-01 DIAGNOSIS — J4530 Mild persistent asthma, uncomplicated: Secondary | ICD-10-CM | POA: Diagnosis not present

## 2019-11-01 DIAGNOSIS — G43109 Migraine with aura, not intractable, without status migrainosus: Secondary | ICD-10-CM | POA: Diagnosis not present

## 2019-11-01 DIAGNOSIS — Z0000 Encounter for general adult medical examination without abnormal findings: Secondary | ICD-10-CM | POA: Diagnosis not present

## 2019-11-01 DIAGNOSIS — J3089 Other allergic rhinitis: Secondary | ICD-10-CM | POA: Diagnosis not present

## 2019-11-01 DIAGNOSIS — E7800 Pure hypercholesterolemia, unspecified: Secondary | ICD-10-CM | POA: Diagnosis not present

## 2019-11-01 DIAGNOSIS — K219 Gastro-esophageal reflux disease without esophagitis: Secondary | ICD-10-CM | POA: Diagnosis not present

## 2019-11-01 DIAGNOSIS — E559 Vitamin D deficiency, unspecified: Secondary | ICD-10-CM | POA: Diagnosis not present

## 2019-11-01 DIAGNOSIS — K760 Fatty (change of) liver, not elsewhere classified: Secondary | ICD-10-CM | POA: Diagnosis not present

## 2019-11-01 DIAGNOSIS — Z1231 Encounter for screening mammogram for malignant neoplasm of breast: Secondary | ICD-10-CM | POA: Diagnosis not present

## 2019-11-01 DIAGNOSIS — E119 Type 2 diabetes mellitus without complications: Secondary | ICD-10-CM | POA: Diagnosis not present

## 2019-11-02 ENCOUNTER — Ambulatory Visit: Payer: PPO

## 2019-11-03 ENCOUNTER — Ambulatory Visit: Payer: PPO

## 2019-11-03 ENCOUNTER — Other Ambulatory Visit: Payer: Self-pay | Admitting: Family Medicine

## 2019-11-03 DIAGNOSIS — Z1231 Encounter for screening mammogram for malignant neoplasm of breast: Secondary | ICD-10-CM

## 2019-11-08 ENCOUNTER — Other Ambulatory Visit: Payer: Self-pay

## 2019-11-08 ENCOUNTER — Ambulatory Visit (INDEPENDENT_AMBULATORY_CARE_PROVIDER_SITE_OTHER): Payer: PPO | Admitting: Gastroenterology

## 2019-11-08 ENCOUNTER — Encounter: Payer: Self-pay | Admitting: Gastroenterology

## 2019-11-08 VITALS — BP 123/82 | HR 54 | Temp 98.3°F | Ht 62.0 in | Wt 237.8 lb

## 2019-11-08 DIAGNOSIS — K760 Fatty (change of) liver, not elsewhere classified: Secondary | ICD-10-CM

## 2019-11-08 DIAGNOSIS — R748 Abnormal levels of other serum enzymes: Secondary | ICD-10-CM | POA: Diagnosis not present

## 2019-11-08 NOTE — Progress Notes (Signed)
Gastroenterology Consultation  Referring Provider:     [name], MD Primary Care Physician:  [name], MD Primary Gastroenterologist:  Dr. Allen Norris     Reason for Consultation:     Abnormal liver enzymes        HPI:   Annmargaret Carusone is a 49 y.o. y/o female referred for consultation & management of abnormal liver enzymes by Dr. Ellison Hughs, Chrissie Noa, MD.  This patient came to see me in the past for abnormal liver enzymes.  The patient was recommended to lose weight and follow-up for the abnormal liver enzymes.  She had a fibrosis scan that showed her to be F2 and F3.  The patient has had normalization of her liver enzymes when she loses weight.  When she followed up with me back in 2017 after her initial visit her liver enzymes were still elevated and she had not lost any weight at that time.  The patient was recommended to continue to try and lose weight and avoid NSAIDs.  The patient has had an extensive work-up in the past at [facility] for her abnormal liver enzymes.  Her most recent ultrasound showed her to have continued fatty liver. The patient reports that she has tried to lose weight and barely eats anything at this time.  She states she only eats 1 meal a day and has cereal. The patient's right upper quadrant pain is not associated with eating or drinking.  She reports that she stays away from fatty foods or greasy foods.  The patient denies any change in bowel habits and her last colonoscopy was in 2012.  She was told she needed a repeat colonoscopy in 10 years.  Past Medical History:  Diagnosis Date  . Angioedema   . Anxiety   . Bipolar 1 disorder ([place])   . Borderline personality disorder ([place])   . Breast pain in female    chronic, right  . Chronic bronchitis ([place])   . COPD (chronic obstructive pulmonary disease) ([place])   . Diabetes mellitus without complication ([place])   . Fibromyalgia   . Hypoglycemia   . IC (interstitial cystitis)   . OCD (obsessive compulsive  disorder)   . Osteoarthritis   . Osteoarthritis   . PCOS (polycystic ovarian syndrome)   . Post traumatic stress disorder   . Rheumatoid arthritis ([place])   . Rheumatoid arthritis ([place])   . Schizoaffective disorder, bipolar type ([place])   . Shingles (herpes zoster) polyneuropathy   . Stroke [facility]) 02/14/16   TIA    Past Surgical History:  Procedure Laterality Date  . ABDOMINAL HYSTERECTOMY  06/1997   with left ovarectomy  . GANGLION CYST EXCISION  11/2010   right wrist  . OOPHORECTOMY Right 1995  . [place]    Prior to Admission medications   Medication Sig Start Date End Date Taking? Authorizing Provider  albuterol (PROVENTIL HFA;VENTOLIN HFA) 108 (90 BASE) MCG/ACT inhaler Inhale 2 puffs into the lungs every 6 (six) hours as needed for wheezing or shortness of breath.    [provider]  Ascorbic Acid (VITAMIN C ER PO) Take 120 mg by mouth.    [provider]  b complex vitamins tablet Take 1 tablet by mouth daily.    [provider]  BD PEN NEEDLE NANO U/F 32G X 4 MM MISC  07/08/16   [provider]  cholecalciferol (VITAMIN D) 1000 units tablet Take 1,000 Units by mouth 2 (two) times daily.  [provider]  diphenhydrAMINE (BENADRYL) 50 MG capsule Take by mouth. Takes 25 mg/4 for angioedema    [provider]  ELMIRON 100 MG capsule TAKE 1 CAPSULE BY MOUTH THREE TIMES A DAY 04/28/19   McGowan, Larene [place] A, PA-C  EPINEPHrine 0.3 mg/0.3 mL IJ SOAJ injection Inject 0.3 mLs (0.3 mg total) into the muscle once. Patient not taking: Reported on 01/11/2018 08/17/15   Nance Pear, MD  estradiol (ESTRACE) 1 MG tablet Take 1 tablet (1 mg total) by mouth daily. 03/11/18   Defrancesco, Alanda Slim, MD  Ferrous Sulfate (IRON) 142 (45 Fe) MG TBCR Take by mouth.    [provider]  Flaxseed, Linseed, (FLAX SEED OIL PO) Take by mouth.    [provider]  fluconazole (DIFLUCAN) 150 MG tablet Take 1 tablet (150 mg  total) by mouth daily. 12/30/17   Defrancesco, Alanda Slim, MD  Flunisolide HFA 80 MCG/ACT AERS Inhale 1 puff into the lungs daily.    [provider]  folic acid (FOLVITE) A999333 MCG tablet Take 400 mcg by mouth daily. Reported on 02/18/2016    [provider]  gabapentin (NEURONTIN) 300 MG capsule Take 300 mg by mouth 4 (four) times daily.    [provider]  ketoconazole (NIZORAL) 2 % shampoo  11/26/15   [provider]  levocetirizine (XYZAL) 5 MG tablet Take 5 mg by mouth every evening.    [provider]  levothyroxine (SYNTHROID, LEVOTHROID) 75 MCG tablet Take 75 mcg by mouth at bedtime.    [provider]  LORazepam (ATIVAN) 1 MG tablet Take 1 mg by mouth 2 (two) times daily. Take morning and night.    [provider]  Magnesium 250 MG TABS Take by mouth.    [provider]  montelukast (SINGULAIR) 10 MG tablet Take 10 mg by mouth at bedtime.    [provider]  Nebulizers ([place]) MISC U UTD 08/29/15   [provider]  niacin 500 MG tablet Take 500 mg by mouth at bedtime.    [provider]  omeprazole (PRILOSEC) 20 MG capsule Take 20 mg by mouth daily. At night.    [provider]  ONE TOUCH ULTRA TEST test strip USE TO CHECK BLOOD SUGARS 2 TO 3 TIMES A DAY UTD FOR 90 DAYS 12/16/15   [provider]  Oxycodone HCl 10 MG TABS  11/12/16   [provider]  Doug Sou 180 MCG/ACT inhaler  07/31/16   [provider]  ranitidine (ZANTAC) 75 MG tablet Take 75 mg by mouth every morning.     [provider]  RELPAX 40 MG tablet TK 1 T PO AT ONSET MAY REPEAT Q 2 H  NOT TO EXCEED 5 TS  D 08/11/16   [provider]  rizatriptan (MAXALT) 10 MG tablet TK 1 T PO AOS OF HEADACHE. MAY REPEAT IN 2 H IF NO IMPROVEMENT. MAXIMUM OF 30 MG PER DAY 06/11/16   [provider]  Saccharomyces boulardii (PROBIOTIC) 250 MG CAPS Take by  mouth.    [provider]  theophylline (THEO-24) 300 MG 24 hr capsule Take 300 mg by mouth daily.    [provider]  thiothixene (NAVANE) 2 MG capsule Take 2 mg by mouth at bedtime.    [provider]  tiZANidine (ZANAFLEX) 2 MG tablet Take 2 mg by mouth 3 (three) times daily. Take half tablet every 4 hours.    [provider]  topiramate (  TOPAMAX) 100 MG tablet Take 100 mg by mouth daily. Take 4 tablets at night.    [provider]  valACYclovir (VALTREX) 1000 MG tablet Take 1,000 mg by mouth 3 (three) times daily.    [provider]  venlafaxine XR (EFFEXOR-XR) 150 MG 24 hr capsule Take 150 mg by mouth at bedtime.    [provider]  VICTOZA 18 MG/3ML SOPN  07/09/16   [provider]  vitamin B-12 (CYANOCOBALAMIN) 1000 MCG tablet Take by mouth.    [provider]  XOPENEX HFA 45 MCG/ACT inhaler INL 1 TO 2 PFS PO Q 4 H PRN 07/09/15   [provider]    Family History  Problem Relation Age of Onset  . Stroke Paternal Aunt   . Diabetes Father   . Obesity Father   . Hyperthyroidism Mother   . Cancer Sister        skin  . Osteoporosis Maternal Grandmother   . Hyperthyroidism Maternal Grandmother   . Bipolar disorder Maternal Grandmother   . Heart attack Maternal Grandfather   . Diabetes Paternal Grandmother   . Heart attack Paternal Grandfather   . Breast cancer Paternal Aunt   . Diabetes Paternal Aunt   . Breast cancer Cousin   . Diabetes Cousin   . Bipolar disorder Cousin   . Bladder Cancer Neg Hx   . Prostate cancer Neg Hx   . Kidney cancer Neg Hx   . Ovarian cancer Neg Hx   . Colon cancer Neg Hx   . Heart disease Neg Hx      Social History   Tobacco Use  . Smoking status: Never Smoker  . Smokeless tobacco: Never Used  Substance Use Topics  . Alcohol use: No    Comment: quit 2011  . Drug use: No    Allergies as of 11/08/2019 - Review Complete 01/11/2018  Allergen Reaction Noted   . Albuterol Anaphylaxis 08/17/2015  . Aripiprazole Anaphylaxis 08/17/2015  . Azelastine Anaphylaxis 08/17/2015  . Budesonide Anaphylaxis 08/17/2015  . Budesonide-formoterol fumarate Anaphylaxis 08/17/2015  . Ciclesonide Anaphylaxis 08/17/2015  . Fluticasone Anaphylaxis 08/17/2015  . Fluticasone furoate Anaphylaxis 08/17/2015  . Fluticasone-salmeterol Anaphylaxis 08/17/2015  . Gadolinium derivatives Anaphylaxis 08/17/2015  . Hydrocodone-acetaminophen Anaphylaxis 08/17/2015  . Iodine Anaphylaxis 08/17/2015  . Ivp dye [iodinated diagnostic agents] Anaphylaxis 08/17/2015  . Latex Anaphylaxis 08/17/2015  . Mometasone Anaphylaxis 08/17/2015  . Other Anaphylaxis 08/17/2015  . Peanut-containing drug products Anaphylaxis 08/17/2015  . Penicillins Anaphylaxis 08/17/2015  . Petrolatum Anaphylaxis 08/17/2015  . Tamsulosin Anaphylaxis 08/17/2015  . Tdap [tetanus-diphth-acell pertussis] Anaphylaxis 08/17/2015  . Venlafaxine Other (See Comments) 08/17/2015  . Zinc oxide Anaphylaxis 08/17/2015  . Albuterol sulfate Other (See Comments) 08/17/2015  . Antihistamines, chlorpheniramine-type Other (See Comments) 08/17/2015  . Cetirizine & related Other (See Comments) 08/17/2015  . Duloxetine Other (See Comments) 08/17/2015  . Fexofenadine Other (See Comments) 08/17/2015  . Haloperidol and related Other (See Comments) 08/17/2015  . Ipratropium-albuterol Other (See Comments) 08/17/2015  . Ipratropium-albuterol Other (See Comments) 03/06/2014  . Levothyroxine Other (See Comments) 08/17/2015  . Levothyroxine sodium Nausea And Vomiting 08/17/2015  . Metformin and related Nausea And Vomiting 08/17/2015  . Montelukast sodium Other (See Comments) 08/17/2015  . Omeprazole Nausea And Vomiting 08/17/2015  . Sitagliptin Nausea And Vomiting 08/17/2015  . Ziprasidone hcl Other (See Comments) 08/17/2015  . Adhesive [tape] Hives and Rash 08/17/2015  . Bupropion Anxiety 08/17/2015    Review of Systems:    All  systems reviewed and negative  except where noted in HPI.   Physical Exam:  There were no vitals taken for this visit. No LMP recorded. Patient has had a hysterectomy. General:   Alert,  Well-developed, well-nourished, pleasant and cooperative in NAD Head:  Normocephalic and atraumatic. Eyes:  Sclera clear, no icterus.   Conjunctiva pink. Ears:  Normal auditory acuity. Neck:  Supple; no masses or thyromegaly. Lungs:  Respirations even and unlabored.  Clear throughout to auscultation.   No wheezes, crackles, or rhonchi. No acute distress. Heart:  Regular rate and rhythm; no murmurs, clicks, rubs, or gallops. Abdomen:  Normal bowel sounds.  No bruits.  Soft, positive tenderness to palpation over the ribs with no abdominal, gallbladder or liver tenderness and non-distended without masses, hepatosplenomegaly or hernias noted.  No guarding or rebound tenderness.  .   Rectal:  Deferred.  Pulses:  Normal pulses noted. Extremities:  No clubbing or edema.  No cyanosis. Neurologic:  Alert and oriented x3;  grossly normal neurologically. Skin:  Intact without significant lesions or rashes.  No jaundice. Lymph Nodes:  No significant cervical adenopathy. Psych:  Alert and cooperative. Normal mood and affect.  Imaging Studies: No results found.  Assessment and Plan:   Michalle Kallenbach is a 49 y.o. y/o female who comes in today with right upper quadrant pain.  The patient was found to have gallstones and has been told that 25% of the population likely has gallstones and a very rarely cause any problems.  Her abdominal pain is clearly skeletal with tenderness along the entire path of the lower ribs on the right.   The patient has been told that due to her fatty liver and history of F2 and F3 on her previous elastography she should consider weight loss options including medication versus gastric bypass.  The patient will be set up for a repeat elastography of the liver to look for any progression of her  liver disease.  The patient has been explained the plan and agrees with it.    Lucilla Lame, MD. Marval Regal    Note: This dictation was prepared with Dragon dictation along with smaller phrase technology. Any transcriptional errors that result from this process are unintentional.

## 2019-11-15 ENCOUNTER — Other Ambulatory Visit: Payer: Self-pay

## 2019-11-15 ENCOUNTER — Ambulatory Visit
Admission: RE | Admit: 2019-11-15 | Discharge: 2019-11-15 | Disposition: A | Discharge status: Home / Self Care | Payer: PPO | Source: Ambulatory Visit | Attending: Gastroenterology | Admitting: Gastroenterology

## 2019-11-15 DIAGNOSIS — K8020 Calculus of gallbladder without cholecystitis without obstruction: Secondary | ICD-10-CM | POA: Diagnosis not present

## 2019-11-15 DIAGNOSIS — R748 Abnormal levels of other serum enzymes: Secondary | ICD-10-CM

## 2019-11-20 ENCOUNTER — Telehealth: Payer: Self-pay

## 2019-11-20 NOTE — Telephone Encounter (Signed)
Pt notified of [place] results via [place].

## 2019-11-20 NOTE — Telephone Encounter (Signed)
-----   Message from Lucilla Lame, MD sent at 11/19/2019 11:30 PM EDT ----- Let the patient know that her elastography did not show any advanced liver disease but she should still continue to try to get the fat out of her liver by losing weight.

## 2019-11-27 DIAGNOSIS — R5383 Other fatigue: Secondary | ICD-10-CM | POA: Diagnosis not present

## 2019-11-27 DIAGNOSIS — M7912 Myalgia of auxiliary muscles, head and neck: Secondary | ICD-10-CM | POA: Diagnosis not present

## 2019-11-27 DIAGNOSIS — M797 Fibromyalgia: Secondary | ICD-10-CM | POA: Diagnosis not present

## 2019-12-06 ENCOUNTER — Ambulatory Visit
Admission: RE | Admit: 2019-12-06 | Discharge: 2019-12-06 | Disposition: A | Discharge status: Home / Self Care | Payer: PPO | Source: Ambulatory Visit | Attending: Family Medicine | Admitting: Family Medicine

## 2019-12-06 ENCOUNTER — Other Ambulatory Visit: Payer: Self-pay

## 2019-12-06 ENCOUNTER — Other Ambulatory Visit: Payer: Self-pay | Admitting: Family Medicine

## 2019-12-06 DIAGNOSIS — Z1231 Encounter for screening mammogram for malignant neoplasm of breast: Secondary | ICD-10-CM

## 2019-12-18 DIAGNOSIS — F3181 Bipolar II disorder: Secondary | ICD-10-CM | POA: Diagnosis not present

## 2019-12-18 DIAGNOSIS — F4310 Post-traumatic stress disorder, unspecified: Secondary | ICD-10-CM | POA: Diagnosis not present

## 2019-12-22 DIAGNOSIS — E119 Type 2 diabetes mellitus without complications: Secondary | ICD-10-CM | POA: Diagnosis not present

## 2019-12-22 DIAGNOSIS — E669 Obesity, unspecified: Secondary | ICD-10-CM | POA: Diagnosis not present

## 2019-12-22 DIAGNOSIS — E039 Hypothyroidism, unspecified: Secondary | ICD-10-CM | POA: Diagnosis not present

## 2019-12-22 DIAGNOSIS — Z6838 Body mass index (BMI) 38.0-38.9, adult: Secondary | ICD-10-CM | POA: Diagnosis not present

## 2020-01-10 DIAGNOSIS — R270 Ataxia, unspecified: Secondary | ICD-10-CM | POA: Diagnosis not present

## 2020-01-10 DIAGNOSIS — G43009 Migraine without aura, not intractable, without status migrainosus: Secondary | ICD-10-CM | POA: Diagnosis not present

## 2020-01-10 DIAGNOSIS — G479 Sleep disorder, unspecified: Secondary | ICD-10-CM | POA: Diagnosis not present

## 2020-01-10 DIAGNOSIS — F411 Generalized anxiety disorder: Secondary | ICD-10-CM | POA: Diagnosis not present

## 2020-01-10 DIAGNOSIS — M6289 Other specified disorders of muscle: Secondary | ICD-10-CM | POA: Diagnosis not present

## 2020-01-16 NOTE — Progress Notes (Signed)
01/17/20 9:55 PM   Stacy Daniel 07-Mar-1971 [id number]  Referring provider: [name], [place],  [place] [address] No chief complaint on file.   HPI: Stacy Daniel is a 49 y.o. F who with a personal history of interstitial cystitis returns today for a 1 year f/u.   Since last visit, she was diagnosed w/ elevated LFTs by Dr. Allen Norris at [facility]. She had an extensive workup including a hepatitis panel and elastography. She is currently on Elmeron 100 mg 3x a day.  She reports of 2 flares this year exacerbated by stress. She eats the same food so she ruled that out. She is doing well on Elmer however worried regarding new news coming out regarding medication.   She also reports of urgency q 20 minutes during flair only, otherwise unremarkable.  PMH: Past Medical History:  Diagnosis Date  . Angioedema   . Anxiety   . Bipolar 1 disorder ([place])   . Borderline personality disorder ([place])   . Breast pain in female    chronic, right  . Chronic bronchitis ([place])   . COPD (chronic obstructive pulmonary disease) ([place])   . Diabetes mellitus without complication ([place])   . Fibromyalgia   . Hypoglycemia   . IC (interstitial cystitis)   . OCD (obsessive compulsive disorder)   . Osteoarthritis   . Osteoarthritis   . PCOS (polycystic ovarian syndrome)   . Post traumatic stress disorder   . Rheumatoid arthritis ([place])   . Rheumatoid arthritis ([place])   . Schizoaffective disorder, bipolar type ([place])   . Shingles (herpes zoster) polyneuropathy   . Stroke [facility]) 02/14/16   TIA    Surgical History: Past Surgical History:  Procedure Laterality Date  . ABDOMINAL HYSTERECTOMY  06/1997   with left ovarectomy  . GANGLION CYST EXCISION  11/2010   right wrist  . OOPHORECTOMY Right 1995  . WISDOM TOOTH EXTRACTION  1988    Home Medications:  Allergies as of 01/17/2020      Reactions   Albuterol Anaphylaxis   Aripiprazole Anaphylaxis   Azelastine  Anaphylaxis   Budesonide Anaphylaxis   Budesonide-formoterol Fumarate Anaphylaxis   Ciclesonide Anaphylaxis   Fluticasone Anaphylaxis   Fluticasone Furoate Anaphylaxis   Fluticasone-salmeterol Anaphylaxis   Gadolinium Derivatives Anaphylaxis   Hydrocodone-acetaminophen Anaphylaxis   Iodine Anaphylaxis   And iodide containing products   Ivp Dye [iodinated Diagnostic Agents] Anaphylaxis   Latex Anaphylaxis   Mometasone Anaphylaxis   Other Anaphylaxis   Petrochemical products   Peanut-containing Drug Products Anaphylaxis   Penicillins Anaphylaxis   Petrolatum Anaphylaxis   Yellow   Petroleum Distillate Anaphylaxis, Rash   Other reaction(s): Angioedema Teaching laboratory technician) (plastics)   Tamsulosin Anaphylaxis   Tdap [tetanus-diphth-acell Pertussis] Anaphylaxis   Venlafaxine Other (See Comments)   Migraine, causes eye to close   Zinc Oxide Anaphylaxis   Albuterol Sulfate Other (See Comments)   Gi distress   Antihistamines, Chlorpheniramine-type Other (See Comments)   Gi distress   Cetirizine & Related Other (See Comments)   Gi distress   Diphenhydramine    Other reaction(s): Other (See Comments), Other (See Comments) Doesn't work-can take Benadryl Doesn't work-can take Benadryl   Duloxetine Other (See Comments)   Gi distress   Fexofenadine Other (See Comments)   Gi distress   Gluten Meal    Other reaction(s): Other (See Comments), Other (See Comments) Doesn't digest Doesn't digest   Haloperidol And Related Other (See Comments)   Gi distress   Ipratropium-albuterol Other (  See Comments)   Gi distress   Ipratropium-albuterol Other (See Comments)   Lac Bovis    Other reaction(s): Angioedema   Levothyroxine Other (See Comments)   Gi distress   Levothyroxine Sodium Nausea And Vomiting   Metformin And Related Nausea And Vomiting   Montelukast    Other reaction(s): Mental Status Changes (intolerance), Other (See Comments)   Montelukast Sodium Other (See Comments)   Gi distress    Omeprazole Nausea And Vomiting   Pineapple Other (See Comments)   Other reaction(s): Other (See Comments) unknown unknown   Red Dye Other (See Comments)   Other reaction(s): GI Upset (intolerance), Other (See Comments) migraines migraines   Sitagliptin Nausea And Vomiting   Ziprasidone Hcl Other (See Comments)   Gi distress   Adhesive [tape] Hives, Rash   Bupropion Anxiety      Medication List       Accurate as of Jan 16, 2020  9:55 PM. If you have any questions, ask your nurse or doctor.        Ajovy 225 MG/1.5ML Sosy Generic drug: Fremanezumab-vfrm   albuterol 108 (90 Base) MCG/ACT inhaler Commonly known as: VENTOLIN HFA Inhale 2 puffs into the lungs every 6 (six) hours as needed for wheezing or shortness of breath.   b complex vitamins tablet Take 1 tablet by mouth daily.   BD Pen Needle Nano U/F 32G X 4 MM Misc Generic drug: Insulin Pen Needle   cholecalciferol 1000 units tablet Commonly known as: VITAMIN D Take 1,000 Units by mouth 2 (two) times daily.   diphenhydrAMINE 50 MG capsule Commonly known as: BENADRYL Take by mouth. Takes 25 mg/4 for angioedema   Elmiron 100 MG capsule Generic drug: pentosan polysulfate TAKE 1 CAPSULE BY MOUTH THREE TIMES A DAY   EPINEPHrine 0.3 mg/0.3 mL Soaj injection Commonly known as: EPI-PEN Inject 0.3 mLs (0.3 mg total) into the muscle once.   escitalopram 20 MG tablet Commonly known as: LEXAPRO Take 20 mg by mouth daily.   estradiol 1 MG tablet Commonly known as: ESTRACE Take 1 tablet (1 mg total) by mouth daily.   FLAX SEED OIL PO Take by mouth.   fluconazole 150 MG tablet Commonly known as: Diflucan Take 1 tablet (150 mg total) by mouth daily.   Flunisolide HFA 80 MCG/ACT Aers Inhale 1 puff into the lungs daily.   folic acid 637 MCG tablet Commonly known as: FOLVITE Take 400 mcg by mouth daily. Reported on 02/18/2016   gabapentin 300 MG capsule Commonly known as: NEURONTIN Take 300 mg by mouth 4 (four)  times daily.   Iron 142 (45 Fe) MG Tbcr Take by mouth.   ketoconazole 2 % shampoo Commonly known as: NIZORAL   levocetirizine 5 MG tablet Commonly known as: XYZAL Take 5 mg by mouth every evening.   levothyroxine 75 MCG tablet Commonly known as: SYNTHROID Take 75 mcg by mouth at bedtime.   LORazepam 1 MG tablet Commonly known as: ATIVAN Take 1 mg by mouth 2 (two) times daily. Take morning and night.   Magnesium 250 MG Tabs Take by mouth.   montelukast 10 MG tablet Commonly known as: SINGULAIR Take 10 mg by mouth at bedtime.   niacin 500 MG tablet Take 500 mg by mouth at bedtime.   omeprazole 20 MG capsule Commonly known as: PRILOSEC Take 20 mg by mouth daily. At night.   ONE TOUCH ULTRA 2 w/Device Kit BY XX ROUTE AS DIRECTED   ONE TOUCH ULTRA TEST test strip Generic  drug: glucose blood USE TO CHECK BLOOD SUGARS 2 TO 3 TIMES A DAY UTD FOR 90 DAYS   OneTouch Delica Lancets 86L Misc Apply 1 each topically 2 (two) times daily.   Oxycodone HCl 10 MG Tabs   Probiotic 250 MG Caps Take by mouth.   Pulmicort Flexhaler 180 MCG/ACT inhaler Generic drug: budesonide   ranitidine 75 MG tablet Commonly known as: ZANTAC Take 75 mg by mouth every morning.   Relpax 40 MG tablet Generic drug: eletriptan TK 1 T PO AT ONSET MAY REPEAT Q 2 H  NOT TO EXCEED 5 TS  D   rizatriptan 10 MG tablet Commonly known as: MAXALT TK 1 T PO AOS OF HEADACHE. MAY REPEAT IN 2 H IF NO IMPROVEMENT. MAXIMUM OF 30 MG PER DAY   theophylline 300 MG 24 hr capsule Commonly known as: THEO-24 Take 300 mg by mouth daily.   thiothixene 2 MG capsule Commonly known as: NAVANE Take 2 mg by mouth at bedtime.   thiothixene 1 MG capsule Commonly known as: NAVANE TAKE 1 CAPSULE BY MOUTH 3 TIMES A DAY AND TAKE 3 CAPSULES AT BEDTIME AS DIRECTED   tiZANidine 2 MG tablet Commonly known as: ZANAFLEX Take 2 mg by mouth 3 (three) times daily. Take half tablet every 4 hours.   topiramate 100 MG  tablet Commonly known as: TOPAMAX Take 100 mg by mouth daily. Take 4 tablets at night.   valACYclovir 1000 MG tablet Commonly known as: VALTREX Take 1,000 mg by mouth 3 (three) times daily.   venlafaxine XR 150 MG 24 hr capsule Commonly known as: EFFEXOR-XR Take 150 mg by mouth at bedtime.   Victoza 18 MG/3ML Sopn Generic drug: liraglutide   Vios Aerosol Delivery System Misc U UTD   vitamin B-12 1000 MCG tablet Commonly known as: CYANOCOBALAMIN Take by mouth.   VITAMIN C ER PO Take 120 mg by mouth.   Vraylar 4.5 MG Caps Generic drug: Cariprazine HCl Take 1 capsule by mouth daily.   Xopenex HFA 45 MCG/ACT inhaler Generic drug: levalbuterol INL 1 TO 2 PFS PO Q 4 H PRN       Allergies:  Allergies  Allergen Reactions  . Albuterol Anaphylaxis  . Aripiprazole Anaphylaxis  . Azelastine Anaphylaxis  . Budesonide Anaphylaxis  . Budesonide-Formoterol Fumarate Anaphylaxis  . Ciclesonide Anaphylaxis  . Fluticasone Anaphylaxis  . Fluticasone Furoate Anaphylaxis  . Fluticasone-Salmeterol Anaphylaxis  . Gadolinium Derivatives Anaphylaxis  . Hydrocodone-Acetaminophen Anaphylaxis  . Iodine Anaphylaxis    And iodide containing products   . Ivp Dye [Iodinated Diagnostic Agents] Anaphylaxis  . Latex Anaphylaxis  . Mometasone Anaphylaxis  . Other Anaphylaxis    Petrochemical products  . Peanut-Containing Drug Products Anaphylaxis  . Penicillins Anaphylaxis  . Petrolatum Anaphylaxis    Yellow   . Petroleum Distillate Anaphylaxis and Rash    Other reaction(s): Angioedema Teaching laboratory technician) (plastics)   . Tamsulosin Anaphylaxis  . Tdap [Tetanus-Diphth-Acell Pertussis] Anaphylaxis  . Venlafaxine Other (See Comments)    Migraine, causes eye to close  . Zinc Oxide Anaphylaxis  . Albuterol Sulfate Other (See Comments)    Gi distress   . Antihistamines, Chlorpheniramine-Type Other (See Comments)    Gi distress   . Cetirizine & Related Other (See Comments)    Gi distress    . Diphenhydramine     Other reaction(s): Other (See Comments), Other (See Comments) Doesn't work-can take Benadryl Doesn't work-can take Benadryl   . Duloxetine Other (See Comments)    Gi distress   .  Fexofenadine Other (See Comments)    Gi distress   . Gluten Meal     Other reaction(s): Other (See Comments), Other (See Comments) Doesn't digest Doesn't digest   . Haloperidol And Related Other (See Comments)    Gi distress   . Ipratropium-Albuterol Other (See Comments)    Gi distress   . Ipratropium-Albuterol Other (See Comments)  . Lac Bovis     Other reaction(s): Angioedema  . Levothyroxine Other (See Comments)    Gi distress   . Levothyroxine Sodium Nausea And Vomiting  . Metformin And Related Nausea And Vomiting  . Montelukast     Other reaction(s): Mental Status Changes (intolerance), Other (See Comments)  . Montelukast Sodium Other (See Comments)    Gi distress   . Omeprazole Nausea And Vomiting  . Pineapple Other (See Comments)    Other reaction(s): Other (See Comments) unknown unknown   . Red Dye Other (See Comments)    Other reaction(s): GI Upset (intolerance), Other (See Comments) migraines migraines   . Sitagliptin Nausea And Vomiting  . Ziprasidone Hcl Other (See Comments)    Gi distress   . Adhesive [Tape] Hives and Rash  . Bupropion Anxiety    Family History: Family History  Problem Relation Age of Onset  . Stroke Paternal Aunt   . Diabetes Father   . Obesity Father   . Hyperthyroidism Mother   . Cancer Sister        skin  . Osteoporosis Maternal Grandmother   . Hyperthyroidism Maternal Grandmother   . Bipolar disorder Maternal Grandmother   . Heart attack Maternal Grandfather   . Diabetes Paternal Grandmother   . Heart attack Paternal Grandfather   . Breast cancer Paternal Aunt   . Diabetes Paternal Aunt   . Breast cancer Cousin   . Diabetes Cousin   . Bipolar disorder Cousin   . Bladder Cancer Neg Hx   . Prostate cancer Neg  Hx   . Kidney cancer Neg Hx   . Ovarian cancer Neg Hx   . Colon cancer Neg Hx   . Heart disease Neg Hx     Social History:  reports that she has never smoked. She has never used smokeless tobacco. She reports that she does not drink alcohol or use drugs.   Physical Exam: There were no vitals taken for this visit.  Constitutional:  Alert and oriented, No acute distress. HEENT: [place] AT, moist mucus membranes.  Trachea midline, no masses. Cardiovascular: No clubbing, cyanosis, or edema. Respiratory: Normal respiratory effort, no increased work of breathing. Skin: No rashes, bruises or suspicious lesions. Neurologic: Grossly intact, no focal deficits, moving all 4 extremities. Psychiatric: Normal mood and affect.  Laboratory Data:  Urinalysis UA - negative   Assessment & Plan:    1. Elevated LFTs Possibly related to Elmiron  Additional work up has been negative  2. IC (interstitial cystitis)  Recommended weaning off Elmiron 100 mg to once daily with goal of titrating off Will manage flairs with symptomatic treatment, advised to call if experiences severe frequency upon cutting dose  Return in 4 weeks for LFTs at time of visit    [facility] [address], [place], [place] [address] [phone]  I, Lucas Mallow, am acting as a scribe for Dr. Hollice Espy,  I have reviewed the above documentation for accuracy and completeness, and I agree with the above.   Hollice Espy, MD

## 2020-01-17 ENCOUNTER — Ambulatory Visit: Payer: PPO | Admitting: Urology

## 2020-01-17 ENCOUNTER — Other Ambulatory Visit: Payer: Self-pay

## 2020-01-17 ENCOUNTER — Encounter: Payer: Self-pay | Admitting: Urology

## 2020-01-17 VITALS — BP 123/82 | HR 106 | Wt 237.0 lb

## 2020-01-17 DIAGNOSIS — R7989 Other specified abnormal findings of blood chemistry: Secondary | ICD-10-CM | POA: Diagnosis not present

## 2020-01-17 DIAGNOSIS — N3010 Interstitial cystitis (chronic) without hematuria: Secondary | ICD-10-CM

## 2020-01-18 LAB — URINALYSIS, COMPLETE
Bilirubin, UA: NEGATIVE
Glucose, UA: NEGATIVE
Ketones, UA: NEGATIVE
Leukocytes,UA: NEGATIVE
Nitrite, UA: NEGATIVE
Protein,UA: NEGATIVE
Specific Gravity, UA: 1.01 (ref 1.005–1.030)
Urobilinogen, Ur: 0.2 mg/dL (ref 0.2–1.0)
pH, UA: 6 (ref 5.0–7.5)

## 2020-01-18 LAB — MICROSCOPIC EXAMINATION

## 2020-01-18 MED ORDER — ELMIRON 100 MG PO CAPS: 100.0000 mg | ORAL_CAPSULE | Freq: Every day | ORAL | 0 refills | Status: DC

## 2020-02-13 NOTE — Progress Notes (Signed)
02/14/20  10:35 AM   Stacy Daniel 1970-10-25 [id number]  Referring provider: [name], MD [place],   [id number] Chief Complaint  Patient presents with   Follow-up    HPI: Stacy Daniel is a 49 y.o. F with a personal history of interstitial cystitis returns today for 4 week f/u.   She was diagnosed w/ elevated LFTs by Dr. Allen Norris at [facility]. She had an extensive workup including a hepatitis panel and elastography.  On previous visit, her Elmiron was cut back to 100 mg once daily. She reports of doing well with this lower dose.   PMH: Past Medical History:  Diagnosis Date   Angioedema    Anxiety    Bipolar 1 disorder ([place])    Borderline personality disorder ([place])    Breast pain in female    chronic, right   Chronic bronchitis (HCC)    COPD (chronic obstructive pulmonary disease) (HCC)    Diabetes mellitus without complication (HCC)    Fibromyalgia    Hypoglycemia    IC (interstitial cystitis)    OCD (obsessive compulsive disorder)    Osteoarthritis    Osteoarthritis    PCOS (polycystic ovarian syndrome)    Post traumatic stress disorder    Rheumatoid arthritis (HCC)    Rheumatoid arthritis (HCC)    Schizoaffective disorder, bipolar type ([place])    Shingles (herpes zoster) polyneuropathy    Stroke ([place]) 02/14/16   TIA    Surgical History: Past Surgical History:  Procedure Laterality Date   ABDOMINAL HYSTERECTOMY  06/1997   with left ovarectomy   GANGLION CYST EXCISION  11/2010   right wrist   OOPHORECTOMY Right 1995   WISDOM TOOTH EXTRACTION  1988    Home Medications:  Allergies as of 02/14/2020      Reactions   Albuterol Anaphylaxis   Aripiprazole Anaphylaxis   Azelastine Anaphylaxis   Budesonide Anaphylaxis   Budesonide-formoterol Fumarate Anaphylaxis   Ciclesonide Anaphylaxis   Fluticasone Anaphylaxis   Fluticasone Furoate Anaphylaxis   Fluticasone-salmeterol Anaphylaxis    Gadolinium Derivatives Anaphylaxis   Hydrocodone-acetaminophen Anaphylaxis   Iodine Anaphylaxis   And iodide containing products   Ivp Dye [iodinated Diagnostic Agents] Anaphylaxis   Latex Anaphylaxis   Mometasone Anaphylaxis   Other Anaphylaxis   Petrochemical products   Peanut-containing Drug Products Anaphylaxis   Penicillins Anaphylaxis   Petrolatum Anaphylaxis   Yellow   Petroleum Distillate Anaphylaxis, Rash   Other reaction(s): Angioedema Teaching laboratory technician) (plastics)   Tamsulosin Anaphylaxis   Tdap [tetanus-diphth-acell Pertussis] Anaphylaxis   Venlafaxine Other (See Comments)   Migraine, causes eye to close   Zinc Oxide Anaphylaxis   Albuterol Sulfate Other (See Comments)   Gi distress   Antihistamines, Chlorpheniramine-type Other (See Comments)   Gi distress   Cetirizine & Related Other (See Comments)   Gi distress   Diphenhydramine    Other reaction(s): Other (See Comments), Other (See Comments) Doesn't work-can take Benadryl Doesn't work-can take Benadryl   Duloxetine Other (See Comments)   Gi distress   Fexofenadine Other (See Comments)   Gi distress   Gluten Meal    Other reaction(s): Other (See Comments), Other (See Comments) Doesn't digest Doesn't digest   Haloperidol And Related Other (See Comments)   Gi distress   Ipratropium-albuterol Other (See Comments)   Gi distress   Ipratropium-albuterol Other (See Comments)   Lac Bovis    Other reaction(s): Angioedema   Levothyroxine Other (See Comments)   Gi distress   Levothyroxine Sodium  Nausea And Vomiting   Metformin And Related Nausea And Vomiting   Montelukast    Other reaction(s): Mental Status Changes (intolerance), Other (See Comments)   Montelukast Sodium Other (See Comments)   Gi distress   Omeprazole Nausea And Vomiting   Pineapple Other (See Comments)   Other reaction(s): Other (See Comments) unknown unknown   Red Dye Other (See Comments)   Other reaction(s): GI Upset (intolerance), Other  (See Comments) migraines migraines   Sitagliptin Nausea And Vomiting   Ziprasidone Hcl Other (See Comments)   Gi distress   Adhesive [tape] Hives, Rash   Bupropion Anxiety      Medication List       Accurate as of February 14, 2020 11:59 PM. If you have any questions, ask your nurse or doctor.        Ajovy 225 MG/1.5ML Sosy Generic drug: Fremanezumab-vfrm   albuterol 108 (90 Base) MCG/ACT inhaler Commonly known as: VENTOLIN HFA Inhale 2 puffs into the lungs every 6 (six) hours as needed for wheezing or shortness of breath.   b complex vitamins tablet Take 1 tablet by mouth daily.   BD Pen Needle Nano U/F 32G X 4 MM Misc Generic drug: Insulin Pen Needle   cholecalciferol 1000 units tablet Commonly known as: VITAMIN D Take 1,000 Units by mouth 2 (two) times daily.   diphenhydrAMINE 50 MG capsule Commonly known as: BENADRYL Take by mouth. Takes 25 mg/4 for angioedema   Elmiron 100 MG capsule Generic drug: pentosan polysulfate Take 1 capsule (100 mg total) by mouth daily.   EPINEPHrine 0.3 mg/0.3 mL Soaj injection Commonly known as: EPI-PEN Inject 0.3 mLs (0.3 mg total) into the muscle once.   escitalopram 20 MG tablet Commonly known as: LEXAPRO Take 20 mg by mouth daily.   estradiol 1 MG tablet Commonly known as: ESTRACE Take 1 tablet (1 mg total) by mouth daily.   FLAX SEED OIL PO Take by mouth.   fluconazole 150 MG tablet Commonly known as: Diflucan Take 1 tablet (150 mg total) by mouth daily.   Flunisolide HFA 80 MCG/ACT Aers Inhale 1 puff into the lungs daily.   folic acid 202 MCG tablet Commonly known as: FOLVITE Take 400 mcg by mouth daily. Reported on 02/18/2016   gabapentin 300 MG capsule Commonly known as: NEURONTIN Take 300 mg by mouth 4 (four) times daily.   Iron 142 (45 Fe) MG Tbcr Take by mouth.   ketoconazole 2 % shampoo Commonly known as: NIZORAL   levocetirizine 5 MG tablet Commonly known as: XYZAL Take 5 mg by mouth every  evening.   levothyroxine 75 MCG tablet Commonly known as: SYNTHROID Take 75 mcg by mouth at bedtime.   LORazepam 1 MG tablet Commonly known as: ATIVAN Take 1 mg by mouth 2 (two) times daily. Take morning and night.   Magnesium 250 MG Tabs Take by mouth.   montelukast 10 MG tablet Commonly known as: SINGULAIR Take 10 mg by mouth at bedtime.   niacin 500 MG tablet Take 500 mg by mouth at bedtime.   omeprazole 20 MG capsule Commonly known as: PRILOSEC Take 20 mg by mouth daily. At night.   ONE TOUCH ULTRA 2 w/Device Kit BY XX ROUTE AS DIRECTED   ONE TOUCH ULTRA TEST test strip Generic drug: glucose blood USE TO CHECK BLOOD SUGARS 2 TO 3 TIMES A DAY UTD FOR 90 DAYS   OneTouch Delica Lancets 54Y Misc Apply 1 each topically 2 (two) times daily.   Oxycodone HCl  10 MG Tabs   Probiotic 250 MG Caps Take by mouth.   Pulmicort Flexhaler 180 MCG/ACT inhaler Generic drug: budesonide   ranitidine 75 MG tablet Commonly known as: ZANTAC Take 75 mg by mouth every morning.   Relpax 40 MG tablet Generic drug: eletriptan TK 1 T PO AT ONSET MAY REPEAT Q 2 H  NOT TO EXCEED 5 TS  D   rizatriptan 10 MG tablet Commonly known as: MAXALT TK 1 T PO AOS OF HEADACHE. MAY REPEAT IN 2 H IF NO IMPROVEMENT. MAXIMUM OF 30 MG PER DAY   theophylline 300 MG 24 hr capsule Commonly known as: THEO-24 Take 300 mg by mouth daily.   thiothixene 1 MG capsule Commonly known as: NAVANE TAKE 1 CAPSULE BY MOUTH 3 TIMES A DAY AND TAKE 3 CAPSULES AT BEDTIME AS DIRECTED   tiZANidine 2 MG tablet Commonly known as: ZANAFLEX Take 2 mg by mouth 3 (three) times daily. Take half tablet every 4 hours.   topiramate 100 MG tablet Commonly known as: TOPAMAX Take 100 mg by mouth daily. Take 4 tablets at night.   valACYclovir 1000 MG tablet Commonly known as: VALTREX Take 1,000 mg by mouth 3 (three) times daily.   venlafaxine XR 150 MG 24 hr capsule Commonly known as: EFFEXOR-XR Take 150 mg by mouth at  bedtime.   Victoza 18 MG/3ML Sopn Generic drug: liraglutide   Vios Aerosol Delivery System Misc U UTD   vitamin B-12 1000 MCG tablet Commonly known as: CYANOCOBALAMIN Take by mouth.   VITAMIN C ER PO Take 120 mg by mouth.   Vraylar 4.5 MG Caps Generic drug: Cariprazine HCl Take 1 capsule by mouth daily.   Xopenex HFA 45 MCG/ACT inhaler Generic drug: levalbuterol INL 1 TO 2 PFS PO Q 4 H PRN       Allergies:  Allergies  Allergen Reactions   Albuterol Anaphylaxis   Aripiprazole Anaphylaxis   Azelastine Anaphylaxis   Budesonide Anaphylaxis   Budesonide-Formoterol Fumarate Anaphylaxis   Ciclesonide Anaphylaxis   Fluticasone Anaphylaxis   Fluticasone Furoate Anaphylaxis   Fluticasone-Salmeterol Anaphylaxis   Gadolinium Derivatives Anaphylaxis   Hydrocodone-Acetaminophen Anaphylaxis   Iodine Anaphylaxis    And iodide containing products    Ivp Dye [Iodinated Diagnostic Agents] Anaphylaxis   Latex Anaphylaxis   Mometasone Anaphylaxis   Other Anaphylaxis    Petrochemical products   Peanut-Containing Drug Products Anaphylaxis   Penicillins Anaphylaxis   Petrolatum Anaphylaxis    Yellow    Petroleum Distillate Anaphylaxis and Rash    Other reaction(s): Angioedema Teaching laboratory technician) (plastics)    Tamsulosin Anaphylaxis   Tdap [Tetanus-Diphth-Acell Pertussis] Anaphylaxis   Venlafaxine Other (See Comments)    Migraine, causes eye to close   Zinc Oxide Anaphylaxis   Albuterol Sulfate Other (See Comments)    Gi distress    Antihistamines, Chlorpheniramine-Type Other (See Comments)    Gi distress    Cetirizine & Related Other (See Comments)    Gi distress    Diphenhydramine     Other reaction(s): Other (See Comments), Other (See Comments) Doesn't work-can take Benadryl Doesn't work-can take Benadryl    Duloxetine Other (See Comments)    Gi distress    Fexofenadine Other (See Comments)    Gi distress    Gluten Meal     Other  reaction(s): Other (See Comments), Other (See Comments) Doesn't digest Doesn't digest    Haloperidol And Related Other (See Comments)    Gi distress    Ipratropium-Albuterol Other (See Comments)  Gi distress    Ipratropium-Albuterol Other (See Comments)   Lac Bovis     Other reaction(s): Angioedema   Levothyroxine Other (See Comments)    Gi distress    Levothyroxine Sodium Nausea And Vomiting   Metformin And Related Nausea And Vomiting   Montelukast     Other reaction(s): Mental Status Changes (intolerance), Other (See Comments)   Montelukast Sodium Other (See Comments)    Gi distress    Omeprazole Nausea And Vomiting   Pineapple Other (See Comments)    Other reaction(s): Other (See Comments) unknown unknown    Red Dye Other (See Comments)    Other reaction(s): GI Upset (intolerance), Other (See Comments) migraines migraines    Sitagliptin Nausea And Vomiting   Ziprasidone Hcl Other (See Comments)    Gi distress    Adhesive [Tape] Hives and Rash   Bupropion Anxiety    Family History: Family History  Problem Relation Age of Onset   Stroke Paternal Aunt    Diabetes Father    Obesity Father    Hyperthyroidism Mother    Cancer Sister        skin   Osteoporosis Maternal Grandmother    Hyperthyroidism Maternal Grandmother    Bipolar disorder Maternal Grandmother    Heart attack Maternal Grandfather    Diabetes Paternal Grandmother    Heart attack Paternal Grandfather    Breast cancer Paternal Aunt    Diabetes Paternal Aunt    Breast cancer Cousin    Diabetes Cousin    Bipolar disorder Cousin    Bladder Cancer Neg Hx    Prostate cancer Neg Hx    Kidney cancer Neg Hx    Ovarian cancer Neg Hx    Colon cancer Neg Hx    Heart disease Neg Hx     Social History:  reports that she has never smoked. She has never used smokeless tobacco. She reports that she does not drink alcohol and does not use drugs.   Physical  Exam: BP 114/83    Pulse (!) 103    Ht _0  (1.575 m)    Wt 237 lb (107.5 kg)    BMI 43.35 kg/m   Constitutional:  Alert and oriented, No acute distress. HEENT: [place] AT, moist mucus membranes.  Trachea midline, no masses. Cardiovascular: No clubbing, cyanosis, or edema. Respiratory: Normal respiratory effort, no increased work of breathing. Skin: No rashes, bruises or suspicious lesions. Neurologic: Grossly intact, no focal deficits, moving all 4 extremities. Psychiatric: Normal mood and affect.  Assessment & Plan:    1. Elevated LFTs Pending  Possibly related to Elmiron  If normalized, will continue Elmiron 100 mg once daily  Will call w/ LFT results  If still elevated, will need to d/c this medicine and recheck labs in a month  2. IC (interstitial cystitis)  As above  Stable with dose reduction  F/u 1 year  [place] [address], [place],  [address] [phone]  I, Lucas Mallow, am acting as a scribe for Dr. Hollice Espy,  I have reviewed the above documentation for accuracy and completeness, and I agree with the above.   Hollice Espy, MD

## 2020-02-14 ENCOUNTER — Ambulatory Visit: Payer: PPO | Admitting: Urology

## 2020-02-14 ENCOUNTER — Other Ambulatory Visit: Payer: Self-pay

## 2020-02-14 DIAGNOSIS — N3010 Interstitial cystitis (chronic) without hematuria: Secondary | ICD-10-CM | POA: Diagnosis not present

## 2020-02-15 ENCOUNTER — Telehealth: Payer: Self-pay | Admitting: *Deleted

## 2020-02-15 DIAGNOSIS — R7989 Other specified abnormal findings of blood chemistry: Secondary | ICD-10-CM

## 2020-02-15 LAB — HEPATIC FUNCTION PANEL
ALT: 102 IU/L — ABNORMAL HIGH (ref 0–32)
AST: 56 IU/L — ABNORMAL HIGH (ref 0–40)
Albumin: 4.4 g/dL (ref 3.8–4.8)
Alkaline Phosphatase: 115 IU/L (ref 48–121)
Bilirubin Total: 0.5 mg/dL (ref 0.0–1.2)
Bilirubin, Direct: 0.14 mg/dL (ref 0.00–0.40)
Total Protein: 7 g/dL (ref 6.0–8.5)

## 2020-02-15 NOTE — Telephone Encounter (Addendum)
Patient aware, lab appointment scheduled. Would like to know if there is a recommendation to take something else OTC besides Elmiron?   ----- Message from Hollice Espy, MD sent at 02/15/2020 11:17 AM EDT ----- LFTs are still elevated.  Please stop Elmiron altogether and have her come back for lab only in 1 month for repeat LFTs.  If elevated teas fails to go down, there may be something else going.

## 2020-02-15 NOTE — Telephone Encounter (Signed)
As long as she's not having symptoms, no.  Lets just see how she does and go from there.  Please have her mychart message if starts having symptoms.   Hollice Espy, MD

## 2020-02-16 ENCOUNTER — Encounter: Payer: Self-pay | Admitting: Family Medicine

## 2020-02-19 NOTE — Telephone Encounter (Signed)
Patient informed, voiced understanding.  °

## 2020-02-26 DIAGNOSIS — M542 Cervicalgia: Secondary | ICD-10-CM | POA: Diagnosis not present

## 2020-02-26 DIAGNOSIS — M7912 Myalgia of auxiliary muscles, head and neck: Secondary | ICD-10-CM | POA: Diagnosis not present

## 2020-02-26 DIAGNOSIS — R5383 Other fatigue: Secondary | ICD-10-CM | POA: Diagnosis not present

## 2020-02-26 DIAGNOSIS — J302 Other seasonal allergic rhinitis: Secondary | ICD-10-CM | POA: Diagnosis not present

## 2020-02-26 DIAGNOSIS — J301 Allergic rhinitis due to pollen: Secondary | ICD-10-CM | POA: Diagnosis not present

## 2020-02-26 DIAGNOSIS — J3089 Other allergic rhinitis: Secondary | ICD-10-CM | POA: Diagnosis not present

## 2020-02-26 DIAGNOSIS — J4540 Moderate persistent asthma, uncomplicated: Secondary | ICD-10-CM | POA: Diagnosis not present

## 2020-03-18 DIAGNOSIS — F4310 Post-traumatic stress disorder, unspecified: Secondary | ICD-10-CM | POA: Diagnosis not present

## 2020-03-18 DIAGNOSIS — F3181 Bipolar II disorder: Secondary | ICD-10-CM | POA: Diagnosis not present

## 2020-03-18 DIAGNOSIS — F4481 Dissociative identity disorder: Secondary | ICD-10-CM | POA: Diagnosis not present

## 2020-03-19 ENCOUNTER — Other Ambulatory Visit: Payer: PPO

## 2020-03-19 ENCOUNTER — Other Ambulatory Visit: Payer: Self-pay

## 2020-03-19 DIAGNOSIS — R7989 Other specified abnormal findings of blood chemistry: Secondary | ICD-10-CM

## 2020-03-20 LAB — HEPATIC FUNCTION PANEL
ALT: 102 IU/L — ABNORMAL HIGH (ref 0–32)
AST: 57 IU/L — ABNORMAL HIGH (ref 0–40)
Albumin: 4.4 g/dL (ref 3.8–4.8)
Alkaline Phosphatase: 121 IU/L (ref 48–121)
Bilirubin Total: 0.3 mg/dL (ref 0.0–1.2)
Bilirubin, Direct: 0.12 mg/dL (ref 0.00–0.40)
Total Protein: 7.6 g/dL (ref 6.0–8.5)

## 2020-03-22 ENCOUNTER — Telehealth: Payer: Self-pay | Admitting: *Deleted

## 2020-03-22 NOTE — Telephone Encounter (Addendum)
Patient informed, voiced understanding.   ----- Message from Hollice Espy, MD sent at 03/22/2020 11:57 AM EDT ----- LFTs are still elevated despite stopping med.  It does not seem that its this particular medicine that is causing the issue.  OK to resume if its her preference.   Recommend f/u with PCP re: LFTs.   Hollice Espy, MD

## 2020-05-02 DIAGNOSIS — K760 Fatty (change of) liver, not elsewhere classified: Secondary | ICD-10-CM | POA: Diagnosis not present

## 2020-05-02 DIAGNOSIS — F319 Bipolar disorder, unspecified: Secondary | ICD-10-CM | POA: Diagnosis not present

## 2020-05-02 DIAGNOSIS — J3089 Other allergic rhinitis: Secondary | ICD-10-CM | POA: Diagnosis not present

## 2020-05-02 DIAGNOSIS — J4530 Mild persistent asthma, uncomplicated: Secondary | ICD-10-CM | POA: Diagnosis not present

## 2020-05-02 DIAGNOSIS — G43109 Migraine with aura, not intractable, without status migrainosus: Secondary | ICD-10-CM | POA: Diagnosis not present

## 2020-05-02 DIAGNOSIS — E039 Hypothyroidism, unspecified: Secondary | ICD-10-CM | POA: Diagnosis not present

## 2020-05-02 DIAGNOSIS — K219 Gastro-esophageal reflux disease without esophagitis: Secondary | ICD-10-CM | POA: Diagnosis not present

## 2020-05-02 DIAGNOSIS — E7800 Pure hypercholesterolemia, unspecified: Secondary | ICD-10-CM | POA: Diagnosis not present

## 2020-05-02 DIAGNOSIS — E119 Type 2 diabetes mellitus without complications: Secondary | ICD-10-CM | POA: Diagnosis not present

## 2020-05-02 DIAGNOSIS — E559 Vitamin D deficiency, unspecified: Secondary | ICD-10-CM | POA: Diagnosis not present

## 2020-05-02 DIAGNOSIS — Z6841 Body Mass Index (BMI) 40.0 and over, adult: Secondary | ICD-10-CM | POA: Diagnosis not present

## 2020-05-20 DIAGNOSIS — F3181 Bipolar II disorder: Secondary | ICD-10-CM | POA: Diagnosis not present

## 2020-05-20 DIAGNOSIS — F4481 Dissociative identity disorder: Secondary | ICD-10-CM | POA: Diagnosis not present

## 2020-05-20 DIAGNOSIS — Z79899 Other long term (current) drug therapy: Secondary | ICD-10-CM | POA: Diagnosis not present

## 2020-05-20 DIAGNOSIS — F4310 Post-traumatic stress disorder, unspecified: Secondary | ICD-10-CM | POA: Diagnosis not present

## 2020-06-03 DIAGNOSIS — M797 Fibromyalgia: Secondary | ICD-10-CM | POA: Diagnosis not present

## 2020-06-03 DIAGNOSIS — M542 Cervicalgia: Secondary | ICD-10-CM | POA: Diagnosis not present

## 2020-06-03 DIAGNOSIS — Z6841 Body Mass Index (BMI) 40.0 and over, adult: Secondary | ICD-10-CM | POA: Diagnosis not present

## 2020-06-03 DIAGNOSIS — R5383 Other fatigue: Secondary | ICD-10-CM | POA: Diagnosis not present

## 2020-06-18 DIAGNOSIS — E039 Hypothyroidism, unspecified: Secondary | ICD-10-CM | POA: Diagnosis not present

## 2020-07-15 DIAGNOSIS — F3181 Bipolar II disorder: Secondary | ICD-10-CM | POA: Diagnosis not present

## 2020-07-15 DIAGNOSIS — F4310 Post-traumatic stress disorder, unspecified: Secondary | ICD-10-CM | POA: Diagnosis not present

## 2020-08-13 DIAGNOSIS — M797 Fibromyalgia: Secondary | ICD-10-CM | POA: Diagnosis not present

## 2020-08-13 DIAGNOSIS — M7912 Myalgia of auxiliary muscles, head and neck: Secondary | ICD-10-CM | POA: Diagnosis not present

## 2020-08-13 DIAGNOSIS — Z6841 Body Mass Index (BMI) 40.0 and over, adult: Secondary | ICD-10-CM | POA: Diagnosis not present

## 2020-08-13 DIAGNOSIS — R5383 Other fatigue: Secondary | ICD-10-CM | POA: Diagnosis not present

## 2020-08-23 DIAGNOSIS — E119 Type 2 diabetes mellitus without complications: Secondary | ICD-10-CM | POA: Diagnosis not present

## 2020-08-23 DIAGNOSIS — E039 Hypothyroidism, unspecified: Secondary | ICD-10-CM | POA: Diagnosis not present

## 2020-09-30 DIAGNOSIS — F4310 Post-traumatic stress disorder, unspecified: Secondary | ICD-10-CM | POA: Diagnosis not present

## 2020-09-30 DIAGNOSIS — F3181 Bipolar II disorder: Secondary | ICD-10-CM | POA: Diagnosis not present

## 2020-11-05 DIAGNOSIS — J3089 Other allergic rhinitis: Secondary | ICD-10-CM | POA: Diagnosis not present

## 2020-11-05 DIAGNOSIS — G43109 Migraine with aura, not intractable, without status migrainosus: Secondary | ICD-10-CM | POA: Diagnosis not present

## 2020-11-05 DIAGNOSIS — E559 Vitamin D deficiency, unspecified: Secondary | ICD-10-CM | POA: Diagnosis not present

## 2020-11-05 DIAGNOSIS — K219 Gastro-esophageal reflux disease without esophagitis: Secondary | ICD-10-CM | POA: Diagnosis not present

## 2020-11-05 DIAGNOSIS — J4530 Mild persistent asthma, uncomplicated: Secondary | ICD-10-CM | POA: Diagnosis not present

## 2020-11-05 DIAGNOSIS — F319 Bipolar disorder, unspecified: Secondary | ICD-10-CM | POA: Diagnosis not present

## 2020-11-05 DIAGNOSIS — E119 Type 2 diabetes mellitus without complications: Secondary | ICD-10-CM | POA: Diagnosis not present

## 2020-11-05 DIAGNOSIS — K760 Fatty (change of) liver, not elsewhere classified: Secondary | ICD-10-CM | POA: Diagnosis not present

## 2020-11-05 DIAGNOSIS — Z0000 Encounter for general adult medical examination without abnormal findings: Secondary | ICD-10-CM | POA: Diagnosis not present

## 2020-11-05 DIAGNOSIS — E7800 Pure hypercholesterolemia, unspecified: Secondary | ICD-10-CM | POA: Diagnosis not present

## 2020-11-05 DIAGNOSIS — Z6841 Body Mass Index (BMI) 40.0 and over, adult: Secondary | ICD-10-CM | POA: Diagnosis not present

## 2020-11-05 DIAGNOSIS — E039 Hypothyroidism, unspecified: Secondary | ICD-10-CM | POA: Diagnosis not present

## 2020-11-18 DIAGNOSIS — Z6841 Body Mass Index (BMI) 40.0 and over, adult: Secondary | ICD-10-CM | POA: Diagnosis not present

## 2020-11-18 DIAGNOSIS — R5383 Other fatigue: Secondary | ICD-10-CM | POA: Diagnosis not present

## 2020-11-18 DIAGNOSIS — M797 Fibromyalgia: Secondary | ICD-10-CM | POA: Diagnosis not present

## 2020-11-18 DIAGNOSIS — M7912 Myalgia of auxiliary muscles, head and neck: Secondary | ICD-10-CM | POA: Diagnosis not present

## 2020-11-25 DIAGNOSIS — G43009 Migraine without aura, not intractable, without status migrainosus: Secondary | ICD-10-CM | POA: Diagnosis not present

## 2020-11-25 DIAGNOSIS — F3181 Bipolar II disorder: Secondary | ICD-10-CM | POA: Diagnosis not present

## 2020-11-25 DIAGNOSIS — F4310 Post-traumatic stress disorder, unspecified: Secondary | ICD-10-CM | POA: Diagnosis not present

## 2020-11-25 DIAGNOSIS — R270 Ataxia, unspecified: Secondary | ICD-10-CM | POA: Diagnosis not present

## 2020-11-25 DIAGNOSIS — M6289 Other specified disorders of muscle: Secondary | ICD-10-CM | POA: Diagnosis not present

## 2020-11-25 DIAGNOSIS — F411 Generalized anxiety disorder: Secondary | ICD-10-CM | POA: Diagnosis not present

## 2020-11-25 DIAGNOSIS — G479 Sleep disorder, unspecified: Secondary | ICD-10-CM | POA: Diagnosis not present

## 2020-11-29 DIAGNOSIS — E039 Hypothyroidism, unspecified: Secondary | ICD-10-CM | POA: Diagnosis not present

## 2020-11-29 DIAGNOSIS — E119 Type 2 diabetes mellitus without complications: Secondary | ICD-10-CM | POA: Diagnosis not present

## 2020-12-06 DIAGNOSIS — E039 Hypothyroidism, unspecified: Secondary | ICD-10-CM | POA: Diagnosis not present

## 2020-12-06 DIAGNOSIS — E119 Type 2 diabetes mellitus without complications: Secondary | ICD-10-CM | POA: Diagnosis not present

## 2021-02-19 ENCOUNTER — Ambulatory Visit: Payer: Self-pay | Admitting: Urology

## 2021-03-04 DIAGNOSIS — J3089 Other allergic rhinitis: Secondary | ICD-10-CM | POA: Diagnosis not present

## 2021-03-04 DIAGNOSIS — J342 Deviated nasal septum: Secondary | ICD-10-CM | POA: Diagnosis not present

## 2021-03-04 DIAGNOSIS — J4540 Moderate persistent asthma, uncomplicated: Secondary | ICD-10-CM | POA: Diagnosis not present

## 2021-03-04 DIAGNOSIS — Z87892 Personal history of anaphylaxis: Secondary | ICD-10-CM | POA: Diagnosis not present

## 2021-03-04 DIAGNOSIS — J301 Allergic rhinitis due to pollen: Secondary | ICD-10-CM | POA: Diagnosis not present

## 2021-03-04 DIAGNOSIS — G43009 Migraine without aura, not intractable, without status migrainosus: Secondary | ICD-10-CM | POA: Diagnosis not present

## 2021-03-04 DIAGNOSIS — J302 Other seasonal allergic rhinitis: Secondary | ICD-10-CM | POA: Diagnosis not present

## 2021-03-14 DIAGNOSIS — E119 Type 2 diabetes mellitus without complications: Secondary | ICD-10-CM | POA: Diagnosis not present

## 2021-03-14 DIAGNOSIS — E039 Hypothyroidism, unspecified: Secondary | ICD-10-CM | POA: Diagnosis not present

## 2021-03-17 DIAGNOSIS — F3181 Bipolar II disorder: Secondary | ICD-10-CM | POA: Diagnosis not present

## 2021-03-17 DIAGNOSIS — F4310 Post-traumatic stress disorder, unspecified: Secondary | ICD-10-CM | POA: Diagnosis not present

## 2021-03-21 DIAGNOSIS — E119 Type 2 diabetes mellitus without complications: Secondary | ICD-10-CM | POA: Diagnosis not present

## 2021-03-21 DIAGNOSIS — E039 Hypothyroidism, unspecified: Secondary | ICD-10-CM | POA: Diagnosis not present

## 2021-03-28 DIAGNOSIS — G5712 Meralgia paresthetica, left lower limb: Secondary | ICD-10-CM | POA: Diagnosis not present

## 2021-04-08 DIAGNOSIS — R5383 Other fatigue: Secondary | ICD-10-CM | POA: Diagnosis not present

## 2021-04-08 DIAGNOSIS — M797 Fibromyalgia: Secondary | ICD-10-CM | POA: Diagnosis not present

## 2021-05-14 DIAGNOSIS — R251 Tremor, unspecified: Secondary | ICD-10-CM | POA: Diagnosis not present

## 2021-05-14 DIAGNOSIS — F411 Generalized anxiety disorder: Secondary | ICD-10-CM | POA: Diagnosis not present

## 2021-05-14 DIAGNOSIS — M6289 Other specified disorders of muscle: Secondary | ICD-10-CM | POA: Diagnosis not present

## 2021-05-14 DIAGNOSIS — G479 Sleep disorder, unspecified: Secondary | ICD-10-CM | POA: Diagnosis not present

## 2021-05-14 DIAGNOSIS — R519 Headache, unspecified: Secondary | ICD-10-CM | POA: Diagnosis not present

## 2021-05-26 DIAGNOSIS — K760 Fatty (change of) liver, not elsewhere classified: Secondary | ICD-10-CM | POA: Diagnosis not present

## 2021-05-26 DIAGNOSIS — E7800 Pure hypercholesterolemia, unspecified: Secondary | ICD-10-CM | POA: Diagnosis not present

## 2021-05-26 DIAGNOSIS — J4530 Mild persistent asthma, uncomplicated: Secondary | ICD-10-CM | POA: Diagnosis not present

## 2021-05-26 DIAGNOSIS — F319 Bipolar disorder, unspecified: Secondary | ICD-10-CM | POA: Diagnosis not present

## 2021-05-26 DIAGNOSIS — K219 Gastro-esophageal reflux disease without esophagitis: Secondary | ICD-10-CM | POA: Diagnosis not present

## 2021-05-26 DIAGNOSIS — Z6841 Body Mass Index (BMI) 40.0 and over, adult: Secondary | ICD-10-CM | POA: Diagnosis not present

## 2021-05-26 DIAGNOSIS — E119 Type 2 diabetes mellitus without complications: Secondary | ICD-10-CM | POA: Diagnosis not present

## 2021-05-26 DIAGNOSIS — J3089 Other allergic rhinitis: Secondary | ICD-10-CM | POA: Diagnosis not present

## 2021-05-26 DIAGNOSIS — E559 Vitamin D deficiency, unspecified: Secondary | ICD-10-CM | POA: Diagnosis not present

## 2021-05-26 DIAGNOSIS — G43109 Migraine with aura, not intractable, without status migrainosus: Secondary | ICD-10-CM | POA: Diagnosis not present

## 2021-05-26 DIAGNOSIS — E039 Hypothyroidism, unspecified: Secondary | ICD-10-CM | POA: Diagnosis not present

## 2021-05-28 DIAGNOSIS — E7800 Pure hypercholesterolemia, unspecified: Secondary | ICD-10-CM | POA: Diagnosis not present

## 2021-06-09 DIAGNOSIS — F3181 Bipolar II disorder: Secondary | ICD-10-CM | POA: Diagnosis not present

## 2021-06-09 DIAGNOSIS — F4310 Post-traumatic stress disorder, unspecified: Secondary | ICD-10-CM | POA: Diagnosis not present

## 2021-07-17 DIAGNOSIS — M542 Cervicalgia: Secondary | ICD-10-CM | POA: Diagnosis not present

## 2021-07-17 DIAGNOSIS — M797 Fibromyalgia: Secondary | ICD-10-CM | POA: Diagnosis not present

## 2021-07-17 DIAGNOSIS — Z6841 Body Mass Index (BMI) 40.0 and over, adult: Secondary | ICD-10-CM | POA: Diagnosis not present

## 2021-07-17 DIAGNOSIS — R5383 Other fatigue: Secondary | ICD-10-CM | POA: Diagnosis not present

## 2021-08-11 DIAGNOSIS — Z800 Family history of malignant neoplasm of digestive organs: Secondary | ICD-10-CM | POA: Diagnosis not present

## 2021-08-11 DIAGNOSIS — R748 Abnormal levels of other serum enzymes: Secondary | ICD-10-CM | POA: Diagnosis not present

## 2021-08-12 ENCOUNTER — Other Ambulatory Visit: Payer: Self-pay | Admitting: Gastroenterology

## 2021-08-12 DIAGNOSIS — R748 Abnormal levels of other serum enzymes: Secondary | ICD-10-CM

## 2021-08-20 ENCOUNTER — Other Ambulatory Visit: Payer: Self-pay

## 2021-08-20 ENCOUNTER — Encounter: Payer: PPO | Attending: Gastroenterology | Admitting: Dietician

## 2021-08-20 VITALS — Ht 64.0 in | Wt 231.4 lb

## 2021-08-20 DIAGNOSIS — Z6839 Body mass index (BMI) 39.0-39.9, adult: Secondary | ICD-10-CM | POA: Insufficient documentation

## 2021-08-20 DIAGNOSIS — R748 Abnormal levels of other serum enzymes: Secondary | ICD-10-CM | POA: Insufficient documentation

## 2021-08-20 DIAGNOSIS — E119 Type 2 diabetes mellitus without complications: Secondary | ICD-10-CM | POA: Insufficient documentation

## 2021-08-20 DIAGNOSIS — E669 Obesity, unspecified: Secondary | ICD-10-CM | POA: Insufficient documentation

## 2021-08-20 NOTE — Patient Instructions (Addendum)
Plan for a snack that contains protein and small portion of fruit or whole grain starch at lunch time and again in evening.  Continue including some walking or other physical activity as often as able.

## 2021-08-20 NOTE — Progress Notes (Signed)
Medical Nutrition Therapy: Visit start time: 2244  end time: 1445  Assessment:  Diagnosis: Type 2 diabetes, elevated liver enzymes, obesity Past medical history: HLD, hypoglycemia, fibromyalgia Psychosocial issues/ stress concerns: bipolar disorder  Preferred learning method:  Auditory -- discussion Hands-on   Current weight: 231.4lbs Height: 5'4" BMI: 39.72 Medications, supplements: reconciled list in medical record. Patient reports she has stopped taking all supplements  Progress and evaluation:  Patient reports having pre-diabetes for several years which has progressed to Type 2 diabetes.  She also reports about 20 year history of elevated liver enzymes and NAFLD.  Most recent HbA1C was 5.5% (03/14/21); most recent hepatic panel indicated protein 7.2, albumin 4.3, total bilirubin 0.3 -- conjugated 0.08, alk phosphatase 84, AST 67, ALT 112 (08/11/21) No recent diet changes  Has milk allergy; record states gluten allergy/ intolerance but patient unsure. Also allergic to pineapple, black pepper  Physical activity: walking 30 minutes 2x a week, tries to stay active most days  Dietary Intake:  Usual eating pattern includes 2 meals and 3-4 snacks per day. Dining out frequency: 0 meals per week.  Breakfast: frosted mini wheats (dry), premier protein drink, banana Snack: none Lunch: not hungry; usually eats banana Snack: occasionally 2-3 cookies, butterscotch candy Supper: 3pm 3 servings veg + start ie potato + protein Snacks: throughout afternoon/ evening -- banana, 3 cookies, 4 dried apricots, occ premier protein Beverages: water 1 gal daily per patient, protein shakes  Nutrition Care Education: Topics covered:  Basic nutrition: basic food groups, appropriate nutrient balance, discussed Mediterranean eating pattern for overall health value. Weight control: importance of low sugar and low fat choices, portion control strategies, estimated energy needs for weight loss at 1300 kcal,  provided guidance for 45% CHO, 25% pro; role of exercise Diabetes:  appropriate meal and snack schedule, appropriate carb intake and balance, healthy carb choices, role of fiber, protein, fat Liver health:  limiting refined/ processed starches and added sugars, limiting fats and saturated fat in particular, weight control, physical activity  Nutritional Diagnosis:  [place]-2.2 Altered nutrition-related laboratory As related to Type 2 diabetes, elevated liver enzymes.  As evidenced by HbA1C controlled with diabetes medications; elevated AST and ALT. [place]-3.3 Overweight/obesity As related to limited physical activity, excess calories.  As evidenced by patient with current BMI of 39.  Intervention:  Instruction and discussion as noted above. Patient voices readiness to work on diet and lifestyle improvements. Established goals for change with direction from patient.  Education Materials given:  Museum/gallery conservator with food lists, sample meal pattern Get Healthy with Mediterranean Style Eating Visit summary with goals/ instructions   Learner/ who was taught:  Patient   Level of understanding: Verbalizes/ demonstrates competency   Demonstrated degree of understanding via:   Teach back Learning barriers: None   Willingness to learn/ readiness for change: Acceptance, ready for change   Monitoring and Evaluation:  Dietary intake, exercise, BG control, liver function, and body weight      follow up:  10/29/21 at 1:30pm

## 2021-08-21 ENCOUNTER — Encounter: Payer: Self-pay | Admitting: Dietician

## 2021-08-21 ENCOUNTER — Ambulatory Visit
Admission: RE | Admit: 2021-08-21 | Discharge: 2021-08-21 | Disposition: A | Discharge status: Home / Self Care | Payer: PPO | Source: Ambulatory Visit | Attending: Gastroenterology | Admitting: Gastroenterology

## 2021-08-21 DIAGNOSIS — R748 Abnormal levels of other serum enzymes: Secondary | ICD-10-CM

## 2021-08-21 DIAGNOSIS — K7689 Other specified diseases of liver: Secondary | ICD-10-CM | POA: Diagnosis not present

## 2021-08-21 DIAGNOSIS — N2889 Other specified disorders of kidney and ureter: Secondary | ICD-10-CM | POA: Diagnosis not present

## 2021-09-03 ENCOUNTER — Other Ambulatory Visit: Payer: Self-pay | Admitting: Gastroenterology

## 2021-09-03 DIAGNOSIS — Z79899 Other long term (current) drug therapy: Secondary | ICD-10-CM | POA: Diagnosis not present

## 2021-09-03 DIAGNOSIS — F3181 Bipolar II disorder: Secondary | ICD-10-CM | POA: Diagnosis not present

## 2021-09-03 DIAGNOSIS — N2889 Other specified disorders of kidney and ureter: Secondary | ICD-10-CM

## 2021-09-08 NOTE — Progress Notes (Signed)
09/08/21 5:58 PM   Stacy Daniel 05/06/1971 [id number]  Referring provider:  Sofie Daniel, [place],  [place] [address] No chief complaint on file.    HPI: Stacy Daniel is a 51 y.o.female with a personal history of interstitial cystitis, who presents today for further evaluation of cystic lesion on kidney.   She was diagnosed w/ elevated LFTs by Dr. Allen Norris at [facility]. She had an extensive workup including a hepatitis panel and elastography.   She underwent a ultrasound abdomen on 08/21/2021 that revealed complex multiloculated cystic lesion of the LEFT kidney which measures 6.6 cm in greatest size, significantly increased since 2011.   She has an MRI ordered by her gastroenterologist for next week for follow-up of this lesion.  She occasionally has some back pain but has a "bad back".  She reports today that her bladder is doing well today. She reports that she has a high water intake which is the reasoning for having to void more frequently. She reports that she has felt heaviness in her back but it is not associated with any pain.   She is no longer taking Elmiron.   PMH: Past Medical History:  Diagnosis Date   Angioedema    Anxiety    Bipolar 1 disorder ([place])    Borderline personality disorder ([place])    Breast pain in female    chronic, right   Chronic bronchitis (HCC)    COPD (chronic obstructive pulmonary disease) (HCC)    Diabetes mellitus without complication (HCC)    Fibromyalgia    Hypoglycemia    IC (interstitial cystitis)    OCD (obsessive compulsive disorder)    Osteoarthritis    Osteoarthritis    PCOS (polycystic ovarian syndrome)    Post traumatic stress disorder    Rheumatoid arthritis (HCC)    Rheumatoid arthritis (HCC)    Schizoaffective disorder, bipolar type ([place])    Shingles (herpes zoster) polyneuropathy    Stroke ([place]) 02/14/16   TIA    Surgical History: Past Surgical History:  Procedure Laterality  Date   ABDOMINAL HYSTERECTOMY  06/1997   with left ovarectomy   GANGLION CYST EXCISION  11/2010   right wrist   OOPHORECTOMY Right 1995   WISDOM TOOTH EXTRACTION  1988    Home Medications:  Allergies as of 09/09/2021       Reactions   Albuterol Anaphylaxis   Aripiprazole Anaphylaxis   Azelastine Anaphylaxis   Budesonide Anaphylaxis   Budesonide-formoterol Fumarate Anaphylaxis   Ciclesonide Anaphylaxis   Fluticasone Anaphylaxis   Fluticasone Furoate Anaphylaxis   Fluticasone-salmeterol Anaphylaxis   Gadolinium Derivatives Anaphylaxis   Hydrocodone-acetaminophen Anaphylaxis   Iodine Anaphylaxis   And iodide containing products   Ivp Dye [iodinated Contrast Media] Anaphylaxis   Latex Anaphylaxis   Mometasone Anaphylaxis   Other Anaphylaxis   Petrochemical products   Peanut-containing Drug Products Anaphylaxis   Penicillins Anaphylaxis   Petrolatum Anaphylaxis   Yellow   Petroleum Distillate Anaphylaxis, Rash   Other reaction(s): Angioedema Teaching laboratory technician) (plastics)   Tamsulosin Anaphylaxis   Tdap [tetanus-diphth-acell Pertussis] Anaphylaxis   Venlafaxine Other (See Comments)   Migraine, causes eye to close   Zinc Oxide Anaphylaxis   Albuterol Sulfate Other (See Comments)   Gi distress   Antihistamines, Chlorpheniramine-type Other (See Comments)   Gi distress   Cetirizine & Related Other (See Comments)   Gi distress   Diphenhydramine    Other reaction(s): Other (See Comments), Other (See Comments) Doesn't work-can take Benadryl Doesn't  work-can take Benadryl   Duloxetine Other (See Comments)   Gi distress   Fexofenadine Other (See Comments)   Gi distress   Gluten Meal    Other reaction(s): Other (See Comments), Other (See Comments) Doesn't digest Doesn't digest   Haloperidol And Related Other (See Comments)   Gi distress   Ipratropium-albuterol Other (See Comments)   Gi distress   Ipratropium-albuterol Other (See Comments)   Lac Bovis    Other reaction(s):  Angioedema   Levothyroxine Other (See Comments)   Gi distress   Levothyroxine Sodium Nausea And Vomiting   Metformin And Related Nausea And Vomiting   Montelukast    Other reaction(s): Mental Status Changes (intolerance), Other (See Comments)   Montelukast Sodium Other (See Comments)   Gi distress   Omeprazole Nausea And Vomiting   Pineapple Other (See Comments)   Other reaction(s): Other (See Comments) unknown unknown   Red Dye Other (See Comments)   Other reaction(s): GI Upset (intolerance), Other (See Comments) migraines migraines   Sitagliptin Nausea And Vomiting   Ziprasidone Hcl Other (See Comments)   Gi distress   Adhesive [tape] Hives, Rash   Bupropion Anxiety        Medication List        Accurate as of September 08, 2021  5:58 PM. If you have any questions, ask your nurse or doctor.          Ajovy 225 MG/1.5ML Sosy Generic drug: Fremanezumab-vfrm   albuterol 108 (90 Base) MCG/ACT inhaler Commonly known as: VENTOLIN HFA ProAir HFA   b complex vitamins tablet Take 1 tablet by mouth daily.   BD Pen Needle Nano U/F 32G X 4 MM Misc Generic drug: Insulin Pen Needle   cholecalciferol 1000 units tablet Commonly known as: VITAMIN D Take 1,000 Units by mouth 2 (two) times daily.   diphenhydrAMINE 25 mg capsule Commonly known as: BENADRYL Benadryl   EPINEPHrine 0.3 mg/0.3 mL Soaj injection Commonly known as: EPI-PEN EpiPen 2-Pak   estradiol 0.025 MG/24HR Commonly known as: VIVELLE-DOT Estradiol   estradiol 1 MG tablet Commonly known as: ESTRACE Take 1 tablet (1 mg total) by mouth daily.   FLAX SEED OIL PO Take by mouth.   fluconazole 150 MG tablet Commonly known as: Diflucan Take 1 tablet (150 mg total) by mouth daily.   Flunisolide HFA 80 MCG/ACT Aers Inhale 1 puff into the lungs daily.   folic acid 161 MCG tablet Commonly known as: FOLVITE Take 400 mcg by mouth daily. Reported on 02/18/2016   gabapentin 300 MG capsule Commonly known  as: NEURONTIN Take 300 mg by mouth 4 (four) times daily.   glipiZIDE 2.5 MG 24 hr tablet Commonly known as: GLUCOTROL XL Take by mouth.   ibuprofen 200 MG tablet Commonly known as: ADVIL Advil   Iron 142 (45 Fe) MG Tbcr Take by mouth.   ketoconazole 2 % shampoo Commonly known as: NIZORAL   levalbuterol 45 MCG/ACT inhaler Commonly known as: XOPENEX HFA Xopenex HFA   levocetirizine 5 MG tablet Commonly known as: XYZAL Take 5 mg by mouth every evening.   levothyroxine 75 MCG tablet Commonly known as: SYNTHROID Take 75 mcg by mouth at bedtime.   LORazepam 1 MG tablet Commonly known as: ATIVAN Take 1 mg by mouth 2 (two) times daily. Take morning and night.   Magnesium 250 MG Tabs Take by mouth.   metFORMIN 500 MG tablet Commonly known as: GLUCOPHAGE Take 1,000 mg by mouth 2 (two) times daily.   montelukast 10  MG tablet Commonly known as: SINGULAIR Take 10 mg by mouth at bedtime.   MULTIVITAMIN ADULT EXTRA C PO Multivitamin   niacin 500 MG tablet Take 500 mg by mouth at bedtime.   omeprazole 20 MG capsule Commonly known as: PRILOSEC Take 20 mg by mouth daily. At night.   ONE TOUCH ULTRA 2 w/Device Kit BY XX ROUTE AS DIRECTED   ONE TOUCH ULTRA TEST test strip Generic drug: glucose blood USE TO CHECK BLOOD SUGARS 2 TO 3 TIMES A DAY UTD FOR 90 DAYS   OneTouch Delica Lancets 46F Misc Apply 1 each topically 2 (two) times daily.   Probiotic 250 MG Caps Take by mouth.   Pulmicort Flexhaler 180 MCG/ACT inhaler Generic drug: budesonide   Relpax 40 MG tablet Generic drug: eletriptan TK 1 T PO AT ONSET MAY REPEAT Q 2 H  NOT TO EXCEED 5 TS  D   rizatriptan 10 MG tablet Commonly known as: MAXALT TK 1 T PO AOS OF HEADACHE. MAY REPEAT IN 2 H IF NO IMPROVEMENT. MAXIMUM OF 30 MG PER DAY   theophylline 300 MG 24 hr capsule Commonly known as: THEO-24 Take 300 mg by mouth daily.   thiothixene 1 MG capsule Commonly known as: NAVANE TAKE 1 CAPSULE BY MOUTH 3  TIMES A DAY AND TAKE 3 CAPSULES AT BEDTIME AS DIRECTED   tiZANidine 2 MG tablet Commonly known as: ZANAFLEX Take 2 mg by mouth 3 (three) times daily. Take half tablet every 4 hours.   topiramate 100 MG tablet Commonly known as: TOPAMAX Take 100 mg by mouth daily. Take 4 tablets at night.   valACYclovir 1000 MG tablet Commonly known as: VALTREX Take 1,000 mg by mouth 3 (three) times daily.   venlafaxine XR 150 MG 24 hr capsule Commonly known as: EFFEXOR-XR Effexor XR   Victoza 18 MG/3ML Sopn Generic drug: liraglutide   Vios Aerosol Delivery System Misc U UTD   vitamin B-12 1000 MCG tablet Commonly known as: CYANOCOBALAMIN Take by mouth.   VITAMIN C ER PO Take 120 mg by mouth.   Vraylar 4.5 MG Caps Generic drug: Cariprazine HCl Take 1 capsule by mouth daily.   zafirlukast 10 MG tablet Commonly known as: ACCOLATE zafirlukast        Allergies:  Allergies  Allergen Reactions   Albuterol Anaphylaxis   Aripiprazole Anaphylaxis   Azelastine Anaphylaxis   Budesonide Anaphylaxis   Budesonide-Formoterol Fumarate Anaphylaxis   Ciclesonide Anaphylaxis   Fluticasone Anaphylaxis   Fluticasone Furoate Anaphylaxis   Fluticasone-Salmeterol Anaphylaxis   Gadolinium Derivatives Anaphylaxis   Hydrocodone-Acetaminophen Anaphylaxis   Iodine Anaphylaxis    And iodide containing products    Ivp Dye [Iodinated Contrast Media] Anaphylaxis   Latex Anaphylaxis   Mometasone Anaphylaxis   Other Anaphylaxis    Petrochemical products   Peanut-Containing Drug Products Anaphylaxis   Penicillins Anaphylaxis   Petrolatum Anaphylaxis    Yellow    Petroleum Distillate Anaphylaxis and Rash    Other reaction(s): Angioedema Teaching laboratory technician) (plastics)    Tamsulosin Anaphylaxis   Tdap [Tetanus-Diphth-Acell Pertussis] Anaphylaxis   Venlafaxine Other (See Comments)    Migraine, causes eye to close   Zinc Oxide Anaphylaxis   Albuterol Sulfate Other (See Comments)    Gi distress     Antihistamines, Chlorpheniramine-Type Other (See Comments)    Gi distress    Cetirizine & Related Other (See Comments)    Gi distress    Diphenhydramine     Other reaction(s): Other (See Comments), Other (See Comments) Doesn't work-can  take Benadryl Doesn't work-can take Benadryl    Duloxetine Other (See Comments)    Gi distress    Fexofenadine Other (See Comments)    Gi distress    Gluten Meal     Other reaction(s): Other (See Comments), Other (See Comments) Doesn't digest Doesn't digest    Haloperidol And Related Other (See Comments)    Gi distress    Ipratropium-Albuterol Other (See Comments)    Gi distress    Ipratropium-Albuterol Other (See Comments)   Lac Bovis     Other reaction(s): Angioedema   Levothyroxine Other (See Comments)    Gi distress    Levothyroxine Sodium Nausea And Vomiting   Metformin And Related Nausea And Vomiting   Montelukast     Other reaction(s): Mental Status Changes (intolerance), Other (See Comments)   Montelukast Sodium Other (See Comments)    Gi distress    Omeprazole Nausea And Vomiting   Pineapple Other (See Comments)    Other reaction(s): Other (See Comments) unknown unknown    Red Dye Other (See Comments)    Other reaction(s): GI Upset (intolerance), Other (See Comments) migraines migraines    Sitagliptin Nausea And Vomiting   Ziprasidone Hcl Other (See Comments)    Gi distress    Adhesive [Tape] Hives and Rash   Bupropion Anxiety    Family History: Family History  Problem Relation Age of Onset   Stroke Paternal Aunt    Diabetes Father    Obesity Father    Hyperthyroidism Mother    Cancer Sister        skin   Osteoporosis Maternal Grandmother    Hyperthyroidism Maternal Grandmother    Bipolar disorder Maternal Grandmother    Heart attack Maternal Grandfather    Diabetes Paternal Grandmother    Heart attack Paternal Grandfather    Breast cancer Paternal Aunt    Diabetes Paternal Aunt    Breast cancer  Cousin    Diabetes Cousin    Bipolar disorder Cousin    Bladder Cancer Neg Hx    Prostate cancer Neg Hx    Kidney cancer Neg Hx    Ovarian cancer Neg Hx    Colon cancer Neg Hx    Heart disease Neg Hx     Social History:  reports that she has never smoked. She has never used smokeless tobacco. She reports that she does not drink alcohol and does not use drugs.   Physical Exam: There were no vitals taken for this visit.  Constitutional:  Alert and oriented, No acute distress. HEENT: [place] AT, moist mucus membranes.  Trachea midline, no masses. Cardiovascular: No clubbing, cyanosis, or edema. Respiratory: Normal respiratory effort, no increased work of breathing. Skin: No rashes, bruises or suspicious lesions. Neurologic: Grossly intact, no focal deficits, moving all 4 extremities. Psychiatric: Normal mood and affect.  Laboratory Data:  Lab Results  Component Value Date   CREATININE 0.57 02/14/2016   Urinalysis Results for orders placed or performed in visit on 09/09/21  Microscopic Examination   Urine  Result Value Ref Range   WBC, UA 0-5 0 - 5 /hpf   RBC 0-2 0 - 2 /hpf   Epithelial Cells (non renal) 0-10 0 - 10 /hpf   Bacteria, UA Many (A) None seen/Few  Urinalysis, Complete  Result Value Ref Range   Specific Gravity, UA <1.005 (L) 1.005 - 1.030   pH, UA 6.5 5.0 - 7.5   Color, UA Straw Yellow   Appearance Ur Clear Clear   Leukocytes,UA Negative  Negative   Protein,UA Negative Negative/Trace   Glucose, UA Negative Negative   Ketones, UA Negative Negative   RBC, UA Trace (A) Negative   Bilirubin, UA Negative Negative   Urobilinogen, Ur 0.2 0.2 - 1.0 mg/dL   Nitrite, UA Negative Negative   Microscopic Examination See below:      Pertinent Imaging: MRI pending  Assessment & Plan:    Renal cyst  - Will further evaluate this when MRI results are in, set reminder to review this study and depending on the complexity of the lesion, will arrange for continued  surveillance or no further surveillance.  We discussed the Bosniak classification system and all questions were answered.  2. IC (interstitial cystitis)  - Stable  - Not currently on any medications   Follow-up as needed - will call once MRI is reviewed  I,Kailey Littlejohn,acting as a scribe for Hollice Espy, MD.,have documented all relevant documentation on the behalf of Hollice Espy, MD,as directed by  Hollice Espy, MD while in the presence of Hollice Espy, MD.  I have reviewed the above documentation for accuracy and completeness, and I agree with the above.   Hollice Espy, MD   [facility] [address], [place], [place] [address] [phone]

## 2021-09-09 ENCOUNTER — Ambulatory Visit: Payer: PPO | Admitting: Urology

## 2021-09-09 ENCOUNTER — Other Ambulatory Visit: Payer: Self-pay

## 2021-09-09 VITALS — BP 137/91 | HR 111 | Ht 63.0 in | Wt 233.0 lb

## 2021-09-09 DIAGNOSIS — N281 Cyst of kidney, acquired: Secondary | ICD-10-CM

## 2021-09-09 DIAGNOSIS — N3010 Interstitial cystitis (chronic) without hematuria: Secondary | ICD-10-CM

## 2021-09-10 LAB — URINALYSIS, COMPLETE
Bilirubin, UA: NEGATIVE
Glucose, UA: NEGATIVE
Ketones, UA: NEGATIVE
Leukocytes,UA: NEGATIVE
Nitrite, UA: NEGATIVE
Protein,UA: NEGATIVE
Specific Gravity, UA: <1.005 — ABNORMAL LOW (ref 1.005–1.030)
Urobilinogen, Ur: 0.2 mg/dL (ref 0.2–1.0)
pH, UA: 6.5 (ref 5.0–7.5)

## 2021-09-10 LAB — MICROSCOPIC EXAMINATION

## 2021-09-15 DIAGNOSIS — E039 Hypothyroidism, unspecified: Secondary | ICD-10-CM | POA: Diagnosis not present

## 2021-09-15 DIAGNOSIS — E119 Type 2 diabetes mellitus without complications: Secondary | ICD-10-CM | POA: Diagnosis not present

## 2021-09-16 ENCOUNTER — Ambulatory Visit: Payer: PPO

## 2021-10-01 ENCOUNTER — Ambulatory Visit: Payer: PPO | Admitting: Dietician

## 2021-10-02 ENCOUNTER — Encounter: Payer: Self-pay | Admitting: Urology

## 2021-10-02 ENCOUNTER — Ambulatory Visit
Admission: RE | Admit: 2021-10-02 | Discharge: 2021-10-02 | Disposition: A | Discharge status: Home / Self Care | Payer: PPO | Source: Ambulatory Visit | Attending: Gastroenterology | Admitting: Gastroenterology

## 2021-10-02 ENCOUNTER — Other Ambulatory Visit: Payer: Self-pay

## 2021-10-02 DIAGNOSIS — N281 Cyst of kidney, acquired: Secondary | ICD-10-CM | POA: Diagnosis not present

## 2021-10-02 DIAGNOSIS — K8020 Calculus of gallbladder without cholecystitis without obstruction: Secondary | ICD-10-CM | POA: Diagnosis not present

## 2021-10-02 DIAGNOSIS — N2881 Hypertrophy of kidney: Secondary | ICD-10-CM | POA: Diagnosis not present

## 2021-10-02 DIAGNOSIS — N2889 Other specified disorders of kidney and ureter: Secondary | ICD-10-CM | POA: Insufficient documentation

## 2021-10-02 DIAGNOSIS — D3500 Benign neoplasm of unspecified adrenal gland: Secondary | ICD-10-CM | POA: Diagnosis not present

## 2021-10-02 MED ORDER — GADOXETATE DISODIUM 0.25 MMOL/ML IV SOLN
9.0000 mL | Freq: Once | INTRAVENOUS | Status: AC | PRN
  Administered 2021-10-02: 9 mL via INTRAVENOUS

## 2021-10-14 DIAGNOSIS — M869 Osteomyelitis, unspecified: Secondary | ICD-10-CM

## 2021-10-14 DIAGNOSIS — E1169 Type 2 diabetes mellitus with other specified complication: Secondary | ICD-10-CM

## 2021-10-14 LAB — GLUCOSE, POCT (MANUAL RESULT ENTRY): POC Glucose: 122 mg/dl — AB (ref 70–99)

## 2021-10-14 NOTE — [facility] Nurse Program (Unsigned)
°  Dept: [facility] Nurse Program Note  Date of Encounter: 10/14/2021 Patient has Diabetes and recently had her medication changed to ozempic. Wanted to have her blood sugar checked. FSBS is 122 non fasting.   Past Medical History: Past Medical History:  Diagnosis Date   Angioedema    Anxiety    Bipolar 1 disorder ([place])    Borderline personality disorder ([place])    Breast pain in female    chronic, right   Chronic bronchitis (HCC)    COPD (chronic obstructive pulmonary disease) (HCC)    Diabetes mellitus without complication (HCC)    Fibromyalgia    Hypoglycemia    IC (interstitial cystitis)    OCD (obsessive compulsive disorder)    Osteoarthritis    Osteoarthritis    PCOS (polycystic ovarian syndrome)    Post traumatic stress disorder    Rheumatoid arthritis (HCC)    Rheumatoid arthritis (HCC)    Schizoaffective disorder, bipolar type ([place])    Shingles (herpes zoster) polyneuropathy    Stroke ([place]) 02/14/16   TIA    Encounter Details:  CNP Questionnaire - 10/14/21 1103       Questionnaire   Do you give verbal consent to treat you today? Yes    Location Patient Served  Not Applicable    Visit Setting Church or Organization    Patient Status Unknown   lives in own home   Insurance Private or [place]   health team advantage   Insurance Referral N/A    Medication N/A    Medical Provider Yes   Dr. Ellison Hughs   Screening Referrals N/A    Medical Referral [place] [facility]    Transportation N/A    Housing/Utilities N/A    Interpersonal Safety N/A    Intervention Blood glucose    ED Visit Averted N/A    Life-Saving Intervention Made N/A

## 2021-10-17 DIAGNOSIS — R5383 Other fatigue: Secondary | ICD-10-CM | POA: Diagnosis not present

## 2021-10-17 DIAGNOSIS — M25561 Pain in right knee: Secondary | ICD-10-CM | POA: Diagnosis not present

## 2021-10-17 DIAGNOSIS — Z6838 Body mass index (BMI) 38.0-38.9, adult: Secondary | ICD-10-CM | POA: Diagnosis not present

## 2021-10-17 DIAGNOSIS — M859 Disorder of bone density and structure, unspecified: Secondary | ICD-10-CM | POA: Diagnosis not present

## 2021-10-17 DIAGNOSIS — M542 Cervicalgia: Secondary | ICD-10-CM | POA: Diagnosis not present

## 2021-10-17 DIAGNOSIS — M797 Fibromyalgia: Secondary | ICD-10-CM | POA: Diagnosis not present

## 2021-10-21 ENCOUNTER — Encounter
Admission: RE | Disposition: A | Discharge status: Home / Self Care | Payer: Self-pay | Source: Home / Self Care | Attending: Gastroenterology

## 2021-10-21 ENCOUNTER — Ambulatory Visit: Payer: PPO

## 2021-10-21 ENCOUNTER — Ambulatory Visit
Admission: RE | Admit: 2021-10-21 | Discharge: 2021-10-21 | Disposition: A | Discharge status: Home / Self Care | Payer: PPO | Attending: Gastroenterology | Admitting: Gastroenterology

## 2021-10-21 ENCOUNTER — Other Ambulatory Visit: Payer: Self-pay

## 2021-10-21 ENCOUNTER — Encounter: Payer: Self-pay | Admitting: *Deleted

## 2021-10-21 DIAGNOSIS — F250 Schizoaffective disorder, bipolar type: Secondary | ICD-10-CM | POA: Diagnosis not present

## 2021-10-21 DIAGNOSIS — E119 Type 2 diabetes mellitus without complications: Secondary | ICD-10-CM | POA: Diagnosis not present

## 2021-10-21 DIAGNOSIS — M797 Fibromyalgia: Secondary | ICD-10-CM | POA: Diagnosis not present

## 2021-10-21 DIAGNOSIS — Z800 Family history of malignant neoplasm of digestive organs: Secondary | ICD-10-CM | POA: Insufficient documentation

## 2021-10-21 DIAGNOSIS — K640 First degree hemorrhoids: Secondary | ICD-10-CM | POA: Diagnosis not present

## 2021-10-21 DIAGNOSIS — J449 Chronic obstructive pulmonary disease, unspecified: Secondary | ICD-10-CM | POA: Insufficient documentation

## 2021-10-21 DIAGNOSIS — K760 Fatty (change of) liver, not elsewhere classified: Secondary | ICD-10-CM | POA: Insufficient documentation

## 2021-10-21 DIAGNOSIS — E669 Obesity, unspecified: Secondary | ICD-10-CM | POA: Insufficient documentation

## 2021-10-21 DIAGNOSIS — F603 Borderline personality disorder: Secondary | ICD-10-CM | POA: Diagnosis not present

## 2021-10-21 DIAGNOSIS — Z7984 Long term (current) use of oral hypoglycemic drugs: Secondary | ICD-10-CM | POA: Diagnosis not present

## 2021-10-21 DIAGNOSIS — E039 Hypothyroidism, unspecified: Secondary | ICD-10-CM | POA: Insufficient documentation

## 2021-10-21 DIAGNOSIS — Z1211 Encounter for screening for malignant neoplasm of colon: Secondary | ICD-10-CM | POA: Insufficient documentation

## 2021-10-21 DIAGNOSIS — K219 Gastro-esophageal reflux disease without esophagitis: Secondary | ICD-10-CM | POA: Insufficient documentation

## 2021-10-21 DIAGNOSIS — F419 Anxiety disorder, unspecified: Secondary | ICD-10-CM | POA: Diagnosis not present

## 2021-10-21 DIAGNOSIS — M069 Rheumatoid arthritis, unspecified: Secondary | ICD-10-CM | POA: Insufficient documentation

## 2021-10-21 HISTORY — PX: COLONOSCOPY: SHX5424

## 2021-10-21 LAB — GLUCOSE, CAPILLARY: Glucose-Capillary: 105 mg/dL — ABNORMAL HIGH (ref 70–99)

## 2021-10-21 SURGERY — COLONOSCOPY
Anesthesia: General

## 2021-10-21 MED ORDER — PHENYLEPHRINE HCL (PRESSORS) 10 MG/ML IV SOLN
INTRAVENOUS | Status: DC | PRN
  Administered 2021-10-21 (×4): 80 ug via INTRAVENOUS

## 2021-10-21 MED ORDER — MIDAZOLAM HCL 2 MG/2ML IJ SOLN
INTRAMUSCULAR | Status: AC
  Filled 2021-10-21: qty 2

## 2021-10-21 MED ORDER — PROPOFOL 500 MG/50ML IV EMUL
INTRAVENOUS | Status: DC | PRN
  Administered 2021-10-21: 200 ug/kg/min via INTRAVENOUS

## 2021-10-21 MED ORDER — LIDOCAINE HCL (PF) 2 % IJ SOLN
INTRAMUSCULAR | Status: DC | PRN
  Administered 2021-10-21: 50 mg via INTRADERMAL

## 2021-10-21 MED ORDER — PROPOFOL 10 MG/ML IV BOLUS
INTRAVENOUS | Status: DC | PRN
  Administered 2021-10-21: 50 mg via INTRAVENOUS

## 2021-10-21 MED ORDER — SODIUM CHLORIDE 0.9 % IV SOLN: INTRAVENOUS | Status: DC

## 2021-10-21 NOTE — Anesthesia Postprocedure Evaluation (Signed)
Anesthesia Post Note  Patient: Stacy Daniel  Procedure(s) Performed: COLONOSCOPY  Patient location during evaluation: Endoscopy Anesthesia Type: General Level of consciousness: awake and alert Pain management: pain level controlled Vital Signs Assessment: post-procedure vital signs reviewed and stable Respiratory status: spontaneous breathing, nonlabored ventilation and respiratory function stable Cardiovascular status: blood pressure returned to baseline and stable Postop Assessment: no apparent nausea or vomiting Anesthetic complications: no   No notable events documented.   Last Vitals:  Vitals:   10/21/21 1024 10/21/21 1033  BP: (!) 91/50 (!) 101/57  Pulse: 72 73  Resp: 14 18  Temp:    SpO2: 100% 100%    Last Pain:  Vitals:   10/21/21 1033  TempSrc:   PainSc: 0-No pain                 [place] Ouch

## 2021-10-21 NOTE — H&P (Signed)
Outpatient short stay form Pre-procedure 10/21/2021  [name], MD  Primary Physician: [name], MD  Reason for visit:  Screening  History of present illness:   51 y/o lady with history of multiple medical problems including arthritis, DM II, and obesity here for screening colonoscopy. Father had colon cancer at age 83. History of hysterectomy. No blood thinners.   Current Facility-Administered Medications:    0.9 %  sodium chloride infusion, , Intravenous, Continuous, Kanisha Duba, Hilton Cork, MD  Medications Prior to Admission  Medication Sig Dispense Refill Last Dose   AJOVY 225 MG/1.5ML SOSY    Past Month   albuterol (VENTOLIN HFA) 108 (90 Base) MCG/ACT inhaler ProAir HFA   Past Week   Ascorbic Acid (VITAMIN C ER PO) Take 120 mg by mouth.   Past Week   b complex vitamins tablet Take 1 tablet by mouth daily.   Past Week   BD PEN NEEDLE NANO U/F 32G X 4 MM MISC    10/20/2021   Blood Glucose Monitoring Suppl (ONE TOUCH ULTRA 2) w/Device KIT BY XX ROUTE AS DIRECTED   10/20/2021   cholecalciferol (VITAMIN D) 1000 units tablet Take 1,000 Units by mouth 2 (two) times daily.   Past Week   diphenhydrAMINE (BENADRYL) 25 mg capsule Benadryl   Past Week   EPINEPHrine 0.3 mg/0.3 mL IJ SOAJ injection EpiPen 2-Pak   Past Month   estradiol (ESTRACE) 1 MG tablet Take 1 tablet (1 mg total) by mouth daily. 90 tablet 2 Past Week   Ferrous Sulfate (IRON) 142 (45 Fe) MG TBCR Take by mouth.   Past Week   Flaxseed, Linseed, (FLAX SEED OIL PO) Take by mouth.   Past Week   folic acid (FOLVITE) 546 MCG tablet Take 400 mcg by mouth daily. Reported on 02/18/2016   Past Week   gabapentin (NEURONTIN) 300 MG capsule Take 300 mg by mouth 4 (four) times daily.   10/20/2021 at 0800   glipiZIDE (GLUCOTROL XL) 2.5 MG 24 hr tablet Take by mouth.   Past Week   ibuprofen (ADVIL) 200 MG tablet Advil   Past Week   ketoconazole (NIZORAL) 2 % shampoo    Past Week   levalbuterol (XOPENEX HFA) 45 MCG/ACT inhaler  Xopenex HFA   Past Week   levocetirizine (XYZAL) 5 MG tablet Take 5 mg by mouth every evening.   Past Week   levothyroxine (SYNTHROID, LEVOTHROID) 75 MCG tablet Take 75 mcg by mouth at bedtime.   Past Week   LORazepam (ATIVAN) 1 MG tablet Take 1 mg by mouth 2 (two) times daily. Take morning and night.   Past Week   Magnesium 250 MG TABS Take by mouth.   Past Week   metFORMIN (GLUCOPHAGE) 500 MG tablet Take 1,000 mg by mouth 2 (two) times daily.   Past Week   montelukast (SINGULAIR) 10 MG tablet Take 10 mg by mouth at bedtime.   Past Week   Nebulizers (VIOS AEROSOL DELIVERY SYSTEM) MISC U UTD  1 Past Week   niacin 500 MG tablet Take 500 mg by mouth at bedtime.   Past Week   omeprazole (PRILOSEC) 20 MG capsule Take 20 mg by mouth daily. At night.   Past Week   OneTouch Delica Lancets 27O MISC Apply 1 each topically 2 (two) times daily.   Past Week   OZEMPIC, 1 MG/DOSE, 4 MG/3ML SOPN Inject 0.75 mLs into the skin every 7 (seven) days.   Past Week   polyethylene glycol-electrolytes (NULYTELY) 420  g solution Take 4,000 mLs by mouth once.   Past Week   PULMICORT FLEXHALER 180 MCG/ACT inhaler    Past Week   RELPAX 40 MG tablet TK 1 T PO AT ONSET MAY REPEAT Q 2 H  NOT TO EXCEED 5 TS  D  4 Past Week   rizatriptan (MAXALT) 10 MG tablet TK 1 T PO AOS OF HEADACHE. MAY REPEAT IN 2 H IF NO IMPROVEMENT. MAXIMUM OF 30 MG PER DAY   Past Week   Saccharomyces boulardii (PROBIOTIC) 250 MG CAPS Take by mouth.   Past Week   thiothixene (NAVANE) 1 MG capsule TAKE 1 CAPSULE BY MOUTH 3 TIMES A DAY AND TAKE 3 CAPSULES AT BEDTIME AS DIRECTED   Past Week   tiZANidine (ZANAFLEX) 2 MG tablet Take 2 mg by mouth 3 (three) times daily. Take half tablet every 4 hours.   Past Week   valACYclovir (VALTREX) 1000 MG tablet Take 1,000 mg by mouth 3 (three) times daily.   Past Week   venlafaxine XR (EFFEXOR-XR) 150 MG 24 hr capsule Effexor XR   Past Week   VICTOZA 18 MG/3ML SOPN    Past Week   vitamin B-12 (CYANOCOBALAMIN) 1000 MCG  tablet Take by mouth.   Past Week   VRAYLAR 4.5 MG CAPS Take 1 capsule by mouth daily.   Past Week   Multiple Vitamins-Minerals (MULTIVITAMIN ADULT EXTRA C PO)       ONE TOUCH ULTRA TEST test strip USE TO CHECK BLOOD SUGARS 2 TO 3 TIMES A DAY UTD FOR 90 DAYS  3    zafirlukast (ACCOLATE) 10 MG tablet zafirlukast        Allergies  Allergen Reactions   Albuterol Anaphylaxis   Aripiprazole Anaphylaxis   Azelastine Anaphylaxis   Budesonide Anaphylaxis   Budesonide-Formoterol Fumarate Anaphylaxis   Ciclesonide Anaphylaxis   Fluticasone Anaphylaxis   Fluticasone Furoate Anaphylaxis   Fluticasone-Salmeterol Anaphylaxis   Gadolinium Derivatives Anaphylaxis   Hydrocodone-Acetaminophen Anaphylaxis   Iodinated Contrast Media Anaphylaxis    Other reaction(s): Unknown   Iodine Anaphylaxis    And iodide containing products    Latex Anaphylaxis   Mometasone Anaphylaxis   Other Anaphylaxis    Petrochemical products   Peanut-Containing Drug Products Anaphylaxis   Penicillins Anaphylaxis   Petrolatum Anaphylaxis    Yellow    Petroleum Distillate Anaphylaxis and Rash    Other reaction(s): Angioedema Teaching laboratory technician) (plastics)    Tamsulosin Anaphylaxis   Tdap [Tetanus-Diphth-Acell Pertussis] Anaphylaxis   Venlafaxine Other (See Comments)    Migraine, causes eye to close   Zinc Oxide Anaphylaxis   Albuterol Sulfate Other (See Comments)    Gi distress    Antihistamines, Chlorpheniramine-Type Other (See Comments)    Gi distress    Cetirizine & Related Other (See Comments)    Gi distress    Diphenhydramine     Other reaction(s): Other (See Comments), Other (See Comments) Doesn't work-can take Benadryl Doesn't work-can take Benadryl    Duloxetine Other (See Comments)    Gi distress    Fexofenadine Other (See Comments)    Gi distress    Gluten Meal     Other reaction(s): Other (See Comments), Other (See Comments) Doesn't digest Doesn't digest    Haloperidol And Related Other  (See Comments)    Gi distress    Ipratropium-Albuterol Other (See Comments)    Gi distress    Ipratropium-Albuterol Other (See Comments)   Lac Bovis     Other reaction(s): Angioedema   Levothyroxine Other (  See Comments)    Gi distress    Levothyroxine Sodium Nausea And Vomiting   Metformin And Related Nausea And Vomiting   Montelukast     Other reaction(s): Mental Status Changes (intolerance), Other (See Comments)   Montelukast Sodium Other (See Comments)    Gi distress    Omeprazole Nausea And Vomiting   Pineapple Other (See Comments)    Other reaction(s): Other (See Comments) unknown unknown    Red Dye Other (See Comments)    Other reaction(s): GI Upset (intolerance), Other (See Comments) migraines migraines    Sitagliptin Nausea And Vomiting   Ziprasidone Hcl Other (See Comments)    Gi distress    Adhesive [Tape] Hives and Rash   Bupropion Anxiety     Past Medical History:  Diagnosis Date   Angioedema    Anxiety    Bipolar 1 disorder ([place])    Borderline personality disorder ([place])    Breast pain in female    chronic, right   Chronic bronchitis (HCC)    COPD (chronic obstructive pulmonary disease) (HCC)    Diabetes mellitus without complication (HCC)    Fibromyalgia    Hypoglycemia    IC (interstitial cystitis)    OCD (obsessive compulsive disorder)    Osteoarthritis    Osteoarthritis    PCOS (polycystic ovarian syndrome)    Post traumatic stress disorder    Rheumatoid arthritis (HCC)    Rheumatoid arthritis (HCC)    Schizoaffective disorder, bipolar type (HCC)    Shingles (herpes zoster) polyneuropathy     Review of systems:  Otherwise negative.    Physical Exam  Gen: Alert, oriented. Appears stated age.  HEENT: PERRLA. Lungs: No respiratory distress CV: RRR Abd: soft, benign, no masses Ext: No edema    Planned procedures: Proceed with colonoscopy. The patient understands the nature of the planned procedure, indications, risks,  alternatives and potential complications including but not limited to bleeding, infection, perforation, damage to internal organs and possible oversedation/side effects from anesthesia. The patient agrees and gives consent to proceed.  Please refer to procedure notes for findings, recommendations and patient disposition/instructions.     [name], MD [facility]

## 2021-10-21 NOTE — Anesthesia Preprocedure Evaluation (Addendum)
Anesthesia Evaluation  Patient identified by MRN, date of birth, ID band Patient awake    Reviewed: Allergy & Precautions, NPO status , Patient's Chart, lab work & pertinent test results  Airway Mallampati: II  TM Distance: >3 FB Neck ROM: full    Dental  (+) Missing, Poor Dentition,    Pulmonary COPD,    Pulmonary exam normal        Cardiovascular negative cardio ROS Normal cardiovascular exam     Neuro/Psych PSYCHIATRIC DISORDERS Bipolar Disorder Borderline personality disorderHistory of Seizure like activity    GI/Hepatic GERD  Controlled and Medicated,Fatty liver disease   Endo/Other  diabetes, Well Controlled, Type 2, Oral Hypoglycemic AgentsHypothyroidism   Renal/GU negative Renal ROS  negative genitourinary   Musculoskeletal  (+) Fibromyalgia -  Abdominal (+) + obese,   Peds  Hematology negative hematology ROS (+)   Anesthesia Other Findings Past Medical History: No date: Angioedema No date: Anxiety No date: Bipolar 1 disorder (HCC) No date: Borderline personality disorder (HCC) No date: Breast pain in female     Comment:  chronic, right No date: Chronic bronchitis (HCC) No date: COPD (chronic obstructive pulmonary disease) (HCC) No date: Diabetes mellitus without complication (HCC) No date: Fibromyalgia No date: Hypoglycemia No date: IC (interstitial cystitis) No date: OCD (obsessive compulsive disorder) No date: Osteoarthritis No date: Osteoarthritis No date: PCOS (polycystic ovarian syndrome) No date: Post traumatic stress disorder No date: Rheumatoid arthritis ([place]) No date: Rheumatoid arthritis ([place]) No date: Schizoaffective disorder, bipolar type (HCC) No date: Shingles (herpes zoster) polyneuropathy 02/14/16: Stroke ([place])     Comment:  TIA  Past Surgical History: 06/1997: ABDOMINAL HYSTERECTOMY     Comment:  with left ovarectomy 11/2010: GANGLION CYST EXCISION     Comment:  right  wrist 1995: OOPHORECTOMY; Right 1988: WISDOM TOOTH EXTRACTION     Reproductive/Obstetrics negative OB ROS                            Anesthesia Physical Anesthesia Plan  ASA: 3  Anesthesia Plan: General   Post-op Pain Management:    Induction: Intravenous  PONV Risk Score and Plan: Propofol infusion and TIVA  Airway Management Planned: Natural Airway and Simple Face Mask  Additional Equipment:   Intra-op Plan:   Post-operative Plan:   Informed Consent: I have reviewed the patients History and Physical, chart, labs and discussed the procedure including the risks, benefits and alternatives for the proposed anesthesia with the patient or authorized representative who has indicated his/her understanding and acceptance.     Dental advisory given  Plan Discussed with: Anesthesiologist, CRNA and Surgeon  Anesthesia Plan Comments:        Anesthesia Quick Evaluation

## 2021-10-21 NOTE — Transfer of Care (Signed)
Immediate Anesthesia Transfer of Care Note  Patient: Stacy Daniel  Procedure(s) Performed: COLONOSCOPY  Patient Location: PACU  Anesthesia Type:General  Level of Consciousness: drowsy  Airway & Oxygen Therapy: Patient Spontanous Breathing and Patient connected to nasal cannula oxygen  Post-op Assessment: Report given to RN and Post -op Vital signs reviewed and stable  Post vital signs: Reviewed and stable  Last Vitals:  Vitals Value Taken Time  BP 99/45 10/21/21 1004  Temp 35.6 C 10/21/21 1004  Pulse 82 10/21/21 1004  Resp 19 10/21/21 1004  SpO2 98 % 10/21/21 1004  Vitals shown include unvalidated device data.  Last Pain:  Vitals:   10/21/21 1004  TempSrc: Temporal  PainSc: Asleep         Complications: No notable events documented.

## 2021-10-21 NOTE — Op Note (Signed)
[facility] Patient Name: Stacy Daniel Procedure Date: 10/21/2021 9:12 AM MRN: [id number] Account #: [id number] Date of Birth: Sep 22, 1970 Admit Type: Outpatient Age: 51 Room: [facility] ENDO ROOM 1 Gender: Female Note Status: Finalized Instrument Name: Jasper Riling [id number] Procedure:             Colonoscopy Indications:           Screening patient at increased risk: Family history of                         1st-degree relative with colorectal cancer at age 87                         years (or older) Providers:             Andrey Farmer MD, MD Referring MD:          [name] (Referring MD) Medicines:             Monitored Anesthesia Care Complications:         No immediate complications. Procedure:             Pre-Anesthesia Assessment:                        - Prior to the procedure, a History and Physical was                         performed, and patient medications and allergies were                         reviewed. The patient is competent. The risks and                         benefits of the procedure and the sedation options and                         risks were discussed with the patient. All questions                         were answered and informed consent was obtained.                         Patient identification and proposed procedure were                         verified by the physician, the nurse, the                         anesthesiologist, the anesthetist and the technician                         in the endoscopy suite. Mental Status Examination:                         alert and oriented. Airway Examination: normal                         oropharyngeal airway and neck mobility. Respiratory  Examination: clear to auscultation. CV Examination:                         normal. Prophylactic Antibiotics: The patient does not                         require prophylactic antibiotics. Prior                          Anticoagulants: The patient has taken no previous                         anticoagulant or antiplatelet agents. ASA Grade                         Assessment: II - A patient with mild systemic disease.                         After reviewing the risks and benefits, the patient                         was deemed in satisfactory condition to undergo the                         procedure. The anesthesia plan was to use monitored                         anesthesia care (MAC). Immediately prior to                         administration of medications, the patient was                         re-assessed for adequacy to receive sedatives. The                         heart rate, respiratory rate, oxygen saturations,                         blood pressure, adequacy of pulmonary ventilation, and                         response to care were monitored throughout the                         procedure. The physical status of the patient was                         re-assessed after the procedure.                        After obtaining informed consent, the colonoscope was                         passed under direct vision. Throughout the procedure,                         the patient's blood pressure, pulse, and oxygen  saturations were monitored continuously. The                         Colonoscope was introduced through the anus and                         advanced to the the terminal ileum. The colonoscopy                         was performed without difficulty. The patient                         tolerated the procedure well. The quality of the bowel                         preparation was good. Findings:      The perianal and digital rectal examinations were normal.      The terminal ileum appeared normal.      Internal hemorrhoids were found during retroflexion. The hemorrhoids       were Grade I (internal hemorrhoids that do not prolapse).      The exam  was otherwise without abnormality on direct and retroflexion       views. Impression:            - The examined portion of the ileum was normal.                        - Internal hemorrhoids.                        - The examination was otherwise normal on direct and                         retroflexion views.                        - No specimens collected. Recommendation:        - Discharge patient to home.                        - Resume previous diet.                        - Continue present medications.                        - Repeat colonoscopy in 10 years for screening                         purposes.                        - Return to referring physician as previously                         scheduled. Procedure Code(s):     --- Professional ---                        O1308, Colorectal cancer screening; colonoscopy on  individual at high risk Diagnosis Code(s):     --- Professional ---                        Z80.0, Family history of malignant neoplasm of                         digestive organs                        K64.0, First degree hemorrhoids CPT copyright 2019 [employer]. All rights reserved. The codes documented in this report are preliminary and upon coder review may  be revised to meet current compliance requirements. Andrey Farmer MD, MD 10/21/2021 10:03:58 AM Number of Addenda: 0 Note Initiated On: 10/21/2021 9:12 AM Scope Withdrawal Time: 0 hours 10 minutes 20 seconds  Total Procedure Duration: 0 hours 24 minutes 18 seconds  Estimated Blood Loss:  Estimated blood loss: none.      [facility]

## 2021-10-21 NOTE — Interval H&P Note (Signed)
History and Physical Interval Note:  10/21/2021 9:33 AM  Stacy Daniel  has presented today for surgery, with the diagnosis of FAMILY HISTORY OF COLON CANCER.  The various methods of treatment have been discussed with the patient and family. After consideration of risks, benefits and other options for treatment, the patient has consented to  Procedure(s) with comments: COLONOSCOPY (N/A) - DM as a surgical intervention.  The patient's history has been reviewed, patient examined, no change in status, stable for surgery.  I have reviewed the patient's chart and labs.  Questions were answered to the patient's satisfaction.     Stacy Daniel  Ok to proceed with colonoscopy

## 2021-10-22 ENCOUNTER — Encounter: Payer: Self-pay | Admitting: Gastroenterology

## 2021-10-29 ENCOUNTER — Ambulatory Visit: Payer: PPO | Admitting: Dietician

## 2021-11-10 DIAGNOSIS — K760 Fatty (change of) liver, not elsewhere classified: Secondary | ICD-10-CM | POA: Diagnosis not present

## 2021-11-11 DIAGNOSIS — G479 Sleep disorder, unspecified: Secondary | ICD-10-CM | POA: Diagnosis not present

## 2021-11-11 DIAGNOSIS — R251 Tremor, unspecified: Secondary | ICD-10-CM | POA: Diagnosis not present

## 2021-11-11 DIAGNOSIS — M6289 Other specified disorders of muscle: Secondary | ICD-10-CM | POA: Diagnosis not present

## 2021-11-11 DIAGNOSIS — F411 Generalized anxiety disorder: Secondary | ICD-10-CM | POA: Diagnosis not present

## 2021-11-11 DIAGNOSIS — R519 Headache, unspecified: Secondary | ICD-10-CM | POA: Diagnosis not present

## 2021-11-11 DIAGNOSIS — Z6841 Body Mass Index (BMI) 40.0 and over, adult: Secondary | ICD-10-CM | POA: Diagnosis not present

## 2021-11-26 DIAGNOSIS — Z0000 Encounter for general adult medical examination without abnormal findings: Secondary | ICD-10-CM | POA: Diagnosis not present

## 2021-11-26 DIAGNOSIS — K219 Gastro-esophageal reflux disease without esophagitis: Secondary | ICD-10-CM | POA: Diagnosis not present

## 2021-11-26 DIAGNOSIS — N281 Cyst of kidney, acquired: Secondary | ICD-10-CM | POA: Diagnosis not present

## 2021-11-26 DIAGNOSIS — K760 Fatty (change of) liver, not elsewhere classified: Secondary | ICD-10-CM | POA: Diagnosis not present

## 2021-11-26 DIAGNOSIS — E559 Vitamin D deficiency, unspecified: Secondary | ICD-10-CM | POA: Diagnosis not present

## 2021-11-26 DIAGNOSIS — E039 Hypothyroidism, unspecified: Secondary | ICD-10-CM | POA: Diagnosis not present

## 2021-11-26 DIAGNOSIS — J4530 Mild persistent asthma, uncomplicated: Secondary | ICD-10-CM | POA: Diagnosis not present

## 2021-11-26 DIAGNOSIS — J3089 Other allergic rhinitis: Secondary | ICD-10-CM | POA: Diagnosis not present

## 2021-11-26 DIAGNOSIS — E7800 Pure hypercholesterolemia, unspecified: Secondary | ICD-10-CM | POA: Diagnosis not present

## 2021-11-26 DIAGNOSIS — F319 Bipolar disorder, unspecified: Secondary | ICD-10-CM | POA: Diagnosis not present

## 2021-11-26 DIAGNOSIS — E119 Type 2 diabetes mellitus without complications: Secondary | ICD-10-CM | POA: Diagnosis not present

## 2021-11-26 DIAGNOSIS — G43109 Migraine with aura, not intractable, without status migrainosus: Secondary | ICD-10-CM | POA: Diagnosis not present

## 2021-11-27 DIAGNOSIS — F3181 Bipolar II disorder: Secondary | ICD-10-CM | POA: Diagnosis not present

## 2021-11-27 DIAGNOSIS — F4310 Post-traumatic stress disorder, unspecified: Secondary | ICD-10-CM | POA: Diagnosis not present

## 2021-12-01 ENCOUNTER — Ambulatory Visit: Payer: PPO | Admitting: Dietician

## 2021-12-30 DIAGNOSIS — G8929 Other chronic pain: Secondary | ICD-10-CM | POA: Diagnosis not present

## 2021-12-30 DIAGNOSIS — M545 Low back pain, unspecified: Secondary | ICD-10-CM | POA: Diagnosis not present

## 2021-12-30 DIAGNOSIS — M6289 Other specified disorders of muscle: Secondary | ICD-10-CM | POA: Diagnosis not present

## 2022-01-01 ENCOUNTER — Encounter: Payer: Self-pay | Admitting: Dietician

## 2022-01-01 NOTE — Progress Notes (Signed)
Patient cancelled her follow up MNT appointment for 12/01/21 (rescheduled from 10/29/21). Have not yet heard back from her to reschedule again; sent notification to referring provider. ?

## 2022-01-16 DIAGNOSIS — M797 Fibromyalgia: Secondary | ICD-10-CM | POA: Diagnosis not present

## 2022-01-16 DIAGNOSIS — M542 Cervicalgia: Secondary | ICD-10-CM | POA: Diagnosis not present

## 2022-01-16 DIAGNOSIS — R5383 Other fatigue: Secondary | ICD-10-CM | POA: Diagnosis not present

## 2022-02-09 DIAGNOSIS — R7989 Other specified abnormal findings of blood chemistry: Secondary | ICD-10-CM | POA: Diagnosis not present

## 2022-02-09 DIAGNOSIS — E119 Type 2 diabetes mellitus without complications: Secondary | ICD-10-CM | POA: Diagnosis not present

## 2022-02-09 DIAGNOSIS — E039 Hypothyroidism, unspecified: Secondary | ICD-10-CM | POA: Diagnosis not present

## 2022-02-19 DIAGNOSIS — F3181 Bipolar II disorder: Secondary | ICD-10-CM | POA: Diagnosis not present

## 2022-04-01 ENCOUNTER — Ambulatory Visit
Admission: RE | Admit: 2022-04-01 | Discharge: 2022-04-01 | Disposition: A | Discharge status: Home / Self Care | Payer: PPO | Source: Ambulatory Visit | Attending: Urology | Admitting: Urology

## 2022-04-01 DIAGNOSIS — N281 Cyst of kidney, acquired: Secondary | ICD-10-CM | POA: Insufficient documentation

## 2022-04-01 DIAGNOSIS — K8020 Calculus of gallbladder without cholecystitis without obstruction: Secondary | ICD-10-CM | POA: Diagnosis not present

## 2022-04-01 DIAGNOSIS — N2889 Other specified disorders of kidney and ureter: Secondary | ICD-10-CM | POA: Diagnosis not present

## 2022-04-01 DIAGNOSIS — C801 Malignant (primary) neoplasm, unspecified: Secondary | ICD-10-CM | POA: Diagnosis not present

## 2022-04-07 ENCOUNTER — Encounter: Payer: Self-pay | Admitting: Urology

## 2022-04-07 ENCOUNTER — Ambulatory Visit: Payer: PPO | Admitting: Urology

## 2022-04-07 VITALS — BP 110/74 | HR 93 | Ht 63.0 in | Wt 199.0 lb

## 2022-04-07 DIAGNOSIS — N281 Cyst of kidney, acquired: Secondary | ICD-10-CM | POA: Diagnosis not present

## 2022-04-07 DIAGNOSIS — N2889 Other specified disorders of kidney and ureter: Secondary | ICD-10-CM

## 2022-04-07 NOTE — Progress Notes (Signed)
04/07/22 12:22 PM   [name] June 28, 1971 [id number]  Referring provider:  [name], MD [place],  [place] [address] Chief Complaint  Patient presents with   Cystitis      HPI: Stacy Daniel is a 51 y.o.female with a personal history of interstitial cystitis and cystic lesion on kidney, who presents today for MRI results.   She was diagnosed w/ elevated LFTs by Dr. Allen Norris at [facility]. She had an extensive workup including a hepatitis panel and elastography.    She underwent a ultrasound abdomen on 08/21/2021 that revealed complex multiloculated cystic lesion of the LEFT kidney which measures 6.6 cm in greatest size, significantly increased since 2011.   She underwent an abdominal MRI on 04/01/2022. It visualized Stable Bosniak classification 64F left renal cysts. The large majority of Bosniak IIF masses are benign. Stable 13 mm benign left adrenal adenoma   She denies any flank pain or hematuria.  No urinary symptoms.  PMH: Past Medical History:  Diagnosis Date   Angioedema    Anxiety    Bipolar 1 disorder ([place])    Borderline personality disorder ([place])    Breast pain in female    chronic, right   Chronic bronchitis (HCC)    COPD (chronic obstructive pulmonary disease) (HCC)    Diabetes mellitus without complication (HCC)    Fibromyalgia    Hypoglycemia    IC (interstitial cystitis)    OCD (obsessive compulsive disorder)    Osteoarthritis    Osteoarthritis    PCOS (polycystic ovarian syndrome)    Post traumatic stress disorder    Rheumatoid arthritis (HCC)    Rheumatoid arthritis (HCC)    Schizoaffective disorder, bipolar type ([place])    Shingles (herpes zoster) polyneuropathy     Surgical History: Past Surgical History:  Procedure Laterality Date   ABDOMINAL HYSTERECTOMY  06/1997   with left ovarectomy   COLONOSCOPY N/A 10/21/2021   Procedure: COLONOSCOPY;  Surgeon: [name], MD;  Location: [facility]  ENDOSCOPY;  Service: Endoscopy;  Laterality: N/A;  DM   GANGLION CYST EXCISION  11/2010   right wrist   OOPHORECTOMY Right 1995   WISDOM TOOTH EXTRACTION  1988    Home Medications:  Allergies as of 04/07/2022       Reactions   Albuterol Anaphylaxis   Aripiprazole Anaphylaxis   Azelastine Anaphylaxis   Budesonide Anaphylaxis   Budesonide-formoterol Fumarate Anaphylaxis   Ciclesonide Anaphylaxis   Fluticasone Anaphylaxis   Fluticasone Furoate Anaphylaxis   Fluticasone-salmeterol Anaphylaxis   Gadolinium Derivatives Anaphylaxis   Hydrocodone-acetaminophen Anaphylaxis   Iodinated Contrast Media Anaphylaxis   Other reaction(s): Unknown   Iodine Anaphylaxis   And iodide containing products   Latex Anaphylaxis   Mometasone Anaphylaxis   Other Anaphylaxis   Petrochemical products   Peanut-containing Drug Products Anaphylaxis   Penicillins Anaphylaxis   Petrolatum Anaphylaxis   Yellow   Petroleum Distillate Anaphylaxis, Rash   Other reaction(s): Angioedema Teaching laboratory technician) (plastics)   Tamsulosin Anaphylaxis   Tdap [tetanus-diphth-acell Pertussis] Anaphylaxis   Venlafaxine Other (See Comments)   Migraine, causes eye to close   Zinc Oxide Anaphylaxis   Albuterol Sulfate Other (See Comments)   Gi distress   Antihistamines, Chlorpheniramine-type Other (See Comments)   Gi distress   Cetirizine & Related Other (See Comments)   Gi distress   Diphenhydramine    Other reaction(s): Other (See Comments), Other (See Comments) Doesn't work-can take Benadryl Doesn't work-can take Benadryl   Duloxetine Other (See Comments)  Gi distress   Fexofenadine Other (See Comments)   Gi distress   Gluten Meal    Other reaction(s): Other (See Comments), Other (See Comments) Doesn't digest Doesn't digest   Haloperidol And Related Other (See Comments)   Gi distress   Ipratropium-albuterol Other (See Comments)   Gi distress   Ipratropium-albuterol Other (See Comments)   Levothyroxine Other (See  Comments)   Gi distress   Levothyroxine Sodium Nausea And Vomiting   Metformin And Related Nausea And Vomiting   Milk (cow)    Other reaction(s): Angioedema   Montelukast    Other reaction(s): Mental Status Changes (intolerance), Other (See Comments)   Montelukast Sodium Other (See Comments)   Gi distress   Omeprazole Nausea And Vomiting   Pineapple Other (See Comments)   Other reaction(s): Other (See Comments) unknown unknown   Red Dye Other (See Comments)   Other reaction(s): GI Upset (intolerance), Other (See Comments) migraines migraines   Sitagliptin Nausea And Vomiting   Ziprasidone Hcl Other (See Comments)   Gi distress   Adhesive [tape] Hives, Rash   Bupropion Anxiety        Medication List        Accurate as of April 07, 2022 12:22 PM. If you have any questions, ask your nurse or doctor.          STOP taking these medications    glipiZIDE 2.5 MG 24 hr tablet Commonly known as: GLUCOTROL XL Stopped by: Hollice Espy, MD   polyethylene glycol-electrolytes 420 g solution Commonly known as: NuLYTELY Stopped by: Hollice Espy, MD   valACYclovir 1000 MG tablet Commonly known as: VALTREX Stopped by: Hollice Espy, MD   Victoza 18 MG/3ML Sopn Generic drug: liraglutide Stopped by: Hollice Espy, MD   zafirlukast 10 MG tablet Commonly known as: ACCOLATE Stopped by: Hollice Espy, MD       TAKE these medications    Ajovy 225 MG/1.5ML Sosy Generic drug: Fremanezumab-vfrm   albuterol 108 (90 Base) MCG/ACT inhaler Commonly known as: VENTOLIN HFA ProAir HFA   b complex vitamins tablet Take 1 tablet by mouth daily.   BD Pen Needle Nano U/F 32G X 4 MM Misc Generic drug: Insulin Pen Needle   cholecalciferol 1000 units tablet Commonly known as: VITAMIN D Take 1,000 Units by mouth 2 (two) times daily.   cyanocobalamin 1000 MCG tablet Commonly known as: VITAMIN B12 Take by mouth.   diphenhydrAMINE 25 mg capsule Commonly known as:  BENADRYL Benadryl   EPINEPHrine 0.3 mg/0.3 mL Soaj injection Commonly known as: EPI-PEN EpiPen 2-Pak   estradiol 1 MG tablet Commonly known as: ESTRACE Take 1 tablet (1 mg total) by mouth daily.   FLAX SEED OIL PO Take by mouth.   folic acid 697 MCG tablet Commonly known as: FOLVITE Take 400 mcg by mouth daily. Reported on 02/18/2016   gabapentin 300 MG capsule Commonly known as: NEURONTIN Take 300 mg by mouth 4 (four) times daily.   ibuprofen 200 MG tablet Commonly known as: ADVIL Advil   Iron 142 (45 Fe) MG Tbcr Take by mouth.   ketoconazole 2 % shampoo Commonly known as: NIZORAL   levalbuterol 45 MCG/ACT inhaler Commonly known as: XOPENEX HFA Xopenex HFA   levocetirizine 5 MG tablet Commonly known as: XYZAL Take 5 mg by mouth every evening.   levothyroxine 75 MCG tablet Commonly known as: SYNTHROID Take 75 mcg by mouth at bedtime.   LORazepam 1 MG tablet Commonly known as: ATIVAN Take 1 mg by mouth 2 (two)  times daily. Take morning and night.   Magnesium 250 MG Tabs Take by mouth.   metFORMIN 500 MG tablet Commonly known as: GLUCOPHAGE Take 1,000 mg by mouth 2 (two) times daily.   montelukast 10 MG tablet Commonly known as: SINGULAIR Take 10 mg by mouth at bedtime.   MULTIVITAMIN ADULT EXTRA C PO   niacin 500 MG tablet Commonly known as: VITAMIN B3 Take 500 mg by mouth at bedtime.   omeprazole 20 MG capsule Commonly known as: PRILOSEC Take 20 mg by mouth daily. At night.   ONE TOUCH ULTRA 2 w/Device Kit BY XX ROUTE AS DIRECTED   ONE TOUCH ULTRA TEST test strip Generic drug: glucose blood USE TO CHECK BLOOD SUGARS 2 TO 3 TIMES A DAY UTD FOR 90 DAYS   OneTouch Delica Lancets 14E Misc Apply 1 each topically 2 (two) times daily.   Ozempic (1 MG/DOSE) 4 MG/3ML Sopn Generic drug: Semaglutide (1 MG/DOSE) Inject 0.75 mLs into the skin every 7 (seven) days.   Probiotic 250 MG Caps Take by mouth.   Pulmicort Flexhaler 180 MCG/ACT  inhaler Generic drug: budesonide   Relpax 40 MG tablet Generic drug: eletriptan TK 1 T PO AT ONSET MAY REPEAT Q 2 H  NOT TO EXCEED 5 TS  D   rizatriptan 10 MG tablet Commonly known as: MAXALT TK 1 T PO AOS OF HEADACHE. MAY REPEAT IN 2 H IF NO IMPROVEMENT. MAXIMUM OF 30 MG PER DAY   thiothixene 1 MG capsule Commonly known as: NAVANE TAKE 1 CAPSULE BY MOUTH 3 TIMES A DAY AND TAKE 3 CAPSULES AT BEDTIME AS DIRECTED   tiZANidine 2 MG tablet Commonly known as: ZANAFLEX Take 2 mg by mouth 3 (three) times daily. Take half tablet every 4 hours.   venlafaxine XR 150 MG 24 hr capsule Commonly known as: EFFEXOR-XR Effexor XR   Vios Aerosol Delivery System Misc U UTD   VITAMIN C ER PO Take 120 mg by mouth.   Vraylar 4.5 MG Caps Generic drug: Cariprazine HCl Take 1 capsule by mouth daily.        Allergies:  Allergies  Allergen Reactions   Albuterol Anaphylaxis   Aripiprazole Anaphylaxis   Azelastine Anaphylaxis   Budesonide Anaphylaxis   Budesonide-Formoterol Fumarate Anaphylaxis   Ciclesonide Anaphylaxis   Fluticasone Anaphylaxis   Fluticasone Furoate Anaphylaxis   Fluticasone-Salmeterol Anaphylaxis   Gadolinium Derivatives Anaphylaxis   Hydrocodone-Acetaminophen Anaphylaxis   Iodinated Contrast Media Anaphylaxis    Other reaction(s): Unknown   Iodine Anaphylaxis    And iodide containing products    Latex Anaphylaxis   Mometasone Anaphylaxis   Other Anaphylaxis    Petrochemical products   Peanut-Containing Drug Products Anaphylaxis   Penicillins Anaphylaxis   Petrolatum Anaphylaxis    Yellow    Petroleum Distillate Anaphylaxis and Rash    Other reaction(s): Angioedema Teaching laboratory technician) (plastics)    Tamsulosin Anaphylaxis   Tdap [Tetanus-Diphth-Acell Pertussis] Anaphylaxis   Venlafaxine Other (See Comments)    Migraine, causes eye to close   Zinc Oxide Anaphylaxis   Albuterol Sulfate Other (See Comments)    Gi distress    Antihistamines, Chlorpheniramine-Type  Other (See Comments)    Gi distress    Cetirizine & Related Other (See Comments)    Gi distress    Diphenhydramine     Other reaction(s): Other (See Comments), Other (See Comments) Doesn't work-can take Benadryl Doesn't work-can take Benadryl    Duloxetine Other (See Comments)    Gi distress    Fexofenadine Other (  See Comments)    Gi distress    Gluten Meal     Other reaction(s): Other (See Comments), Other (See Comments) Doesn't digest Doesn't digest    Haloperidol And Related Other (See Comments)    Gi distress    Ipratropium-Albuterol Other (See Comments)    Gi distress    Ipratropium-Albuterol Other (See Comments)   Levothyroxine Other (See Comments)    Gi distress    Levothyroxine Sodium Nausea And Vomiting   Metformin And Related Nausea And Vomiting   Milk (Cow)     Other reaction(s): Angioedema   Montelukast     Other reaction(s): Mental Status Changes (intolerance), Other (See Comments)   Montelukast Sodium Other (See Comments)    Gi distress    Omeprazole Nausea And Vomiting   Pineapple Other (See Comments)    Other reaction(s): Other (See Comments) unknown unknown    Red Dye Other (See Comments)    Other reaction(s): GI Upset (intolerance), Other (See Comments) migraines migraines    Sitagliptin Nausea And Vomiting   Ziprasidone Hcl Other (See Comments)    Gi distress    Adhesive [Tape] Hives and Rash   Bupropion Anxiety    Family History: Family History  Problem Relation Age of Onset   Stroke Paternal Aunt    Diabetes Father    Obesity Father    Hyperthyroidism Mother    Cancer Sister        skin   Osteoporosis Maternal Grandmother    Hyperthyroidism Maternal Grandmother    Bipolar disorder Maternal Grandmother    Heart attack Maternal Grandfather    Diabetes Paternal Grandmother    Heart attack Paternal Grandfather    Breast cancer Paternal Aunt    Diabetes Paternal Aunt    Breast cancer Cousin    Diabetes Cousin    Bipolar  disorder Cousin    Bladder Cancer Neg Hx    Prostate cancer Neg Hx    Kidney cancer Neg Hx    Ovarian cancer Neg Hx    Colon cancer Neg Hx    Heart disease Neg Hx     Social History:  reports that she has never smoked. She has never used smokeless tobacco. She reports that she does not drink alcohol and does not use drugs.   Physical Exam: BP 110/74   Pulse 93   Ht 5' 3"  (1.6 m)   Wt 199 lb (90.3 kg)   BMI 35.25 kg/m   Constitutional:  Alert and oriented, No acute distress. HEENT: [place] AT, moist mucus membranes.  Trachea midline, no masses. Cardiovascular: No clubbing, cyanosis, or edema. Respiratory: Normal respiratory effort, no increased work of breathing. Skin: No rashes, bruises or suspicious lesions. Neurologic: Grossly intact, no focal deficits, moving all 4 extremities. Psychiatric: Normal mood and affect.  Laboratory Data:  Lab Results  Component Value Date   CREATININE 0.57 02/14/2016    Pertinent Imaging: CLINICAL DATA:  Follow-up left renal cysts.   EXAM: MRI ABDOMEN WITHOUT CONTRAST   TECHNIQUE: Multiplanar multisequence MR imaging was performed without the administration of intravenous contrast.   COMPARISON:  MRI abdomen October 02, 2021 and August 21, 2010,   FINDINGS: Lower chest: No acute abnormality.   Hepatobiliary: Marked hepatic steatosis. No suspicious hepatic lesion on this noncontrast examination. Cholelithiasis without findings of acute cholecystitis. No biliary ductal dilation.   Pancreas: Intrinsic T1 signal of the pancreatic parenchyma is within normal limits. No pancreatic ductal dilation.   Spleen:  No splenomegaly.   Adrenals/Urinary Tract:  Stable benign left adrenal adenoma measuring 13 mm which requires no imaging follow-up. Right adrenal gland appears normal.   No hydronephrosis.   Stable subcentimeter benign right renal cysts.   No significant interval change in the left renal cysts of varying degrees of  complexity. Previously indexed renal cysts are as follows:   -Bosniak classification 60F multi septated left interpolar renal cyst measures 4.3 x 2.5 cm on image 20/4 previously 4.2 x 2.5 cm, no interval septal/wall thickening or nodularity identified on this noncontrast examination.   -Bosniak classification 60F multi septated left lower pole renal cyst measures 7.3 x 4.7 cm on image 25/4 previously 7.2 x 5.1 cm when remeasured for consistency no interval septal/wall thickening or nodularity identified on this noncontrast examination.   Stomach/Bowel: Visualized portions within the abdomen are unremarkable.   Vascular/Lymphatic: Normal caliber thoracic aorta. No pathologically enlarged abdominal lymph nodes.   Other:  No significant abdominal free fluid.   Musculoskeletal: No suspicious osseous lesion.   IMPRESSION: 1. Stable Bosniak classification 60F left renal cysts. The large majority of Bosniak IIF masses are benign. When malignant, nearly all are indolent. Recommend follow-up renal mass protocol MRI with and without contrast in 6 months to ensure continued stability. (Reference: Bosniak Classification of Cystic Renal Masses, Version 2019. Radiology 2019; 292(2): 252-712.) 2. No significant interval change in the additional bilateral renal cysts. 3. Hepatic steatosis. 4. Stable 13 mm benign left adrenal adenoma which requires no imaging follow-up. 5. Cholelithiasis without findings of acute cholecystitis.     Electronically Signed   By: Dahlia Bailiff M.D.   On: 04/02/2022 10:26   I have personally reviewed the images and agree with radiologist interpretation.     Assessment & Plan:   Renal cyst  - MRI was reassuring, no interval change in 6 mo  - Will surveillance with MRI in 1 year then deescalate imaging. She is agreeable with this plan.   2. IC (interstitial cystitis)  - Stable  - Not currently on any medications   F/u 1 year with abd MRI   Conley Rolls as a scribe for Hollice Espy, MD.,have documented all relevant documentation on the behalf of Hollice Espy, MD,as directed by  Hollice Espy, MD while in the presence of Hollice Espy, MD.  I have reviewed the above documentation for accuracy and completeness, and I agree with the above.   Hollice Espy, MD  [facility] [address] [place], [place] [address] [phone]

## 2022-04-09 DIAGNOSIS — R5383 Other fatigue: Secondary | ICD-10-CM | POA: Diagnosis not present

## 2022-04-09 DIAGNOSIS — M797 Fibromyalgia: Secondary | ICD-10-CM | POA: Diagnosis not present

## 2022-05-13 ENCOUNTER — Other Ambulatory Visit: Payer: Self-pay | Admitting: Gastroenterology

## 2022-05-13 DIAGNOSIS — K760 Fatty (change of) liver, not elsewhere classified: Secondary | ICD-10-CM | POA: Diagnosis not present

## 2022-05-14 DIAGNOSIS — M6289 Other specified disorders of muscle: Secondary | ICD-10-CM | POA: Diagnosis not present

## 2022-05-14 DIAGNOSIS — R251 Tremor, unspecified: Secondary | ICD-10-CM | POA: Diagnosis not present

## 2022-05-14 DIAGNOSIS — R519 Headache, unspecified: Secondary | ICD-10-CM | POA: Diagnosis not present

## 2022-05-14 DIAGNOSIS — F411 Generalized anxiety disorder: Secondary | ICD-10-CM | POA: Diagnosis not present

## 2022-05-14 DIAGNOSIS — G479 Sleep disorder, unspecified: Secondary | ICD-10-CM | POA: Diagnosis not present

## 2022-05-15 ENCOUNTER — Ambulatory Visit
Admission: RE | Admit: 2022-05-15 | Discharge: 2022-05-15 | Disposition: A | Discharge status: Home / Self Care | Payer: PPO | Source: Ambulatory Visit | Attending: Gastroenterology | Admitting: Gastroenterology

## 2022-05-15 DIAGNOSIS — K760 Fatty (change of) liver, not elsewhere classified: Secondary | ICD-10-CM | POA: Diagnosis not present

## 2022-05-15 DIAGNOSIS — K8020 Calculus of gallbladder without cholecystitis without obstruction: Secondary | ICD-10-CM | POA: Diagnosis not present

## 2022-06-02 DIAGNOSIS — N281 Cyst of kidney, acquired: Secondary | ICD-10-CM | POA: Diagnosis not present

## 2022-06-02 DIAGNOSIS — E559 Vitamin D deficiency, unspecified: Secondary | ICD-10-CM | POA: Diagnosis not present

## 2022-06-02 DIAGNOSIS — K760 Fatty (change of) liver, not elsewhere classified: Secondary | ICD-10-CM | POA: Diagnosis not present

## 2022-06-02 DIAGNOSIS — Z23 Encounter for immunization: Secondary | ICD-10-CM | POA: Diagnosis not present

## 2022-06-02 DIAGNOSIS — G43109 Migraine with aura, not intractable, without status migrainosus: Secondary | ICD-10-CM | POA: Diagnosis not present

## 2022-06-02 DIAGNOSIS — F319 Bipolar disorder, unspecified: Secondary | ICD-10-CM | POA: Diagnosis not present

## 2022-06-02 DIAGNOSIS — E119 Type 2 diabetes mellitus without complications: Secondary | ICD-10-CM | POA: Diagnosis not present

## 2022-06-02 DIAGNOSIS — E7800 Pure hypercholesterolemia, unspecified: Secondary | ICD-10-CM | POA: Diagnosis not present

## 2022-06-02 DIAGNOSIS — K219 Gastro-esophageal reflux disease without esophagitis: Secondary | ICD-10-CM | POA: Diagnosis not present

## 2022-06-02 DIAGNOSIS — E039 Hypothyroidism, unspecified: Secondary | ICD-10-CM | POA: Diagnosis not present

## 2022-06-02 DIAGNOSIS — J3089 Other allergic rhinitis: Secondary | ICD-10-CM | POA: Diagnosis not present

## 2022-06-02 DIAGNOSIS — J4530 Mild persistent asthma, uncomplicated: Secondary | ICD-10-CM | POA: Diagnosis not present

## 2022-06-12 DIAGNOSIS — G43009 Migraine without aura, not intractable, without status migrainosus: Secondary | ICD-10-CM | POA: Diagnosis not present

## 2022-06-12 DIAGNOSIS — J3089 Other allergic rhinitis: Secondary | ICD-10-CM | POA: Diagnosis not present

## 2022-06-12 DIAGNOSIS — K219 Gastro-esophageal reflux disease without esophagitis: Secondary | ICD-10-CM | POA: Diagnosis not present

## 2022-06-12 DIAGNOSIS — J301 Allergic rhinitis due to pollen: Secondary | ICD-10-CM | POA: Diagnosis not present

## 2022-06-12 DIAGNOSIS — J4540 Moderate persistent asthma, uncomplicated: Secondary | ICD-10-CM | POA: Diagnosis not present

## 2022-06-12 DIAGNOSIS — J302 Other seasonal allergic rhinitis: Secondary | ICD-10-CM | POA: Diagnosis not present

## 2022-06-12 DIAGNOSIS — Z87892 Personal history of anaphylaxis: Secondary | ICD-10-CM | POA: Diagnosis not present

## 2022-06-16 DIAGNOSIS — F4310 Post-traumatic stress disorder, unspecified: Secondary | ICD-10-CM | POA: Diagnosis not present

## 2022-06-16 DIAGNOSIS — F3181 Bipolar II disorder: Secondary | ICD-10-CM | POA: Diagnosis not present

## 2022-06-23 DIAGNOSIS — M6281 Muscle weakness (generalized): Secondary | ICD-10-CM | POA: Diagnosis not present

## 2022-06-23 DIAGNOSIS — M545 Low back pain, unspecified: Secondary | ICD-10-CM | POA: Diagnosis not present

## 2022-06-23 DIAGNOSIS — G8929 Other chronic pain: Secondary | ICD-10-CM | POA: Diagnosis not present

## 2022-06-30 DIAGNOSIS — G8929 Other chronic pain: Secondary | ICD-10-CM | POA: Diagnosis not present

## 2022-06-30 DIAGNOSIS — M545 Low back pain, unspecified: Secondary | ICD-10-CM | POA: Diagnosis not present

## 2022-06-30 DIAGNOSIS — M6281 Muscle weakness (generalized): Secondary | ICD-10-CM | POA: Diagnosis not present

## 2022-07-21 ENCOUNTER — Telehealth: Payer: Self-pay | Admitting: *Deleted

## 2022-07-21 NOTE — Patient Outreach (Signed)
  Care Coordination   Initial Visit Note   07/21/2022 Name: Stacy Daniel MRN: [id number] DOB: 19-Oct-1970  Stacy Daniel is a 51 y.o. year old female who sees Feldpausch, Chrissie Noa, MD for primary care. I spoke with  Madelynn Done by phone today.  What matters to the patients health and wellness today?  Patient call interested in care coordination services.  She heard about program through her mother.  Confirmed she is eligible, telephone appointment scheduled.     SDOH assessments and interventions completed:  No    Care Coordination Interventions Activated:  No  Care Coordination Interventions:  No, not indicated   Follow up plan: Follow up call scheduled for 11/20    Encounter Outcome:  Pt. Scheduled   Valente David, RN, MSN, [place] Management Care Management Coordinator [phone]

## 2022-07-27 ENCOUNTER — Ambulatory Visit: Payer: Self-pay | Admitting: *Deleted

## 2022-07-27 ENCOUNTER — Encounter: Payer: Self-pay | Admitting: *Deleted

## 2022-07-27 DIAGNOSIS — I10 Essential (primary) hypertension: Secondary | ICD-10-CM

## 2022-07-27 NOTE — Patient Instructions (Signed)
Visit Information  Thank you for taking time to visit with me today. Please don't hesitate to contact me if I can be of assistance to you.  Following are the goals we discussed today:  Listen for calls from pharmacy team and community resource care guide.  Please call the Suicide and Crisis Lifeline: 988 call the [place] National Suicide Prevention Lifeline: 561-126-8032 or TTY: 308-811-3113 TTY (479)259-4152) to talk to a trained counselor call 1-800-273-TALK (toll free, 24 hour hotline) call 911 if you are experiencing a Mental Health or [place] or need someone to talk to.  Patient verbalizes understanding of instructions and care plan provided today and agrees to view in [place]. Active MyChart status and patient understanding of how to access instructions and care plan via MyChart confirmed with patient.     The patient has been provided with contact information for the care management team and has been advised to call with any health related questions or concerns.   Valente David, RN, MSN, [place] Care Management Care Management Coordinator [phone]

## 2022-07-27 NOTE — Patient Outreach (Signed)
  Care Coordination   Initial Visit Note   07/27/2022 Name: Stacy Daniel MRN: [id number] DOB: May 07, 1971  Stacy Daniel is a 51 y.o. year old female who sees Feldpausch, Chrissie Noa, MD for primary care. I spoke with  Madelynn Done by phone today.  What matters to the patients health and wellness today?  Report she is doing well, denies urgent concerns.  Appreciative of assistance if/when needed.      Goals Addressed             This Visit's Progress    COMPLETED: Care Coordination activities - no follow up needed       Care Coordination Interventions: Patient interviewed about adult health maintenance status including  Regular eye checkups Regular Dental Care    Blood Pressure    Hemoglobin A1c    Advised patient to discuss  Colonoscopy    Pneumonia Vaccine Influenza Vaccine COVID vaccination    with primary care provider  SDOH assessment completed Discussed need for yearly AWV, last PCP was 9/26, next follow up in 6 months (March 2024) Discussed weight and diet management in effort to manage chronic health conditions Referral placed to community resource care guide for utility assistance resources Referral placed to pharmacy team for Oxempic availability         SDOH assessments and interventions completed:  Yes  SDOH Interventions Today    Flowsheet Row Most Recent Value  SDOH Interventions   Food Insecurity Interventions Intervention Not Indicated  Housing Interventions Intervention Not Indicated  Transportation Interventions Intervention Not Indicated  Utilities Interventions Other (Comment)  [Care Guide referral for community resources]        Care Coordination Interventions Activated:  Yes  Care Coordination Interventions:  Yes, provided   Follow up plan: No further intervention required.   Encounter Outcome:  Pt. Visit Completed   Valente David, RN, MSN, [place] Care Management Care Management Coordinator [phone]

## 2022-07-29 ENCOUNTER — Telehealth: Payer: Self-pay | Admitting: Pharmacist

## 2022-07-29 ENCOUNTER — Telehealth: Payer: Self-pay | Admitting: *Deleted

## 2022-07-29 NOTE — Progress Notes (Signed)
[facility] ) Quality Team [facility] Quality Pharmacy Team   07/29/2022  Stacy Daniel 04/12/1971 [id number]  Reason for referral: Medication Assistance  Referral source: Provider Referral medication(s): Ozempic Current insurance:HTA  Medication Assistance Findings:  Medication assistance needs identified: Patient was called regarding medication assistance. HIPAA identifiers were obtained.  Patient reported is in the donut hole and has not been able to get Ozempic '2mg'$  injection.  She said her provider put her on the Ozempic 1 mg injection because she could not get Ozempic '2mg'$ .  Ozempic '2mg'$  injections are on low supply everywhere.  An attempt was made to switch patient to a preferred mail order pharmacy for [place] and they were out of Ozempic '2mg'$  as well but had the lower does.  Patient shared that she is currently in the donut hole.  Based on her calculated income   , she will be eligible for full LIS next year and should not fall in the donut hole.  She was encouraged to continue with the Ozempic '1mg'$  until next year and then switch over all her chronic meds to a preferred mail order pharmacy for her insurance ([facility] or [facility]).  Once she switches to full LIS, she can get a 3 month supply of medication for the same copay as a 30 day supply.  Additional medication assistance options reviewed with patient as warranted:  No other options identified  Plan: I will follow up with the patient next year once her new benefits start.  Elayne Guerin, PharmD, [place] Clinical Pharmacist [phone]

## 2022-07-29 NOTE — Progress Notes (Signed)
  Care Coordination   Note   07/29/2022 Name: Stacy Daniel MRN: [id number] DOB: 07-02-1971  Stacy Daniel is a 51 y.o. year old female who sees Feldpausch, Chrissie Noa, MD for primary care. I reached out to [employer] by phone today to offer care coordination services.  Stacy Daniel was given information about Care Coordination services today including:   The Care Coordination services include support from the care team which includes your Nurse Coordinator, Clinical Social Worker, or Pharmacist.  The Care Coordination team is here to help remove barriers to the health concerns and goals most important to you. Care Coordination services are voluntary, and the patient may decline or stop services at any time by request to their care team member.   Care Coordination Consent Status: Patient agreed to services and verbal consent obtained.   Follow up plan:  Telephone appointment with care coordination team member scheduled for:  08/07/2022  Encounter Outcome:  Pt. Scheduled from referral   Julian Hy, [place] Direct Dial: [phone]

## 2022-08-06 DIAGNOSIS — M797 Fibromyalgia: Secondary | ICD-10-CM | POA: Diagnosis not present

## 2022-08-07 ENCOUNTER — Ambulatory Visit: Payer: Self-pay

## 2022-08-07 NOTE — Patient Outreach (Signed)
  Care Coordination   Follow Up Visit Note   08/07/2022 Name: Stacy Daniel MRN: [id number] DOB: [birth year]  Stacy Daniel is a 51 y.o. year old female who sees Feldpausch, Chrissie Noa, MD for primary care. I spoke with  Madelynn Done by phone today.  What matters to the patients health and wellness today?  Resource Education    Goals Addressed             This Visit's Progress    COMPLETED: Care Coordination Activities       Care Coordination Interventions: Discussed patient is concerned she will be unable to afford the cost of living once she loses her mom's income as her mom lives with her and helps with monthly bills Education on DSS low income energy assistance program (LIEAP) Education on [employer] and Managed Medicaid benefits the patient may be eligible for Discussed plan for patient to contact DSS to determine what benefits she may be eligible for Encouraged patient to contact SW as needed         SDOH assessments and interventions completed:  No     Care Coordination Interventions:  Yes, provided   Follow up plan: No further intervention required.   Encounter Outcome:  Pt. Visit Completed   Daneen Schick, BSW, CDP Social Worker, Certified Dementia Practitioner [place] Management  Care Coordination [phone]

## 2022-08-07 NOTE — Patient Instructions (Signed)
Visit Information  Thank you for taking time to visit with me today. Please don't hesitate to contact me if I can be of assistance to you.   Following are the goals we discussed today:   Goals Addressed             This Visit's Progress    COMPLETED: Care Coordination Activities       Care Coordination Interventions: Discussed patient is concerned she will be unable to afford the cost of living once she loses her mom's income as her mom lives with her and helps with monthly bills Education on DSS low income energy assistance program (LIEAP) Education on [employer] and Managed Medicaid benefits the patient may be eligible for Discussed plan for patient to contact DSS to determine what benefits she may be eligible for Encouraged patient to contact SW as needed         If you are experiencing a Mental Health or [place] or need someone to talk to, please call 1-800-273-TALK (toll free, 24 hour hotline)  Patient verbalizes understanding of instructions and care plan provided today and agrees to view in [place]. Active MyChart status and patient understanding of how to access instructions and care plan via MyChart confirmed with patient.     No further follow up required: Please contact me as needed  Daneen Schick, BSW, CDP Social Worker, Certified Dementia Practitioner [place] [phone]

## 2022-08-14 DIAGNOSIS — E039 Hypothyroidism, unspecified: Secondary | ICD-10-CM | POA: Diagnosis not present

## 2022-08-14 DIAGNOSIS — R7989 Other specified abnormal findings of blood chemistry: Secondary | ICD-10-CM | POA: Diagnosis not present

## 2022-08-14 DIAGNOSIS — E119 Type 2 diabetes mellitus without complications: Secondary | ICD-10-CM | POA: Diagnosis not present

## 2022-09-10 DIAGNOSIS — F3181 Bipolar II disorder: Secondary | ICD-10-CM | POA: Diagnosis not present

## 2022-09-10 DIAGNOSIS — F4310 Post-traumatic stress disorder, unspecified: Secondary | ICD-10-CM | POA: Diagnosis not present

## 2022-09-10 DIAGNOSIS — Z79899 Other long term (current) drug therapy: Secondary | ICD-10-CM | POA: Diagnosis not present

## 2022-11-06 DIAGNOSIS — M797 Fibromyalgia: Secondary | ICD-10-CM | POA: Diagnosis not present

## 2022-12-03 DIAGNOSIS — J3089 Other allergic rhinitis: Secondary | ICD-10-CM | POA: Diagnosis not present

## 2022-12-03 DIAGNOSIS — E7800 Pure hypercholesterolemia, unspecified: Secondary | ICD-10-CM | POA: Diagnosis not present

## 2022-12-03 DIAGNOSIS — J4530 Mild persistent asthma, uncomplicated: Secondary | ICD-10-CM | POA: Diagnosis not present

## 2022-12-03 DIAGNOSIS — E559 Vitamin D deficiency, unspecified: Secondary | ICD-10-CM | POA: Diagnosis not present

## 2022-12-03 DIAGNOSIS — E039 Hypothyroidism, unspecified: Secondary | ICD-10-CM | POA: Diagnosis not present

## 2022-12-03 DIAGNOSIS — K219 Gastro-esophageal reflux disease without esophagitis: Secondary | ICD-10-CM | POA: Diagnosis not present

## 2022-12-03 DIAGNOSIS — G43109 Migraine with aura, not intractable, without status migrainosus: Secondary | ICD-10-CM | POA: Diagnosis not present

## 2022-12-03 DIAGNOSIS — N281 Cyst of kidney, acquired: Secondary | ICD-10-CM | POA: Diagnosis not present

## 2022-12-03 DIAGNOSIS — E119 Type 2 diabetes mellitus without complications: Secondary | ICD-10-CM | POA: Diagnosis not present

## 2022-12-03 DIAGNOSIS — K760 Fatty (change of) liver, not elsewhere classified: Secondary | ICD-10-CM | POA: Diagnosis not present

## 2022-12-03 DIAGNOSIS — Z0000 Encounter for general adult medical examination without abnormal findings: Secondary | ICD-10-CM | POA: Diagnosis not present

## 2022-12-03 DIAGNOSIS — F319 Bipolar disorder, unspecified: Secondary | ICD-10-CM | POA: Diagnosis not present

## 2022-12-31 DIAGNOSIS — F4310 Post-traumatic stress disorder, unspecified: Secondary | ICD-10-CM | POA: Diagnosis not present

## 2022-12-31 DIAGNOSIS — F3181 Bipolar II disorder: Secondary | ICD-10-CM | POA: Diagnosis not present

## 2023-01-14 ENCOUNTER — Other Ambulatory Visit
Admission: RE | Admit: 2023-01-14 | Discharge: 2023-01-14 | Disposition: A | Discharge status: Home / Self Care | Payer: PPO | Source: Ambulatory Visit | Attending: Internal Medicine | Admitting: Internal Medicine

## 2023-01-14 ENCOUNTER — Ambulatory Visit: Payer: PPO | Attending: Cardiology | Admitting: Internal Medicine

## 2023-01-14 ENCOUNTER — Encounter: Payer: Self-pay | Admitting: Internal Medicine

## 2023-01-14 VITALS — BP 96/72 | HR 96 | Ht 63.0 in | Wt 210.6 lb

## 2023-01-14 DIAGNOSIS — R42 Dizziness and giddiness: Secondary | ICD-10-CM

## 2023-01-14 DIAGNOSIS — R002 Palpitations: Secondary | ICD-10-CM | POA: Insufficient documentation

## 2023-01-14 DIAGNOSIS — R0602 Shortness of breath: Secondary | ICD-10-CM | POA: Diagnosis not present

## 2023-01-14 DIAGNOSIS — R079 Chest pain, unspecified: Secondary | ICD-10-CM | POA: Insufficient documentation

## 2023-01-14 LAB — CBC
HCT: 45.2 % (ref 36.0–46.0)
Hemoglobin: 14.5 g/dL (ref 12.0–15.0)
MCH: 27.5 pg (ref 26.0–34.0)
MCHC: 32.1 g/dL (ref 30.0–36.0)
MCV: 85.6 fL (ref 80.0–100.0)
Platelets: 283 10*3/uL (ref 150–400)
RBC: 5.28 MIL/uL — ABNORMAL HIGH (ref 3.87–5.11)
RDW: 13.6 % (ref 11.5–15.5)
WBC: 8.1 10*3/uL (ref 4.0–10.5)
nRBC: 0 % (ref 0.0–0.2)

## 2023-01-14 LAB — TSH: TSH: 1.792 u[IU]/mL (ref 0.350–4.500)

## 2023-01-14 NOTE — Patient Instructions (Signed)
Medication Instructions:  Your Physician recommend you continue on your current medication as directed.    *If you need a refill on your cardiac medications before your next appointment, please call your pharmacy*   Lab Work: Your provider would like for you to have following labs drawn: (CBC, TSH).   Please go to the [facility] entrance and check in at the front desk.  You do not need an appointment.  They are open from 7am-6 pm.   If you have labs (blood work) drawn today and your tests are completely normal, you will receive your results only by: MyChart Message (if you have MyChart) OR A paper copy in the mail If you have any lab test that is abnormal or we need to change your treatment, we will call you to review the results.   Testing/Procedures: Your physician has requested that you have an echocardiogram. Echocardiography is a painless test that uses sound waves to create images of your heart. It provides your doctor with information about the size and shape of your heart and how well your heart's chambers and valves are working.   You may receive an ultrasound enhancing agent through an IV if needed to better visualize your heart during the echo. This procedure takes approximately one hour.  There are no restrictions for this procedure.  This will take place at [address] [address], [place] [address]    Follow-Up: At [facility], you and your health needs are our priority.  As part of our continuing mission to provide you with exceptional heart care, we have created designated Provider Care Teams.  These Care Teams include your primary Cardiologist (physician) and Advanced Practice Providers (APPs -  Physician Assistants and Nurse Practitioners) who all work together to provide you with the care you need, when you need it.  We recommend signing up for the patient portal called "MyChart".  Sign up information is provided on this  After Visit Summary.  MyChart is used to connect with patients for Virtual Visits (Telemedicine).  Patients are able to view lab/test results, encounter notes, upcoming appointments, etc.  Non-urgent messages can be sent to your provider as well.   To learn more about what you can do with MyChart, go to [email or web address].    Your next appointment:   4-6 week(s)  Provider:   You may see Yvonne Kendall, MD or one of the following Advanced Practice Providers on your designated Care Team:   Nicolasa Ducking, NP Eula Listen, PA-C Cadence Fransico Michael, PA-C Charlsie Quest, NP

## 2023-01-14 NOTE — Progress Notes (Signed)
New Outpatient Visit Date: 01/14/2023  Referring Provider: Marina Goodell, MD [address] [place],  [place] [address]  Chief Complaint: Chest pain, dizziness, and palpitations  HPI:  Stacy Daniel is a 52 y.o. female who is being seen today for the evaluation of shortness of breath and dizziness at the request of Dr. Maryjane Hurter. She has a history of type 2 diabetes mellitus, rheumatoid arthritis, fibromyalgia, bipolar disorder, migraines, anxiety.  Today, Stacy Daniel has multiple concerns.  She feels dizzy anytime that she stands up after her head is below heart level.  She describes a sensation of feeling off balance with the room spinning.  She also feels like she could pass out.  The sensation lasts for about 5 minutes regardless of if she sits or lies back down.  She has not fallen but sometimes needs to hold onto things.  She has not passed out.  This began about 3 months ago without obvious precipitant.  She notes that she has lost 75 pounds over the last year with Ozempic, though she has gained about 10 pounds back.  She reports staying well-hydrated, typically drinking a gallon of water a day.  She has minimal caffeine consumption.  Ms. Mcmurtry also reports longstanding chest pain that she describes as a tightness in the center of her chest.  It does not radiate and is not associated with other symptoms.  It most commonly happens when she is resting and improves with activity.  There is some associated shortness of breath.  She also has chronic lower extremity edema, left greater than right, that has been present for years.  She reports negative lower extremity venous duplexes in the past.  On further questioning, she reports having undergone exercise stress testing about 30 years ago.  She notes that the test had to be stopped fairly quickly because her heart rate jumped.  She also had an echocardiogram around that time and believes that it showed mitral valve prolapse.  She was experiencing  similar chest pain and shortness of breath at that time.  --------------------------------------------------------------------------------------------------  Cardiovascular History & Procedures: Cardiovascular Problems: Chest pain Shortness of breath Lightheadedness/dizziness  Risk Factors: Diabetes mellitus and obesity  Cath/PCI: None  CV Surgery: None  EP Procedures and Devices: None  Non-Invasive Evaluation(s): None available (patient reports echo and exercise tolerance test 30 years ago, reportedly showing mitral valve prolapse and exaggerated heart rate response to exercise)  Recent CV Pertinent Labs: Lab Results  Component Value Date   INR 1.00 02/14/2016   K 3.7 02/14/2016   BUN 5 (L) 02/14/2016   CREATININE 0.57 02/14/2016    --------------------------------------------------------------------------------------------------  Past Medical History:  Diagnosis Date   Angioedema    Anxiety    Bipolar 1 disorder (HCC)    Borderline personality disorder (HCC)    Breast pain in female    chronic, right   Chronic bronchitis (HCC)    COPD (chronic obstructive pulmonary disease) (HCC)    Diabetes mellitus without complication (HCC)    Fibromyalgia    Hypoglycemia    IC (interstitial cystitis)    OCD (obsessive compulsive disorder)    Osteoarthritis    Osteoarthritis    PCOS (polycystic ovarian syndrome)    Post traumatic stress disorder    Rheumatoid arthritis (HCC)    Rheumatoid arthritis (HCC)    Schizoaffective disorder, bipolar type (HCC)    Shingles (herpes zoster) polyneuropathy     Past Surgical History:  Procedure Laterality Date   ABDOMINAL HYSTERECTOMY  06/1997  with left ovarectomy   COLONOSCOPY N/A 10/21/2021   Procedure: COLONOSCOPY;  Surgeon: Regis Bill, MD;  Location: [facility] ENDOSCOPY;  Service: Endoscopy;  Laterality: N/A;  DM   GANGLION CYST EXCISION  11/2010   right wrist   OOPHORECTOMY Right 1995   WISDOM TOOTH EXTRACTION   1988    Current Meds  Medication Sig   AJOVY 225 MG/1.5ML SOSY    albuterol (VENTOLIN HFA) 108 (90 Base) MCG/ACT inhaler ProAir HFA   B Complex Vitamins (VITAMIN B COMPLEX PO) Take 1 tablet by mouth daily.   BD PEN NEEDLE NANO U/F 32G X 4 MM MISC    Blood Glucose Monitoring Suppl (ONE TOUCH ULTRA 2) w/Device KIT BY XX ROUTE AS DIRECTED   cholecalciferol (VITAMIN D) 1000 units tablet Take 1,000 Units by mouth 2 (two) times daily.   diphenhydrAMINE (BENADRYL) 25 mg capsule Benadryl   EPINEPHrine 0.3 mg/0.3 mL IJ SOAJ injection EpiPen 2-Pak   estradiol (ESTRACE) 1 MG tablet Take 1 tablet (1 mg total) by mouth daily.   gabapentin (NEURONTIN) 300 MG capsule Take 300 mg by mouth 4 (four) times daily.   levalbuterol (XOPENEX HFA) 45 MCG/ACT inhaler Xopenex HFA   levocetirizine (XYZAL) 5 MG tablet Take 5 mg by mouth every evening.   levothyroxine (SYNTHROID, LEVOTHROID) 75 MCG tablet Take 75 mcg by mouth at bedtime.   LORazepam (ATIVAN) 1 MG tablet Take 1 mg by mouth 2 (two) times daily. Take morning and night.   metFORMIN (GLUCOPHAGE) 500 MG tablet Take 500 mg by mouth daily with breakfast.   montelukast (SINGULAIR) 10 MG tablet Take 10 mg by mouth at bedtime.   Multiple Vitamins-Minerals (MULTIVITAMIN ADULT EXTRA C PO)    Multiple Vitamins-Minerals (PRESERVISION AREDS 2 PO) Take 1 tablet by mouth in the morning and at bedtime.   omeprazole (PRILOSEC) 20 MG capsule Take 20 mg by mouth daily. At night.   ONE TOUCH ULTRA TEST test strip USE TO CHECK BLOOD SUGARS 2 TO 3 TIMES A DAY UTD FOR 90 DAYS   OneTouch Delica Lancets 33G MISC Apply 1 each topically 2 (two) times daily.   OZEMPIC, 1 MG/DOSE, 4 MG/3ML SOPN Inject 2 mLs into the skin every 7 (seven) days.   RELPAX 40 MG tablet TK 1 T PO AT ONSET MAY REPEAT Q 2 H  NOT TO EXCEED 5 TS  D   rizatriptan (MAXALT) 10 MG tablet TK 1 T PO AOS OF HEADACHE. MAY REPEAT IN 2 H IF NO IMPROVEMENT. MAXIMUM OF 30 MG PER DAY   Saccharomyces boulardii (PROBIOTIC)  250 MG CAPS Take by mouth.   Semaglutide, 1 MG/DOSE, (OZEMPIC, 1 MG/DOSE,) 2 MG/1.5ML SOPN Inject 2 mg into the skin once a week.   tiZANidine (ZANAFLEX) 2 MG tablet Take 2 mg by mouth 3 (three) times daily. Take half tablet every 4 hours.   venlafaxine XR (EFFEXOR-XR) 150 MG 24 hr capsule Effexor XR   VRAYLAR 4.5 MG CAPS Take 1 capsule by mouth daily.    Allergies: Albuterol; Aripiprazole; Azelastine; Budesonide; Budesonide-formoterol fumarate; Ciclesonide; Fluticasone; Fluticasone furoate; Fluticasone-salmeterol; Gadolinium derivatives; Hydrocodone-acetaminophen; Iodinated contrast media; Iodine; Latex; Mometasone; Other; Peanut-containing drug products; Penicillins; Petrolatum; Petroleum distillate; Tamsulosin; Tdap [tetanus-diphth-acell pertussis]; Venlafaxine; Zinc oxide; Albuterol sulfate; Antihistamines, chlorpheniramine-type; Cetirizine & related; Diphenhydramine; Duloxetine; Fexofenadine; Gluten meal; Haloperidol and related; Ipratropium-albuterol; Ipratropium-albuterol; Levothyroxine; Levothyroxine sodium; Metformin and related; Milk (cow); Montelukast; Montelukast sodium; Omeprazole; Pineapple; Red dye; Sitagliptin; Ziprasidone hcl; Adhesive [tape]; and Bupropion  Social History   Tobacco Use  Smoking status: Never   Smokeless tobacco: Never  Vaping Use   Vaping Use: Never used  Substance Use Topics   Alcohol use: No    Comment: quit 2011   Drug use: No    Family History  Problem Relation Age of Onset   Stroke Paternal Aunt    Diabetes Father    Obesity Father    Hyperthyroidism Mother    Cancer Sister        skin   Osteoporosis Maternal Grandmother    Hyperthyroidism Maternal Grandmother    Bipolar disorder Maternal Grandmother    Heart attack Maternal Grandfather    Diabetes Paternal Grandmother    Heart attack Paternal Grandfather    Breast cancer Paternal Aunt    Diabetes Paternal Aunt    Breast cancer Cousin    Diabetes Cousin    Bipolar disorder Cousin     Bladder Cancer Neg Hx    Prostate cancer Neg Hx    Kidney cancer Neg Hx    Ovarian cancer Neg Hx    Colon cancer Neg Hx    Heart disease Neg Hx     Review of Systems: A 12-system review of systems was performed and was negative except as noted in the HPI.  --------------------------------------------------------------------------------------------------  Physical Exam: BP 96/72 (BP Location: Left Arm, Patient Position: Sitting, Cuff Size: Normal)   Pulse 96   Ht 5\' 3"  (1.6 m)   Wt 210 lb 9.6 oz (95.5 kg)   SpO2 98%   BMI 37.31 kg/m  Position Blood pressure (mmHg) Heart rate (bpm)  Lying 110/80 102  Sitting 108/80 (dizzy) 102  Standing 96/74 104  Standing (3 minutes) 130/84 104    General: NAD. HEENT: No conjunctival pallor or scleral icterus. Neck: Supple without lymphadenopathy, thyromegaly, JVD, or HJR, though body habitus limits evaluation. No carotid bruit. Lungs: Normal work of breathing. Clear to auscultation bilaterally without wheezes or crackles. Heart: Regular rate and rhythm without murmurs, rubs, or gallops. Non-displaced PMI. Abd: Bowel sounds present. Soft, NT/ND without hepatosplenomegaly Ext: No lower extremity edema. Radial, PT, and DP pulses are 2+ bilaterally Skin: Warm and dry without rash. Neuro: CNIII-XII intact. Strength and fine-touch sensation intact in upper and lower extremities bilaterally. Psych: Normal mood and affect.  EKG: Normal sinus rhythm without abnormalities.  --------------------------------------------------------------------------------------------------  ASSESSMENT AND PLAN: Orthostatic lightheadedness: Ms. Griesemer reports a 47-month history of dizziness when raising her head up after having been bent over.  The quality of her dizziness is unclear, as she reports symptoms of vertigo, lightheadedness, and disequilibrium.  Her orthostatic vital signs today are notable for a borderline drop from sitting to initial standing, though  blood pressure subsequently recovered with continued standing.  Dizziness was reported only when transitioning from lying to sitting without appreciable blood pressure or heart rate changes.  Several of her medications could be contributing to dizziness and elevated heart rates.  I have encouraged her to speak with her primary care and mental health providers to see if dose adjustments are necessary, particularly with her dramatic weight loss over the last year.  We have agreed to obtain an echocardiogram for further evaluation.  If symptoms persist despite unrevealing cardiac workup, consultation with ENT may be helpful.  Chest pain and shortness of breath: Symptoms have been longstanding with echocardiogram and exercise tolerance test having been performed 30 years ago for similar symptoms.  Physical exam and EKG are unrevealing today other than borderline sinus tachycardia.  I recommend checking a TSH and CBC  today.  Recent chemistries were unremarkable.  We will obtain an echocardiogram, as outlined above.  Given atypical nature of the chest pain, I have a low suspicion for ASCVD.  However, if symptoms persist, will need to consider ischemia evaluation.  Ideally, 1 would perform a coronary CTA though patient's anaphylactic reaction to IV contrast makes this suboptimal.  We may need to consider pharmacologic MPI or PET/CT as an alternative.  Follow-up: Return to clinic in 4 to 6 weeks.  Yvonne Kendall, MD 01/14/2023 3:33 PM

## 2023-02-11 DIAGNOSIS — R519 Headache, unspecified: Secondary | ICD-10-CM | POA: Diagnosis not present

## 2023-02-11 DIAGNOSIS — F411 Generalized anxiety disorder: Secondary | ICD-10-CM | POA: Diagnosis not present

## 2023-02-11 DIAGNOSIS — R251 Tremor, unspecified: Secondary | ICD-10-CM | POA: Diagnosis not present

## 2023-02-12 ENCOUNTER — Ambulatory Visit: Payer: PPO | Admitting: Cardiology

## 2023-02-26 DIAGNOSIS — E039 Hypothyroidism, unspecified: Secondary | ICD-10-CM | POA: Diagnosis not present

## 2023-02-26 DIAGNOSIS — E119 Type 2 diabetes mellitus without complications: Secondary | ICD-10-CM | POA: Diagnosis not present

## 2023-03-03 ENCOUNTER — Ambulatory Visit: Payer: PPO | Attending: Internal Medicine

## 2023-03-03 DIAGNOSIS — R079 Chest pain, unspecified: Secondary | ICD-10-CM

## 2023-03-03 DIAGNOSIS — R42 Dizziness and giddiness: Secondary | ICD-10-CM

## 2023-03-03 LAB — ECHOCARDIOGRAM COMPLETE
AR max vel: 2.8 cm2
AV Area VTI: 2.87 cm2
AV Area mean vel: 2.85 cm2
AV Mean grad: 3 mmHg
AV Peak grad: 5.9 mmHg
Ao pk vel: 1.21 m/s
Area-P 1/2: 5.54 cm2
Calc EF: 57 %
S' Lateral: 2.4 cm
Single Plane A2C EF: 54.7 %
Single Plane A4C EF: 58.8 %

## 2023-03-03 NOTE — Progress Notes (Signed)
Cardiology Office Note    Date:  03/05/2023   ID:  Stacy Daniel, DOB 1971/05/23, MRN [id number]  PCP:  Marina Goodell, MD  Cardiologist:  Yvonne Kendall, MD  Electrophysiologist:  None   Chief Complaint: Follow-up  History of Present Illness:   Stacy Daniel is a 52 y.o. female with history of DM2, rheumatoid arthritis, fibromyalgia, bipolar disorder, migraines, anxiety, and multiple medication intolerances who presents for follow-up of echo.  She was evaluated as a new patient by Dr. Okey Dupre on 01/14/2023 at the request of her PCP for evaluation of shortness of breath and dizziness.  At the time of her visit with Dr. Okey Dupre, she had several concerns including an approximate 34-month history of dizziness anytime she stands up after her head is below her heart level, described as a sensation of feeling off balance with the room spinning with associated sensation of near syncope with symptoms lasting for about 5 minutes regardless of if she sat down or lied down.  No frank syncope.  It was noted she had lost approximately 75 pounds over the preceding year with Ozempic, though had gained about 10 pounds back.  She was staying well-hydrated.  She also reported a longstanding history of chest tightness in the center of her chest without radiation with some associated shortness of breath and most commonly occurred while resting and improved with activity.  She also reported chronic lower extremity edema with the left being greater than the right that has been present for years.  She reported prior negative lower extremity venous duplexes in the past.  She reported having undergone stress testing approximately 30 years ago and indicated the test had to be stopped fairly quickly due to elevation in heart rate.  She also believes she had an echocardiogram at that time that may have shown MVP.  EKG in the office showed sinus rhythm without abnormalities.  Orthostatic vital signs in the office for  notable for a borderline drop from sitting to initial standing with subsequent recovery with continued standing.  There was question of some of her medications contributing to her dizziness with recommendation for her to discuss this with her PCP and mental health care provider, particularly in light of her weight loss over the preceding year.  With regards to her chest pain, there was low suspicion for ASCVD with symptoms felt to be atypical in nature with consideration for pharmacologic MPI or PET/CT given the patient's history of anaphylactic reaction to IV contrast.  Echo on 02/21/2023 showed an EF of 55 to 60%, no regional wall motion abnormalities, grade 1 diastolic dysfunction, normal RV systolic function and ventricular cavity size, no significant valvular abnormalities, and normal CVP.  She comes in doing well from a cardiac perspective.  Since monitoring her blood sugars more closely, her dizziness has improved.  Reports was previously having low blood sugar episodes.  Chest discomfort and dyspnea are also somewhat improved.  Chronic lower extremity swelling is stable and dates back several decades.  Wears compression socks daily.  No syncope.  No progressive orthopnea or early satiety.   Labs independently reviewed: 02/2023 - A1c 5.4 01/2023 - Hgb 14.5, PLT 283, TSH normal 11/2022 - potassium 4.8, BUN 6, serum creatinine 0.7, albumin 4.1, AST/ALT normal, TC 185, TG 167, HDL 61, LDL 90  Past Medical History:  Diagnosis Date   Angioedema    Anxiety    Bipolar 1 disorder (HCC)    Borderline personality disorder (HCC)    Breast  pain in female    chronic, right   Chronic bronchitis (HCC)    COPD (chronic obstructive pulmonary disease) (HCC)    Diabetes mellitus without complication (HCC)    Fibromyalgia    Hypoglycemia    IC (interstitial cystitis)    OCD (obsessive compulsive disorder)    Osteoarthritis    Osteoarthritis    PCOS (polycystic ovarian syndrome)    Post traumatic stress  disorder    Rheumatoid arthritis (HCC)    Rheumatoid arthritis (HCC)    Schizoaffective disorder, bipolar type (HCC)    Shingles (herpes zoster) polyneuropathy     Past Surgical History:  Procedure Laterality Date   ABDOMINAL HYSTERECTOMY  06/1997   with left ovarectomy   COLONOSCOPY N/A 10/21/2021   Procedure: COLONOSCOPY;  Surgeon: Regis Bill, MD;  Location: [facility] ENDOSCOPY;  Service: Endoscopy;  Laterality: N/A;  DM   GANGLION CYST EXCISION  11/2010   right wrist   OOPHORECTOMY Right 1995   WISDOM TOOTH EXTRACTION  1988    Current Medications: Current Meds  Medication Sig   AJOVY 225 MG/1.5ML SOSY    albuterol (VENTOLIN HFA) 108 (90 Base) MCG/ACT inhaler ProAir HFA   B Complex Vitamins (VITAMIN B COMPLEX PO) Take 1 tablet by mouth daily.   BD PEN NEEDLE NANO U/F 32G X 4 MM MISC    Blood Glucose Monitoring Suppl (ONE TOUCH ULTRA 2) w/Device KIT BY XX ROUTE AS DIRECTED   cholecalciferol (VITAMIN D) 1000 units tablet Take 1,000 Units by mouth 2 (two) times daily.   Continuous Glucose Sensor (FREESTYLE LIBRE 2 SENSOR) MISC by Does not apply route.   diphenhydrAMINE (BENADRYL) 25 mg capsule Benadryl   EPINEPHrine 0.3 mg/0.3 mL IJ SOAJ injection EpiPen 2-Pak   estradiol (ESTRACE) 1 MG tablet Take 1 tablet (1 mg total) by mouth daily.   gabapentin (NEURONTIN) 300 MG capsule Take 300 mg by mouth 4 (four) times daily.   levalbuterol (XOPENEX HFA) 45 MCG/ACT inhaler Xopenex HFA   levocetirizine (XYZAL) 5 MG tablet Take 5 mg by mouth every evening.   levothyroxine (SYNTHROID, LEVOTHROID) 75 MCG tablet Take 75 mcg by mouth at bedtime.   LORazepam (ATIVAN) 1 MG tablet Take 1 mg by mouth 2 (two) times daily. Take morning and night.   metFORMIN (GLUCOPHAGE) 500 MG tablet Take 500 mg by mouth daily with breakfast.   montelukast (SINGULAIR) 10 MG tablet Take 10 mg by mouth at bedtime.   Multiple Vitamins-Minerals (MULTIVITAMIN ADULT EXTRA C PO)    Multiple Vitamins-Minerals  (PRESERVISION AREDS 2 PO) Take 1 tablet by mouth in the morning and at bedtime.   omeprazole (PRILOSEC) 20 MG capsule Take 20 mg by mouth daily. At night.   ONE TOUCH ULTRA TEST test strip USE TO CHECK BLOOD SUGARS 2 TO 3 TIMES A DAY UTD FOR 90 DAYS   OneTouch Delica Lancets 33G MISC Apply 1 each topically 2 (two) times daily.   RELPAX 40 MG tablet TK 1 T PO AT ONSET MAY REPEAT Q 2 H  NOT TO EXCEED 5 TS  D   rizatriptan (MAXALT) 10 MG tablet TK 1 T PO AOS OF HEADACHE. MAY REPEAT IN 2 H IF NO IMPROVEMENT. MAXIMUM OF 30 MG PER DAY   Saccharomyces boulardii (PROBIOTIC) 250 MG CAPS Take by mouth.   thiothixene (NAVANE) 1 MG capsule Take 2 mg by mouth 2 (two) times daily.   tiZANidine (ZANAFLEX) 2 MG tablet Take 2 mg by mouth 3 (three) times daily. Take half tablet  every 4 hours.   venlafaxine XR (EFFEXOR-XR) 150 MG 24 hr capsule Effexor XR   VRAYLAR 4.5 MG CAPS Take 1 capsule by mouth daily.    Allergies:   Albuterol; Aripiprazole; Azelastine; Budesonide; Budesonide-formoterol fumarate; Ciclesonide; Fluticasone; Fluticasone furoate; Fluticasone-salmeterol; Gadolinium derivatives; Hydrocodone-acetaminophen; Iodinated contrast media; Iodine; Latex; Mometasone; Other; Peanut-containing drug products; Penicillins; Petrolatum; Petroleum distillate; Tamsulosin; Tdap [tetanus-diphth-acell pertussis]; Venlafaxine; Zinc oxide; Albuterol sulfate; Antihistamines, chlorpheniramine-type; Cetirizine & related; Diphenhydramine; Duloxetine; Fexofenadine; Gluten meal; Haloperidol and related; Ipratropium-albuterol; Ipratropium-albuterol; Levothyroxine; Levothyroxine sodium; Metformin and related; Milk (cow); Montelukast; Montelukast sodium; Omeprazole; Pineapple; Red dye; Sitagliptin; Ziprasidone hcl; Adhesive [tape]; and Bupropion   Social History   Socioeconomic History   Marital status: Single    Spouse name: Not on file   Number of children: 0   Years of education: 16   Highest education level: Not on file   Occupational History    Comment: volunteers  Tobacco Use   Smoking status: Never   Smokeless tobacco: Never  Vaping Use   Vaping Use: Never used  Substance and Sexual Activity   Alcohol use: No    Comment: quit 2011   Drug use: No   Sexual activity: Not Currently    Birth control/protection: Surgical  Other Topics Concern   Not on file  Social History Narrative   Lives with parents   Caffeine use- coffee 2 daily, soda 2 daily   Social Determinants of Health   Financial Resource Strain: Not on file  Food Insecurity: No Food Insecurity (07/27/2022)   Hunger Vital Sign    Worried About Running Out of Food in the Last Year: Never true    Ran Out of Food in the Last Year: Never true  Transportation Needs: No Transportation Needs (07/27/2022)   PRAPARE - Administrator, Civil Service (Medical): No    Lack of Transportation (Non-Medical): No  Physical Activity: Sufficiently Active (12/30/2017)   Exercise Vital Sign    Days of Exercise per Week: 3 days    Minutes of Exercise per Session: 60 min  Stress: Not on file  Social Connections: Not on file     Family History:  The patient's family history includes Arrhythmia (age of onset: 73) in her mother; Bipolar disorder in her cousin and maternal grandmother; Breast cancer in her cousin and paternal aunt; Cancer in her sister; Diabetes in her cousin, father, paternal aunt, and paternal grandmother; Heart attack (age of onset: 2) in her maternal grandfather; Hyperthyroidism in her maternal grandmother and mother; Obesity in her father; Osteoporosis in her maternal grandmother; Stroke in her paternal aunt. There is no history of Bladder Cancer, Prostate cancer, Kidney cancer, Ovarian cancer, Colon cancer, or Heart disease.  ROS:   12-point review of systems is negative unless otherwise noted in the HPI.   EKGs/Labs/Other Studies Reviewed:    Studies reviewed were summarized above. The additional studies were reviewed  today:  2D echo 03/03/2023: 1. Left ventricular ejection fraction, by estimation, is 55 to 60%. Left  ventricular ejection fraction by 2D MOD biplane is 57.0 %. The left  ventricle has normal function. The left ventricle has no regional wall  motion abnormalities. Left ventricular  diastolic parameters are consistent with Grade I diastolic dysfunction  (impaired relaxation). The average left ventricular global longitudinal  strain is -17.8 %. The global longitudinal strain is normal.   2. Right ventricular systolic function is normal. The right ventricular  size is normal.   3. The mitral valve is normal in  structure. No evidence of mitral valve  regurgitation.   4. The aortic valve was not well visualized. Aortic valve regurgitation  is not visualized.   5. The inferior vena cava is normal in size with greater than 50%  respiratory variability, suggesting right atrial pressure of 3 mmHg.  __________   EKG:  EKG is not ordered today.    Recent Labs: 01/14/2023: Hemoglobin 14.5; Platelets 283; TSH 1.792  Recent Lipid Panel No results found for: "CHOL", "TRIG", "HDL", "CHOLHDL", "VLDL", "LDLCALC", "LDLDIRECT"  PHYSICAL EXAM:    VS:  BP 106/80 (BP Location: Left Arm, Patient Position: Sitting, Cuff Size: Large)   Pulse (!) 104   Ht 5\' 3"  (1.6 m)   Wt 211 lb 9.6 oz (96 kg)   SpO2 98%   BMI 37.48 kg/m   BMI: Body mass index is 37.48 kg/m.  Physical Exam Vitals reviewed.  Constitutional:      Appearance: She is well-developed.  HENT:     Head: Normocephalic and atraumatic.  Eyes:     General:        Right eye: No discharge.        Left eye: No discharge.  Cardiovascular:     Rate and Rhythm: Normal rate and regular rhythm.     Heart sounds: Normal heart sounds, S1 normal and S2 normal. Heart sounds not distant. No midsystolic click and no opening snap. No murmur heard.    No friction rub.  Pulmonary:     Effort: Pulmonary effort is normal. No respiratory distress.      Breath sounds: Normal breath sounds. No decreased breath sounds, wheezing or rales.  Chest:     Chest wall: No tenderness.  Abdominal:     General: There is no distension.  Musculoskeletal:     Cervical back: Normal range of motion.     Right lower leg: No edema.     Left lower leg: No edema.     Comments: Compression socks noted.   Skin:    General: Skin is warm and dry.     Nails: There is no clubbing.  Neurological:     Mental Status: She is alert and oriented to person, place, and time.  Psychiatric:        Speech: Speech normal.        Behavior: Behavior normal.        Thought Content: Thought content normal.        Judgment: Judgment normal.     Wt Readings from Last 3 Encounters:  03/05/23 211 lb 9.6 oz (96 kg)  01/14/23 210 lb 9.6 oz (95.5 kg)  04/07/22 199 lb (90.3 kg)     ASSESSMENT & PLAN:   Orthostatic lightheadedness: Overall, symptoms have improved with closer monitoring of blood sugars to limit low episodes.  Blood pressure is low normal in the office today, not currently on antihypertensive therapy.  Recommend continued adequate hydration and compression socks.  Echo showed no significant structural abnormalities.  Chest pain with dyspnea: Overall, symptoms are somewhat improved.  Echo showed preserved LV systolic function with normal wall motion and grade 1 diastolic dysfunction.  Schedule Lexiscan MPI to evaluate for high risk ischemia.  Would not pursue coronary CTA given history of anaphylaxis with IV contrast.  With noted symptomatic improvement with dyspnea, would not start standing diuretic in an effort to minimize progression of orthostatic lightheadedness.  Lower extremity swelling: Stable and dates back many years.  Continue with leg elevation and compression socks.  Would  not use a standing diuretic at this time.  Avoid calcium channel blockers.   Informed Consent   Shared Decision Making/Informed Consent{  The risks [chest pain, shortness of  breath, cardiac arrhythmias, dizziness, blood pressure fluctuations, myocardial infarction, stroke/transient ischemic attack, nausea, vomiting, allergic reaction, radiation exposure, metallic taste sensation and life-threatening complications (estimated to be 1 in 10,000)], benefits (risk stratification, diagnosing coronary artery disease, treatment guidance) and alternatives of a nuclear stress test were discussed in detail with Stacy Daniel and she agrees to proceed.        Disposition: F/u with Dr. Okey Dupre or an APP in 2 months.   Medication Adjustments/Labs and Tests Ordered: Current medicines are reviewed at length with the patient today.  Concerns regarding medicines are outlined above. Medication changes, Labs and Tests ordered today are summarized above and listed in the Patient Instructions accessible in Encounters.   SignedEula Listen, PA-C 03/05/2023 10:49 AM     [facility] [place] [address] [place], [place] [address] [phone]

## 2023-03-05 ENCOUNTER — Encounter: Payer: Self-pay | Admitting: Physician Assistant

## 2023-03-05 ENCOUNTER — Ambulatory Visit: Payer: PPO | Attending: Physician Assistant | Admitting: Physician Assistant

## 2023-03-05 VITALS — BP 106/80 | HR 104 | Ht 63.0 in | Wt 211.6 lb

## 2023-03-05 DIAGNOSIS — R079 Chest pain, unspecified: Secondary | ICD-10-CM

## 2023-03-05 DIAGNOSIS — M7989 Other specified soft tissue disorders: Secondary | ICD-10-CM | POA: Diagnosis not present

## 2023-03-05 DIAGNOSIS — R0602 Shortness of breath: Secondary | ICD-10-CM | POA: Diagnosis not present

## 2023-03-05 DIAGNOSIS — R42 Dizziness and giddiness: Secondary | ICD-10-CM | POA: Diagnosis not present

## 2023-03-05 NOTE — Patient Instructions (Signed)
Medication Instructions:  No changes at this time.   *If you need a refill on your cardiac medications before your next appointment, please call your pharmacy*   Lab Work: None  If you have labs (blood work) drawn today and your tests are completely normal, you will receive your results only by: MyChart Message (if you have MyChart) OR A paper copy in the mail If you have any lab test that is abnormal or we need to change your treatment, we will call you to review the results.   Testing/Procedures: [facility] MYOVIEW  Your Provider has ordered a Stress Test with nuclear imaging. The purpose of this test is to evaluate the blood supply to your heart muscle. This procedure is referred to as a "Non-Invasive Stress Test." This is because other than having an IV started in your vein, nothing is inserted or "invades" your body. Cardiac stress tests are done to find areas of poor blood flow to the heart by determining the extent of coronary artery disease (CAD). Some patients exercise on a treadmill, which naturally increases the blood flow to your heart, while others who are unable to walk on a treadmill due to physical limitations have a pharmacologic/chemical stress agent called Lexiscan. This medicine will mimic walking on a treadmill by temporarily increasing your coronary blood flow.     REPORT TO [facility] MEDICAL MALL ENTRANCE  **Proceed to the 1st desk on the right, REGISTRATION, to check in**  Please note: this test may take anywhere between 2-4 hours to complete    Instructions regarding medication:   _XX__:   You may take all of your regular morning medications the day of your test unless listed below.   _XX___:  Hold Metformin the morning of your procedure.      How to prepare for your Myoview test:  Do not eat or drink for 6 hours prior to the test No caffeine for 24 hours prior to the test No smoking 24 hours prior to the test. Ladies, please do not wear dresses.  Skirts or  pants are appropriate. Please wear a short sleeve shirt. No perfume, cologne or lotion. Wear comfortable walking shoes. No heels!   PLEASE NOTIFY THE OFFICE AT LEAST 24 HOURS IN ADVANCE IF YOU ARE UNABLE TO KEEP YOUR APPOINTMENT.  [phone] AND  PLEASE NOTIFY NUCLEAR MEDICINE AT [facility] AT LEAST 24 HOURS IN ADVANCE IF YOU ARE UNABLE TO KEEP YOUR APPOINTMENT. [phone]   Follow-Up: At [facility], you and your health needs are our priority.  As part of our continuing mission to provide you with exceptional heart care, we have created designated Provider Care Teams.  These Care Teams include your primary Cardiologist (physician) and Advanced Practice Providers (APPs -  Physician Assistants and Nurse Practitioners) who all work together to provide you with the care you need, when you need it.   Your next appointment:   2 month(s)  Provider:   Yvonne Kendall, MD or Eula Listen, PA-C

## 2023-03-12 ENCOUNTER — Encounter
Admission: RE | Admit: 2023-03-12 | Discharge: 2023-03-12 | Disposition: A | Discharge status: Home / Self Care | Payer: PPO | Source: Ambulatory Visit | Attending: Physician Assistant | Admitting: Physician Assistant

## 2023-03-12 DIAGNOSIS — R079 Chest pain, unspecified: Secondary | ICD-10-CM | POA: Diagnosis not present

## 2023-03-12 LAB — NM MYOCAR MULTI W/SPECT W/WALL MOTION / EF
Estimated workload: 1
Exercise duration (min): 0 min
Exercise duration (sec): 0 s
LV dias vol: 74 mL (ref 46–106)
LV sys vol: 34 mL
MPHR: 167 {beats}/min
Nuc Stress EF: 54 %
Peak HR: 112 {beats}/min
Percent HR: 67 %
Rest HR: 85 {beats}/min
Rest Nuclear Isotope Dose: 10.8 mCi
SDS: 10
SRS: 3
SSS: 12
ST Depression (mm): 0 mm
Stress Nuclear Isotope Dose: 31.1 mCi
TID: 1.05

## 2023-03-12 MED ORDER — TECHNETIUM TC 99M TETROFOSMIN IV KIT
30.0000 | PACK | Freq: Once | INTRAVENOUS | Status: AC | PRN
  Administered 2023-03-12: 31.12 via INTRAVENOUS

## 2023-03-12 MED ORDER — REGADENOSON 0.4 MG/5ML IV SOLN
0.4000 mg | Freq: Once | INTRAVENOUS | Status: AC
  Administered 2023-03-12: 0.4 mg via INTRAVENOUS

## 2023-03-12 MED ORDER — TECHNETIUM TC 99M TETROFOSMIN IV KIT
10.0000 | PACK | Freq: Once | INTRAVENOUS | Status: AC | PRN
  Administered 2023-03-12: 10.82 via INTRAVENOUS

## 2023-03-19 DIAGNOSIS — M797 Fibromyalgia: Secondary | ICD-10-CM | POA: Diagnosis not present

## 2023-04-06 ENCOUNTER — Other Ambulatory Visit: Payer: Self-pay

## 2023-04-06 ENCOUNTER — Ambulatory Visit: Payer: PPO

## 2023-04-06 ENCOUNTER — Ambulatory Visit
Admission: RE | Admit: 2023-04-06 | Discharge: 2023-04-06 | Disposition: A | Discharge status: Home / Self Care | Payer: PPO | Source: Ambulatory Visit | Attending: Urology | Admitting: Urology

## 2023-04-06 DIAGNOSIS — N281 Cyst of kidney, acquired: Secondary | ICD-10-CM | POA: Insufficient documentation

## 2023-04-06 DIAGNOSIS — N2889 Other specified disorders of kidney and ureter: Secondary | ICD-10-CM | POA: Diagnosis not present

## 2023-04-06 MED ORDER — PREDNISONE 50 MG PO TABS: ORAL_TABLET | ORAL | 0 refills | Status: DC

## 2023-04-06 MED ORDER — DIPHENHYDRAMINE HCL 50 MG PO CAPS: ORAL_CAPSULE | ORAL | 0 refills | Status: AC

## 2023-04-08 DIAGNOSIS — D3502 Benign neoplasm of left adrenal gland: Secondary | ICD-10-CM | POA: Diagnosis not present

## 2023-04-08 DIAGNOSIS — K8020 Calculus of gallbladder without cholecystitis without obstruction: Secondary | ICD-10-CM | POA: Diagnosis not present

## 2023-04-08 DIAGNOSIS — R160 Hepatomegaly, not elsewhere classified: Secondary | ICD-10-CM | POA: Diagnosis not present

## 2023-04-08 DIAGNOSIS — N281 Cyst of kidney, acquired: Secondary | ICD-10-CM | POA: Diagnosis not present

## 2023-04-08 MED ORDER — GADOBUTROL 1 MMOL/ML IV SOLN
9.0000 mL | Freq: Once | INTRAVENOUS | Status: AC | PRN
  Administered 2023-04-08: 9 mL via INTRAVENOUS

## 2023-04-13 ENCOUNTER — Ambulatory Visit: Payer: PPO | Admitting: Urology

## 2023-04-29 DIAGNOSIS — F3181 Bipolar II disorder: Secondary | ICD-10-CM | POA: Diagnosis not present

## 2023-04-30 ENCOUNTER — Ambulatory Visit: Payer: PPO | Admitting: Urology

## 2023-04-30 VITALS — BP 130/84 | HR 108 | Ht 63.0 in | Wt 218.0 lb

## 2023-04-30 DIAGNOSIS — N281 Cyst of kidney, acquired: Secondary | ICD-10-CM

## 2023-04-30 DIAGNOSIS — N3010 Interstitial cystitis (chronic) without hematuria: Secondary | ICD-10-CM

## 2023-04-30 DIAGNOSIS — N2889 Other specified disorders of kidney and ureter: Secondary | ICD-10-CM

## 2023-04-30 NOTE — Progress Notes (Signed)
Marcelle Overlie Plume,acting as a scribe for Vanna Scotland, MD.,have documented all relevant documentation on the behalf of Vanna Scotland, MD,as directed by  Vanna Scotland, MD while in the presence of Vanna Scotland, MD.  04/30/2023 10:46 AM   Durene Cal Christell Faith 1971-07-20 [id number]  Referring provider: Marina Goodell, MD [address] [place],  [place] [address]  Chief Complaint  Patient presents with   Follow-up   Results    HPI: 52 y.o.female with a personal history of interstitial cystitis and cystic lesion on kidney, who presents today for MRI results.    She was diagnosed w/ elevated LFTs by Dr[facility]ando Sn[facility]. She had an extensive workup including a hepatitis panel and elastography.    She underwent a ultrasound abdomen on 08/21/2021 that revealed complex multiloculated cystic lesion of the LEFT kidney which measures 6.6 cm in greatest size, significantly increased since 2011.   She underwent an abdominal MRI on 04/14/2023. It visualized Stable Bosniak classification 41F left renal cysts. Stable 13 mm benign left adrenal adenoma    She denies any flank pain or hematuria.  No urinary symptoms. She has been monitoring her diet to prevent bladder flares and reports no significant bladder issues this year.   PMH: Past Medical History:  Diagnosis Date   Angioedema    Anxiety    Bipolar 1 disorder (HCC)    Borderline personality disorder (HCC)    Breast pain in female    chronic, right   Chronic bronchitis (HCC)    COPD (chronic obstructive pulmonary disease) (HCC)    Diabetes mellitus without complication (HCC)    Fibromyalgia    Hypoglycemia    IC (interstitial cystitis)    OCD (obsessive compulsive disorder)    Osteoarthritis    Osteoarthritis    PCOS (polycystic ovarian syndrome)    Post traumatic stress disorder    Rheumatoid arthritis (HCC)    Rheumatoid arthritis (HCC)    Schizoaffective disorder, bipolar type (HCC)    Shingles (herpes zoster)  polyneuropathy     Surgical History: Past Surgical History:  Procedure Laterality Date   ABDOMINAL HYSTERECTOMY  06/1997   with left ovarectomy   COLONOSCOPY N/A 10/21/2021   Procedure: COLONOSCOPY;  Surgeon: Regis Bill, MD;  Location: [facility];  Service: Endoscopy;  Laterality: N/A;  DM   GANGLION CYST EXCISION  11/2010   right wrist   OOPHORECTOMY Right 1995   WISDOM TOOTH EXTRACTION  1988    Home Medications:  Allergies as of 04/30/2023       Reactions   Albuterol Anaphylaxis   Aripiprazole Anaphylaxis   Azelastine Anaphylaxis   Budesonide Anaphylaxis   Budesonide-formoterol Fumarate Anaphylaxis   Ciclesonide Anaphylaxis   Fluticasone Anaphylaxis   Fluticasone Furoate Anaphylaxis   Fluticasone-salmeterol Anaphylaxis   Gadolinium Derivatives Anaphylaxis   Hydrocodone-acetaminophen Anaphylaxis   Iodinated Contrast Media Anaphylaxis   Other reaction(s): Unknown   Iodine Anaphylaxis   And iodide containing products   Latex Anaphylaxis   Mometasone Anaphylaxis   Other Reaction(s): Angioedema   Other Anaphylaxis   Petrochemical products   Peanut-containing Drug Products Anaphylaxis   Penicillins Anaphylaxis   Petrolatum Anaphylaxis   Yellow   Petroleum Distillate Anaphylaxis, Rash   Other reaction(s): Angioedema (plastics) Other Reaction(s): Angioedema   Tamsulosin Anaphylaxis   Tdap [tetanus-diphth-acell Pertussis] Anaphylaxis   Venlafaxine Other (See Comments)   Migraine, causes eye to close   Zinc Oxide Anaphylaxis   Albuterol Sulfate Other (See Comments)   Gi distress  Antihistamines, Chlorpheniramine-type Other (See Comments)   Gi distress   Cetirizine & Related Other (See Comments)   Gi distress   Diphenhydramine    Other reaction(s): Other (See Comments), Other (See Comments) Doesn't work-can take Benadryl Doesn't work-can take Benadryl   Duloxetine Other (See Comments)   Gi distress   Fexofenadine Other (See Comments)   Gi distress    Gluten Meal    Other reaction(s): Other (See Comments), Other (See Comments) Doesn't digest Doesn't digest   Haloperidol And Related Other (See Comments)   Gi distress   Ipratropium-albuterol Other (See Comments)   Gi distress   Ipratropium-albuterol Other (See Comments)   Levothyroxine Other (See Comments)   Gi distress   Levothyroxine Sodium Nausea And Vomiting   Metformin And Related Nausea And Vomiting   Milk (cow)    Other reaction(s): Angioedema   Montelukast    Other reaction(s): Mental Status Changes (intolerance), Other (See Comments)   Montelukast Sodium Other (See Comments)   Gi distress   Omeprazole Nausea And Vomiting   Other Reaction(s): GI Intolerance Can take Prilosec   Pineapple Other (See Comments)   Other reaction(s): Other (See Comments) unknown unknown   Red Dye #40 (allura Red) Other (See Comments)   Other reaction(s): GI Upset (intolerance), Other (See Comments) migraines Other Reaction(s): GI Intolerance   Sitagliptin Nausea And Vomiting   Ziprasidone Hcl Other (See Comments)   Gi distress   Adhesive [tape] Hives, Rash   Bupropion Anxiety        Medication List        Accurate as of April 30, 2023 10:46 AM. If you have any questions, ask your nurse or doctor.          STOP taking these medications    cyanocobalamin 1000 MCG tablet Commonly known as: VITAMIN B12 Stopped by: Vanna Scotland   ibuprofen 200 MG tablet Commonly known as: ADVIL Stopped by: Vanna Scotland       TAKE these medications    Ajovy 225 MG/1.5ML Sosy Generic drug: Fremanezumab-vfrm   albuterol 108 (90 Base) MCG/ACT inhaler Commonly known as: VENTOLIN HFA ProAir HFA   BD Pen Needle Nano U/F 32G X 4 MM Misc Generic drug: Insulin Pen Needle   cholecalciferol 1000 units tablet Commonly known as: VITAMIN D Take 1,000 Units by mouth 2 (two) times daily.   diphenhydrAMINE 25 mg capsule Commonly known as: BENADRYL Benadryl   diphenhydrAMINE 50 MG  capsule Commonly known as: BENADRYL Take 1 tablet within 1 hour of the scan   EPINEPHrine 0.3 mg/0.3 mL Soaj injection Commonly known as: EPI-PEN EpiPen 2-Pak   estradiol 1 MG tablet Commonly known as: ESTRACE Take 1 tablet (1 mg total) by mouth daily.   FreeStyle Libre 2 Sensor Misc by Does not apply route.   gabapentin 300 MG capsule Commonly known as: NEURONTIN Take 300 mg by mouth 4 (four) times daily.   levalbuterol 45 MCG/ACT inhaler Commonly known as: XOPENEX HFA Xopenex HFA   levocetirizine 5 MG tablet Commonly known as: XYZAL Take 5 mg by mouth every evening.   levothyroxine 75 MCG tablet Commonly known as: SYNTHROID Take 75 mcg by mouth at bedtime.   LORazepam 1 MG tablet Commonly known as: ATIVAN Take 1 mg by mouth 2 (two) times daily. Take morning and night.   metFORMIN 500 MG tablet Commonly known as: GLUCOPHAGE Take 500 mg by mouth daily with breakfast.   montelukast 10 MG tablet Commonly known as: SINGULAIR Take 10 mg by mouth  at bedtime.   MULTIVITAMIN ADULT EXTRA C PO   PRESERVISION AREDS 2 PO Take 1 tablet by mouth in the morning and at bedtime.   omeprazole 20 MG capsule Commonly known as: PRILOSEC Take 20 mg by mouth daily. At night.   ONE TOUCH ULTRA 2 w/Device Kit BY XX ROUTE AS DIRECTED   ONE TOUCH ULTRA TEST test strip Generic drug: glucose blood USE TO CHECK BLOOD SUGARS 2 TO 3 TIMES A DAY UTD FOR 90 DAYS   OneTouch Delica Lancets 33G Misc Apply 1 each topically 2 (two) times daily.   Ozempic (2 MG/DOSE) 8 MG/3ML Sopn Generic drug: Semaglutide (2 MG/DOSE) Inject into the skin. 2 mg a week What changed: Another medication with the same name was removed. Continue taking this medication, and follow the directions you see here. Changed by: Vanna Scotland   predniSONE 50 MG tablet Commonly known as: DELTASONE Take 1 tablet 13 hours prior to scan, then 7 hours prior, and then 1 hour prior to scan   Probiotic 250 MG Caps Take  by mouth.   Relpax 40 MG tablet Generic drug: eletriptan TK 1 T PO AT ONSET MAY REPEAT Q 2 H  NOT TO EXCEED 5 TS  D   rizatriptan 10 MG tablet Commonly known as: MAXALT TK 1 T PO AOS OF HEADACHE. MAY REPEAT IN 2 H IF NO IMPROVEMENT. MAXIMUM OF 30 MG PER DAY   thiothixene 1 MG capsule Commonly known as: NAVANE Take 2 mg by mouth 2 (two) times daily.   tiZANidine 2 MG tablet Commonly known as: ZANAFLEX Take 2 mg by mouth 3 (three) times daily. Take half tablet every 4 hours.   venlafaxine XR 150 MG 24 hr capsule Commonly known as: EFFEXOR-XR Effexor XR   VITAMIN B COMPLEX PO Take 1 tablet by mouth daily.   Vraylar 4.5 MG Caps Generic drug: Cariprazine HCl Take 1 capsule by mouth daily.        Allergies:  Allergies  Allergen Reactions   Albuterol Anaphylaxis   Aripiprazole Anaphylaxis   Azelastine Anaphylaxis   Budesonide Anaphylaxis   Budesonide-Formoterol Fumarate Anaphylaxis   Ciclesonide Anaphylaxis   Fluticasone Anaphylaxis   Fluticasone Furoate Anaphylaxis   Fluticasone-Salmeterol Anaphylaxis   Gadolinium Derivatives Anaphylaxis   Hydrocodone-Acetaminophen Anaphylaxis   Iodinated Contrast Media Anaphylaxis    Other reaction(s): Unknown   Iodine Anaphylaxis    And iodide containing products    Latex Anaphylaxis   Mometasone Anaphylaxis    Other Reaction(s): Angioedema   Other Anaphylaxis    Petrochemical products   Peanut-Containing Drug Products Anaphylaxis   Penicillins Anaphylaxis   Petrolatum Anaphylaxis    Yellow    Petroleum Distillate Anaphylaxis and Rash    Other reaction(s): Angioedema  (plastics)  Other Reaction(s): Angioedema   Tamsulosin Anaphylaxis   Tdap [Tetanus-Diphth-Acell Pertussis] Anaphylaxis   Venlafaxine Other (See Comments)    Migraine, causes eye to close   Zinc Oxide Anaphylaxis   Albuterol Sulfate Other (See Comments)    Gi distress    Antihistamines, Chlorpheniramine-Type Other (See Comments)    Gi distress     Cetirizine & Related Other (See Comments)    Gi distress    Diphenhydramine     Other reaction(s): Other (See Comments), Other (See Comments) Doesn't work-can take Benadryl Doesn't work-can take Benadryl    Duloxetine Other (See Comments)    Gi distress    Fexofenadine Other (See Comments)    Gi distress    Gluten Meal  Other reaction(s): Other (See Comments), Other (See Comments) Doesn't digest Doesn't digest    Haloperidol And Related Other (See Comments)    Gi distress    Ipratropium-Albuterol Other (See Comments)    Gi distress    Ipratropium-Albuterol Other (See Comments)   Levothyroxine Other (See Comments)    Gi distress    Levothyroxine Sodium Nausea And Vomiting   Metformin And Related Nausea And Vomiting   Milk (Cow)     Other reaction(s): Angioedema   Montelukast     Other reaction(s): Mental Status Changes (intolerance), Other (See Comments)   Montelukast Sodium Other (See Comments)    Gi distress    Omeprazole Nausea And Vomiting    Other Reaction(s): GI Intolerance  Can take Prilosec   Pineapple Other (See Comments)    Other reaction(s): Other (See Comments) unknown unknown    Red Dye #40 (Allura Red) Other (See Comments)    Other reaction(s): GI Upset (intolerance), Other (See Comments)  migraines  Other Reaction(s): GI Intolerance   Sitagliptin Nausea And Vomiting   Ziprasidone Hcl Other (See Comments)    Gi distress    Adhesive [Tape] Hives and Rash   Bupropion Anxiety    Family History: Family History  Problem Relation Age of Onset   Hyperthyroidism Mother    Arrhythmia Mother 67       s/p Pacermaker   Diabetes Father    Obesity Father    Cancer Sister        skin   Osteoporosis Maternal Grandmother    Hyperthyroidism Maternal Grandmother    Bipolar disorder Maternal Grandmother    Heart attack Maternal Grandfather 33   Diabetes Paternal Grandmother    Stroke Paternal Aunt    Breast cancer Paternal Aunt    Diabetes  Paternal Aunt    Breast cancer Cousin    Diabetes Cousin    Bipolar disorder Cousin    Bladder Cancer Neg Hx    Prostate cancer Neg Hx    Kidney cancer Neg Hx    Ovarian cancer Neg Hx    Colon cancer Neg Hx    Heart disease Neg Hx     Social History:  reports that she has never smoked. She has never used smokeless tobacco. She reports that she does not drink alcohol and does not use drugs.   Physical Exam: BP 130/84   Pulse (!) 108   Ht 5\' 3"  (1.6 m)   Wt 218 lb (98.9 kg)   BMI 38.62 kg/m   Constitutional:  Alert and oriented, No acute distress. HEENT: [place] AT, moist mucus membranes.  Trachea midline, no masses. Neurologic: Grossly intact, no focal deficits, moving all 4 extremities. Psychiatric: Normal mood and affect.  Pertinent Imaging: EXAM: MRI ABDOMEN WITHOUT AND WITH CONTRAST   TECHNIQUE: Multiplanar multisequence MR imaging of the abdomen was performed both before and after the administration of intravenous contrast.   CONTRAST:  9mL GADAVIST GADOBUTROL 1 MMOL/ML IV SOLN   COMPARISON:  04/01/2022   FINDINGS: Lower chest: Normal heart size without pericardial or pleural effusion. Right hemidiaphragm elevation.   Hepatobiliary: Moderate hepatic steatosis. Mild hepatomegaly at 18.6 cm craniocaudal.   12 mm gallstone without acute cholecystitis or biliary duct dilatation.   Pancreas:  Normal, without mass or ductal dilatation.   Spleen:  Normal in size, without focal abnormality.   Adrenals/Urinary Tract: Normal right adrenal gland. Left adrenal 1. 4 cm nodule demonstrates signal dropout on out of phase imaging, consistent with an adenoma.  Within both kidneys there are simple cysts.   The multi septated interpolar left renal cystic lesion measures 4.1 x 2.8 by 3.5 cm on 22/3 and 9/2. When measured in a similar fashion on the prior, unchanged. No areas of developing nodularity or worrisome postcontrast enhancement.   Lower pole left renal multi  septated cystic lesion measures 7.8 x 7.7 by 5.9 cm on 27/3 and 11/2. When measured in a similar fashion on the prior, 7.8 x 7.7 by 6.1 cm, suggesting stability. Similar morphology, without developing nodularity or suspicious postcontrast enhancement.   No hydronephrosis.   Stomach/Bowel: Normal stomach and small bowel. Colonic stool burden suggests constipation.   Vascular/Lymphatic: Normal caliber of the aorta and branch vessels. Patent renal veins. No retroperitoneal or retrocrural adenopathy.   Other:  No ascites.   Musculoskeletal: No acute osseous abnormality.   IMPRESSION: 1. Similar appearance of the left kidney with inter and lower pole multi septated cystic lesions, considered Bosniak 2 F. Consider ongoing surveillance with pre and post contrast abdominal MRI at 6-12 months. 2. Hepatic steatosis and hepatomegaly 3. Left adrenal adenoma . In the absence of clinically indicated signs/symptoms require(s) no independent follow-up. 4. Cholelithiasis     Electronically Signed   By: Jeronimo Greaves M.D.   On: 04/14/2023 10:22  This was personally reviewed and I agree with the radiologic interpretation.  Assessment & Plan:    1. Left Bosniak 1F Renal cyst  - Overall stable and unchanged over the course of the year - Will continue to follow this annually - Pre-meds will be needed prior to next MRI  2. IC -Symptoms stable, currently on no meds; dietary control only  Return in about 1 year (around 04/29/2024) for repeat MRI abdomen.  I have reviewed the above documentation for accuracy and completeness, and I agree with the above.   Vanna Scotland, MD    [facility] [address], [address] [place], [place] [address] [phone]

## 2023-05-04 ENCOUNTER — Ambulatory Visit: Payer: PPO | Admitting: Urology

## 2023-05-04 NOTE — Progress Notes (Signed)
Cardiology Office Note    Date:  05/07/2023   ID:  [name], [name] 07-25-1971, MRN [id number]  PCP:  Marina Goodell, MD  Cardiologist:  Yvonne Kendall, MD  Electrophysiologist:  None   Chief Complaint: Follow up  History of Present Illness:   Stacy Daniel is a 52 y.o. female with history of DM2, rheumatoid arthritis, fibromyalgia, bipolar disorder, migraines, anxiety, and multiple medication intolerances who presents for follow-up of Lexiscan MPI.   Stacy Daniel was evaluated as a new patient by Dr. Okey Dupre on 01/14/2023 at the request of Stacy Daniel PCP for evaluation of shortness of breath and dizziness.  At the time of Stacy Daniel visit with Dr. Okey Dupre, Stacy Daniel had several concerns including an approximate 71-month history of dizziness anytime Stacy Daniel stood up after Stacy Daniel head is below Stacy Daniel heart level, described as a sensation of feeling off balance with the room spinning with associated sensation of near syncope with symptoms lasting for about 5 minutes regardless of if Stacy Daniel sat down or lied down.  No frank syncope.  It was noted Stacy Daniel had lost approximately 75 pounds over the preceding year with Ozempic, though had gained about 10 pounds back.  Stacy Daniel was staying well-hydrated.  Stacy Daniel also reported a longstanding history of chest tightness in the center of Stacy Daniel chest without radiation with some associated shortness of breath and most commonly occurred while resting and improved with activity.  Stacy Daniel also reported chronic lower extremity edema with the left being greater than the right that has been present for years.  Stacy Daniel reported prior negative lower extremity venous duplexes in the past.  Stacy Daniel reported having undergone stress testing approximately 30 years ago and indicated the test had to be stopped fairly quickly due to elevation in heart rate.  Stacy Daniel also believed Stacy Daniel had an echo at that time that may have shown MVP.  EKG in the office showed sinus rhythm without abnormalities.  Orthostatic vital signs in the office for  notable for a borderline drop from sitting to initial standing with subsequent recovery with continued standing.  There was question of some of Stacy Daniel medications contributing to Stacy Daniel dizziness with recommendation for Stacy Daniel to discuss this with Stacy Daniel PCP and mental health care provider, particularly in light of Stacy Daniel weight loss over the preceding year.  With regards to Stacy Daniel chest pain, there was low suspicion for ASCVD with symptoms felt to be atypical in nature with consideration for pharmacologic MPI or PET/CT given the patient's history of anaphylactic reaction to IV contrast.   Echo on 02/21/2023 showed an EF of 55 to 60%, no regional wall motion abnormalities, grade 1 diastolic dysfunction, normal RV systolic function and ventricular cavity size, no significant valvular abnormalities, and normal CVP.  Stacy Daniel was last seen in the office in 02/2023 noting improvement in Stacy Daniel dizziness following closer monitoring of blood glucose.  Chest discomfort and dyspnea were also somewhat improved.  Chronic lower extremity swelling was stable and dated back several decades.  Stacy Daniel was wearing compression socks daily.  Lexiscan MPI on 03/12/2023 showed no significant ischemia with an EF of 67%.  CT attenuated corrected images showed no coronary calcification or significant aortic atherosclerosis.  Overall, this was a low risk scan.  Stacy Daniel comes in doing well from a cardiac perspective and is without symptoms of angina or cardiac decompensation.  Stacy Daniel continues to note positional dizziness if Stacy Daniel bends over with Stacy Daniel head below Stacy Daniel heart and raises up.  This is about the same as Stacy Daniel was not  at Stacy Daniel last visit, though improved from Stacy Daniel initial visit.  No palpitations, presyncope, or syncope.  Chronic lower extremity swelling is stable.   Labs independently reviewed: 02/2023 - A1c 5.4 01/2023 - Hgb 14.5, PLT 283, TSH normal 11/2022 - potassium 4.8, BUN 6, serum creatinine 0.7, albumin 4.1, AST/ALT normal, TC 185, TG 167, HDL 61, LDL 90  Past  Medical History:  Diagnosis Date   Angioedema    Anxiety    Bipolar 1 disorder (HCC)    Borderline personality disorder (HCC)    Breast pain in female    chronic, right   Chronic bronchitis (HCC)    COPD (chronic obstructive pulmonary disease) (HCC)    Diabetes mellitus without complication (HCC)    Fibromyalgia    Hypoglycemia    IC (interstitial cystitis)    OCD (obsessive compulsive disorder)    Osteoarthritis    Osteoarthritis    PCOS (polycystic ovarian syndrome)    Post traumatic stress disorder    Rheumatoid arthritis (HCC)    Rheumatoid arthritis (HCC)    Schizoaffective disorder, bipolar type (HCC)    Shingles (herpes zoster) polyneuropathy     Past Surgical History:  Procedure Laterality Date   ABDOMINAL HYSTERECTOMY  06/1997   with left ovarectomy   COLONOSCOPY N/A 10/21/2021   Procedure: COLONOSCOPY;  Surgeon: Regis Bill, MD;  Location: [facility] ENDOSCOPY;  Service: Endoscopy;  Laterality: N/A;  DM   GANGLION CYST EXCISION  11/2010   right wrist   OOPHORECTOMY Right 1995   WISDOM TOOTH EXTRACTION  1988    Current Medications: Current Meds  Medication Sig   AJOVY 225 MG/1.5ML SOSY    albuterol (VENTOLIN HFA) 108 (90 Base) MCG/ACT inhaler ProAir HFA   B Complex Vitamins (VITAMIN B COMPLEX PO) Take 1 tablet by mouth daily.   BD PEN NEEDLE NANO U/F 32G X 4 MM MISC    Blood Glucose Monitoring Suppl (ONE TOUCH ULTRA 2) w/Device KIT BY XX ROUTE AS DIRECTED   cholecalciferol (VITAMIN D) 1000 units tablet Take 1,000 Units by mouth 2 (two) times daily.   Continuous Glucose Sensor (FREESTYLE LIBRE 2 SENSOR) MISC by Does not apply route.   diphenhydrAMINE (BENADRYL) 50 MG capsule Take 1 tablet within 1 hour of the scan   EPINEPHrine 0.3 mg/0.3 mL IJ SOAJ injection EpiPen 2-Pak   estradiol (ESTRACE) 1 MG tablet Take 1 tablet (1 mg total) by mouth daily.   gabapentin (NEURONTIN) 300 MG capsule Take 300 mg by mouth 4 (four) times daily.   levalbuterol (XOPENEX HFA)  45 MCG/ACT inhaler Xopenex HFA   levocetirizine (XYZAL) 5 MG tablet Take 5 mg by mouth every evening.   levothyroxine (SYNTHROID, LEVOTHROID) 75 MCG tablet Take 75 mcg by mouth at bedtime.   LORazepam (ATIVAN) 1 MG tablet Take 1 mg by mouth 2 (two) times daily. Take morning and night.   metFORMIN (GLUCOPHAGE) 500 MG tablet Take 500 mg by mouth daily with breakfast.   montelukast (SINGULAIR) 10 MG tablet Take 10 mg by mouth at bedtime.   Multiple Vitamins-Minerals (MULTIVITAMIN ADULT EXTRA C PO)    Multiple Vitamins-Minerals (PRESERVISION AREDS 2 PO) Take 1 tablet by mouth in the morning and at bedtime.   omeprazole (PRILOSEC) 20 MG capsule Take 20 mg by mouth daily. At night.   ONE TOUCH ULTRA TEST test strip USE TO CHECK BLOOD SUGARS 2 TO 3 TIMES A DAY UTD FOR 90 DAYS   OneTouch Delica Lancets 33G MISC Apply 1 each topically 2 (two)  times daily.   OZEMPIC, 2 MG/DOSE, 8 MG/3ML SOPN Inject into the skin. 2 mg a week   RELPAX 40 MG tablet TK 1 T PO AT ONSET MAY REPEAT Q 2 H  NOT TO EXCEED 5 TS  D   rizatriptan (MAXALT) 10 MG tablet TK 1 T PO AOS OF HEADACHE. MAY REPEAT IN 2 H IF NO IMPROVEMENT. MAXIMUM OF 30 MG PER DAY   Saccharomyces boulardii (PROBIOTIC) 250 MG CAPS Take by mouth.   thiothixene (NAVANE) 1 MG capsule Take 2 mg by mouth 2 (two) times daily.   tiZANidine (ZANAFLEX) 2 MG tablet Take 2 mg by mouth 3 (three) times daily. Take half tablet every 4 hours.   venlafaxine XR (EFFEXOR-XR) 150 MG 24 hr capsule Effexor XR   VRAYLAR 4.5 MG CAPS Take 1 capsule by mouth daily.    Allergies:   Albuterol; Aripiprazole; Azelastine; Budesonide; Budesonide-formoterol fumarate; Ciclesonide; Fluticasone; Fluticasone furoate; Fluticasone-salmeterol; Gadolinium derivatives; Hydrocodone-acetaminophen; Iodinated contrast media; Iodine; Latex; Mometasone; Other; Peanut-containing drug products; Penicillins; Petrolatum; Petroleum distillate; Tamsulosin; Tdap [tetanus-diphth-acell pertussis]; Venlafaxine; Zinc  oxide; Albuterol sulfate; Antihistamines, chlorpheniramine-type; Cetirizine & related; Diphenhydramine; Duloxetine; Fexofenadine; Gluten meal; Haloperidol and related; Ipratropium-albuterol; Ipratropium-albuterol; Levothyroxine; Levothyroxine sodium; Metformin and related; Milk (cow); Montelukast; Montelukast sodium; Omeprazole; Pineapple; Red dye #40 (allura red); Sitagliptin; Ziprasidone hcl; Adhesive [tape]; and Bupropion   Social History   Socioeconomic History   Marital status: Single    Spouse name: Not on file   Number of children: 0   Years of education: 16   Highest education level: Not on file  Occupational History    Comment: volunteers  Tobacco Use   Smoking status: Never   Smokeless tobacco: Never  Vaping Use   Vaping status: Never Used  Substance and Sexual Activity   Alcohol use: No    Comment: quit 2011   Drug use: No   Sexual activity: Not Currently    Birth control/protection: Surgical  Other Topics Concern   Not on file  Social History Narrative   Lives with parents   Caffeine use- coffee 2 daily, soda 2 daily   Social Determinants of Health   Financial Resource Strain: Not on file  Food Insecurity: No Food Insecurity (07/27/2022)   Hunger Vital Sign    Worried About Running Out of Food in the Last Year: Never true    Ran Out of Food in the Last Year: Never true  Transportation Needs: No Transportation Needs (07/27/2022)   PRAPARE - Administrator, Civil Service (Medical): No    Lack of Transportation (Non-Medical): No  Physical Activity: Sufficiently Active (12/30/2017)   Exercise Vital Sign    Days of Exercise per Week: 3 days    Minutes of Exercise per Session: 60 min  Stress: Not on file  Social Connections: Not on file     Family History:  The patient's family history includes Arrhythmia (age of onset: 45) in Stacy Daniel mother; Bipolar disorder in Stacy Daniel cousin and maternal grandmother; Breast cancer in Stacy Daniel cousin and paternal aunt; Cancer in  Stacy Daniel sister; Diabetes in Stacy Daniel cousin, father, paternal aunt, and paternal grandmother; Heart attack (age of onset: 107) in Stacy Daniel maternal grandfather; Hyperthyroidism in Stacy Daniel maternal grandmother and mother; Obesity in Stacy Daniel father; Osteoporosis in Stacy Daniel maternal grandmother; Stroke in Stacy Daniel paternal aunt. There is no history of Bladder Cancer, Prostate cancer, Kidney cancer, Ovarian cancer, Colon cancer, or Heart disease.  ROS:   12-point review of systems is negative unless otherwise noted in the HPI.  EKGs/Labs/Other Studies Reviewed:    Studies reviewed were summarized above. The additional studies were reviewed today:  2D echo 03/03/2023: 1. Left ventricular ejection fraction, by estimation, is 55 to 60%. Left  ventricular ejection fraction by 2D MOD biplane is 57.0 %. The left  ventricle has normal function. The left ventricle has no regional wall  motion abnormalities. Left ventricular  diastolic parameters are consistent with Grade I diastolic dysfunction  (impaired relaxation). The average left ventricular global longitudinal  strain is -17.8 %. The global longitudinal strain is normal.   2. Right ventricular systolic function is normal. The right ventricular  size is normal.   3. The mitral valve is normal in structure. No evidence of mitral valve  regurgitation.   4. The aortic valve was not well visualized. Aortic valve regurgitation  is not visualized.   5. The inferior vena cava is normal in size with greater than 50%  respiratory variability, suggesting right atrial pressure of 3 mmHg.  __________  Eugenie Birks MPI 03/12/2023: Pharmacological myocardial perfusion imaging study with no significant  ischemia Normal wall motion, EF estimated at 67% No EKG changes concerning for ischemia at peak stress or in recovery. CT attenuation correction images with no significant aortic atherosclerosis, no coronary calcification Low risk scan   EKG:  EKG is not ordered today.    Recent  Labs: 01/14/2023: Hemoglobin 14.5; Platelets 283; TSH 1.792  Recent Lipid Panel No results found for: "CHOL", "TRIG", "HDL", "CHOLHDL", "VLDL", "LDLCALC", "LDLDIRECT"  PHYSICAL EXAM:    VS:  BP (!) 130/91 (BP Location: Right Arm, Patient Position: Sitting, Cuff Size: Normal) Comment: patient request bp be taking on wrist  Pulse (!) 106   Ht 5\' 3"  (1.6 m)   Wt 217 lb 12.8 oz (98.8 kg)   SpO2 96%   BMI 38.58 kg/m   BMI: Body mass index is 38.58 kg/m.  Physical Exam Vitals reviewed.  Constitutional:      Appearance: Stacy Daniel is well-developed.  HENT:     Head: Normocephalic and atraumatic.  Eyes:     General:        Right eye: No discharge.        Left eye: No discharge.  Neck:     Vascular: No JVD.  Cardiovascular:     Rate and Rhythm: Regular rhythm. Tachycardia present.     Heart sounds: Normal heart sounds, S1 normal and S2 normal. Heart sounds not distant. No midsystolic click and no opening snap. No murmur heard.    No friction rub.  Pulmonary:     Effort: Pulmonary effort is normal. No respiratory distress.     Breath sounds: Normal breath sounds. No decreased breath sounds, wheezing or rales.  Chest:     Chest wall: No tenderness.  Abdominal:     General: There is no distension.  Musculoskeletal:     Cervical back: Normal range of motion.  Skin:    General: Skin is warm and dry.     Nails: There is no clubbing.  Neurological:     Mental Status: Stacy Daniel is alert and oriented to person, place, and time.  Psychiatric:        Speech: Speech normal.        Behavior: Behavior normal.        Thought Content: Thought content normal.        Judgment: Judgment normal.     Wt Readings from Last 3 Encounters:  05/07/23 217 lb 12.8 oz (98.8 kg)  04/30/23 218 lb (  98.9 kg)  03/05/23 211 lb 9.6 oz (96 kg)     ASSESSMENT & PLAN:   Dizziness: Previously noted to be mildly orthostatic in the office.  Query if symptoms are related to bendopnea.  No significant structural  abnormalities noted on echo.  No significant/high risk of CAD noted on stress testing.  No coronary artery calcification or aortic atherosclerosis noted on CT attenuated corrected images.  Placed Zio patch to evaluate for significant arrhythmia, high-grade AV block, or prolonged pauses.  If this is reassuring, no further cardiac testing indicated with recommendation for ongoing heart healthy diet and regular exercise.     Disposition: F/u with Dr. Okey Dupre or an APP in 6 weeks.   Medication Adjustments/Labs and Tests Ordered: Current medicines are reviewed at length with the patient today.  Concerns regarding medicines are outlined above. Medication changes, Labs and Tests ordered today are summarized above and listed in the Patient Instructions accessible in Encounters.   Signed, Eula Listen, PA-C 05/07/2023 4:03 PM     [facility] [place] [address] [place], [place] [address] [phone]

## 2023-05-07 ENCOUNTER — Encounter: Payer: Self-pay | Admitting: Physician Assistant

## 2023-05-07 ENCOUNTER — Ambulatory Visit: Payer: PPO

## 2023-05-07 ENCOUNTER — Ambulatory Visit: Payer: PPO | Attending: Physician Assistant | Admitting: Physician Assistant

## 2023-05-07 VITALS — BP 130/91 | HR 106 | Ht 63.0 in | Wt 217.8 lb

## 2023-05-07 DIAGNOSIS — R42 Dizziness and giddiness: Secondary | ICD-10-CM | POA: Diagnosis not present

## 2023-05-07 NOTE — Patient Instructions (Signed)
Medication Instructions:  No changes at this time.   *If you need a refill on your cardiac medications before your next appointment, please call your pharmacy*   Lab Work: None  If you have labs (blood work) drawn today and your tests are completely normal, you will receive your results only by: MyChart Message (if you have MyChart) OR A paper copy in the mail If you have any lab test that is abnormal or we need to change your treatment, we will call you to review the results.   Testing/Procedures: Your physician has recommended that you wear a Zio monitor.   This monitor is a medical device that records the heart's electrical activity. Doctors most often use these monitors to diagnose arrhythmias. Arrhythmias are problems with the speed or rhythm of the heartbeat. The monitor is a small device applied to your chest. You can wear one while you do your normal daily activities. While wearing this monitor if you have any symptoms to push the button and record what you felt. Once you have worn this monitor for the period of time provider prescribed (Usually 14 days), you will return the monitor device in the postage paid box. Once it is returned they will download the data collected and provide [place] with a report which the provider will then review and we will call you with those results. Important tips:  Avoid showering during the first 24 hours of wearing the monitor. Avoid excessive sweating to help maximize wear time. Do not submerge the device, no hot tubs, and no swimming pools. Keep any lotions or oils away from the patch. After 24 hours you may shower with the patch on. Take brief showers with your back facing the shower head.  Do not remove patch once it has been placed because that will interrupt data and decrease adhesive wear time. Push the button when you have any symptoms and write down what you were feeling. Once you have completed wearing your monitor, remove and place into box  which has postage paid and place in your outgoing mailbox.  If for some reason you have misplaced your box then call our office and we can provide another box and/or mail it off for you.      Follow-Up: At [facility], you and your health needs are our priority.  As part of our continuing mission to provide you with exceptional heart care, we have created designated Provider Care Teams.  These Care Teams include your primary Cardiologist (physician) and Advanced Practice Providers (APPs -  Physician Assistants and Nurse Practitioners) who all work together to provide you with the care you need, when you need it.   Your next appointment:   6 week(s)  Provider:   Yvonne Kendall, MD or Eula Listen, PA-C

## 2023-05-12 DIAGNOSIS — K760 Fatty (change of) liver, not elsewhere classified: Secondary | ICD-10-CM | POA: Diagnosis not present

## 2023-05-12 DIAGNOSIS — K5900 Constipation, unspecified: Secondary | ICD-10-CM | POA: Diagnosis not present

## 2023-05-13 DIAGNOSIS — R42 Dizziness and giddiness: Secondary | ICD-10-CM

## 2023-06-01 DIAGNOSIS — R42 Dizziness and giddiness: Secondary | ICD-10-CM | POA: Diagnosis not present

## 2023-06-17 DIAGNOSIS — K219 Gastro-esophageal reflux disease without esophagitis: Secondary | ICD-10-CM | POA: Diagnosis not present

## 2023-06-17 DIAGNOSIS — J301 Allergic rhinitis due to pollen: Secondary | ICD-10-CM | POA: Diagnosis not present

## 2023-06-17 DIAGNOSIS — Z91010 Allergy to peanuts: Secondary | ICD-10-CM | POA: Diagnosis not present

## 2023-06-17 DIAGNOSIS — Z87892 Personal history of anaphylaxis: Secondary | ICD-10-CM | POA: Diagnosis not present

## 2023-06-17 DIAGNOSIS — J3089 Other allergic rhinitis: Secondary | ICD-10-CM | POA: Diagnosis not present

## 2023-06-17 DIAGNOSIS — J302 Other seasonal allergic rhinitis: Secondary | ICD-10-CM | POA: Diagnosis not present

## 2023-06-17 DIAGNOSIS — J4540 Moderate persistent asthma, uncomplicated: Secondary | ICD-10-CM | POA: Diagnosis not present

## 2023-06-21 DIAGNOSIS — J301 Allergic rhinitis due to pollen: Secondary | ICD-10-CM | POA: Diagnosis not present

## 2023-06-21 DIAGNOSIS — Z91010 Allergy to peanuts: Secondary | ICD-10-CM | POA: Diagnosis not present

## 2023-06-21 DIAGNOSIS — Z91011 Allergy to milk products: Secondary | ICD-10-CM | POA: Diagnosis not present

## 2023-06-21 DIAGNOSIS — T782XXA Anaphylactic shock, unspecified, initial encounter: Secondary | ICD-10-CM | POA: Diagnosis not present

## 2023-06-21 DIAGNOSIS — T7800XA Anaphylactic reaction due to unspecified food, initial encounter: Secondary | ICD-10-CM | POA: Diagnosis not present

## 2023-06-21 DIAGNOSIS — J302 Other seasonal allergic rhinitis: Secondary | ICD-10-CM | POA: Diagnosis not present

## 2023-06-24 NOTE — Progress Notes (Signed)
Cardiology Office Note    Date:  06/25/2023   ID:  Stacy Daniel, DOB 06/19/1971, MRN [id number]  PCP:  Marina Goodell, MD  Cardiologist:  Yvonne Kendall, MD  Electrophysiologist:  None   Chief Complaint: Follow-up  History of Present Illness:   Stacy Daniel is a 52 y.o. female with history of DM2, rheumatoid arthritis, fibromyalgia, bipolar disorder, migraines, anxiety, and multiple medication intolerances who presents for follow-up of Zio patch.   She was evaluated as a new patient by Dr. Okey Dupre on 01/14/2023 at the request of her PCP for evaluation of shortness of breath and dizziness.  At the time of her visit with Dr. Okey Dupre, she had several concerns including an approximate 42-month history of dizziness anytime she stood up after her head is below her heart level, described as a sensation of feeling off balance with the room spinning with associated sensation of near syncope with symptoms lasting for about 5 minutes regardless of if she sat down or lied down.  No frank syncope.  It was noted she had lost approximately 75 pounds over the preceding year with Ozempic, though had gained about 10 pounds back.  She was staying well-hydrated.  She also reported a longstanding history of chest tightness in the center of her chest without radiation with some associated shortness of breath and most commonly occurred while resting and improved with activity.  She also reported chronic lower extremity edema with the left being greater than the right that has been present for years.  She reported prior negative lower extremity venous duplexes in the past.  She reported having undergone stress testing approximately 30 years ago and indicated the test had to be stopped fairly quickly due to elevation in heart rate.  She also believed she had an echo at that time that may have shown MVP.  EKG in the office showed sinus rhythm without abnormalities.  Orthostatic vital signs in the office were  notable for a borderline drop from sitting to initial standing with subsequent recovery with continued standing.  There was question of some of her medications contributing to her dizziness with recommendation for her to discuss this with her PCP and mental health care provider, particularly in light of her weight loss over the preceding year.  With regards to her chest pain, there was low suspicion for ASCVD with symptoms felt to be atypical in nature with consideration for pharmacologic MPI or PET/CT given the patient's history of anaphylactic reaction to IV contrast.   Echo on 02/21/2023 showed an EF of 55 to 60%, no regional wall motion abnormalities, grade 1 diastolic dysfunction, normal RV systolic function and ventricular cavity size, no significant valvular abnormalities, and normal CVP.   She was seen in the office in 02/2023 noting improvement in her dizziness following closer monitoring of blood glucose.  Chest discomfort and dyspnea were also somewhat improved.  Chronic lower extremity swelling was stable and dated back several decades.  She was wearing compression socks daily.  Lexiscan MPI on 03/12/2023 showed no significant ischemia with an EF of 67%.  CT attenuated corrected images showed no coronary calcification or significant aortic atherosclerosis.  Overall, this was a low risk scan.  She was last seen in the office on 05/07/2023 and was stable.  She did continue to note some positional dizziness with bending over and upon raising up.  Subsequent Zio patch showed a predominant rhythm of sinus with an average rate of 95 bpm (range 59 to 158  bpm), rare PACs and PVCs, no sustained arrhythmias or prolonged pauses, and patient triggered events corresponded to primarily sinus rhythm and sinus tachycardia with a few episodes containing isolated PVCs.  She comes in doing well from a cardiac perspective and is without symptoms of angina or cardiac decompensation.  She does continue to have an  occasional palpitation that is short-lived as well as a positional dizziness, particularly if bending over and raising up.  Wearing compression socks with improved lower extremity swelling.  No presyncope or syncope.  Overall feels well.   Labs independently reviewed: 02/2023 - A1c 5.4 01/2023 - Hgb 14.5, PLT 283, TSH normal 11/2022 - potassium 4.8, BUN 6, serum creatinine 0.7, albumin 4.1, AST/ALT normal, TC 185, TG 167, HDL 61, LDL 90  Past Medical History:  Diagnosis Date   Angioedema    Anxiety    Bipolar 1 disorder (HCC)    Borderline personality disorder (HCC)    Breast pain in female    chronic, right   Chronic bronchitis (HCC)    COPD (chronic obstructive pulmonary disease) (HCC)    Diabetes mellitus without complication (HCC)    Fibromyalgia    Hypoglycemia    IC (interstitial cystitis)    OCD (obsessive compulsive disorder)    Osteoarthritis    Osteoarthritis    PCOS (polycystic ovarian syndrome)    Post traumatic stress disorder    Rheumatoid arthritis (HCC)    Rheumatoid arthritis (HCC)    Schizoaffective disorder, bipolar type (HCC)    Shingles (herpes zoster) polyneuropathy     Past Surgical History:  Procedure Laterality Date   ABDOMINAL HYSTERECTOMY  06/1997   with left ovarectomy   COLONOSCOPY N/A 10/21/2021   Procedure: COLONOSCOPY;  Surgeon: Regis Bill, MD;  Location: [facility];  Service: Endoscopy;  Laterality: N/A;  DM   GANGLION CYST EXCISION  11/2010   right wrist   OOPHORECTOMY Right 1995   WISDOM TOOTH EXTRACTION  1988    Current Medications: Current Meds  Medication Sig   AJOVY 225 MG/1.5ML SOSY    albuterol (VENTOLIN HFA) 108 (90 Base) MCG/ACT inhaler ProAir HFA   B Complex Vitamins (VITAMIN B COMPLEX PO) Take 1 tablet by mouth daily.   BD PEN NEEDLE NANO U/F 32G X 4 MM MISC    Blood Glucose Monitoring Suppl (ONE TOUCH ULTRA 2) w/Device KIT BY XX ROUTE AS DIRECTED   cholecalciferol (VITAMIN D) 1000 units tablet Take 1,000 Units  by mouth 2 (two) times daily.   Continuous Glucose Sensor (FREESTYLE LIBRE 2 SENSOR) MISC by Does not apply route.   diphenhydrAMINE (BENADRYL) 50 MG capsule Take 1 tablet within 1 hour of the scan   EPINEPHrine 0.3 mg/0.3 mL IJ SOAJ injection EpiPen 2-Pak   estradiol (ESTRACE) 1 MG tablet Take 1 tablet (1 mg total) by mouth daily.   gabapentin (NEURONTIN) 300 MG capsule Take 300 mg by mouth 4 (four) times daily.   levalbuterol (XOPENEX HFA) 45 MCG/ACT inhaler Xopenex HFA   levocetirizine (XYZAL) 5 MG tablet Take 5 mg by mouth every evening.   levothyroxine (SYNTHROID, LEVOTHROID) 75 MCG tablet Take 75 mcg by mouth at bedtime.   LORazepam (ATIVAN) 1 MG tablet Take 1 mg by mouth 2 (two) times daily. Take morning and night.   metFORMIN (GLUCOPHAGE) 500 MG tablet Take 500 mg by mouth daily with breakfast.   montelukast (SINGULAIR) 10 MG tablet Take 10 mg by mouth at bedtime.   Multiple Vitamins-Minerals (MULTIVITAMIN ADULT EXTRA C PO)  Multiple Vitamins-Minerals (PRESERVISION AREDS 2 PO) Take 1 tablet by mouth in the morning and at bedtime.   omeprazole (PRILOSEC) 20 MG capsule Take 20 mg by mouth daily. At night.   ONE TOUCH ULTRA TEST test strip USE TO CHECK BLOOD SUGARS 2 TO 3 TIMES A DAY UTD FOR 90 DAYS   OneTouch Delica Lancets 33G MISC Apply 1 each topically 2 (two) times daily.   OZEMPIC, 2 MG/DOSE, 8 MG/3ML SOPN Inject into the skin. 2 mg a week   RELPAX 40 MG tablet TK 1 T PO AT ONSET MAY REPEAT Q 2 H  NOT TO EXCEED 5 TS  D   rizatriptan (MAXALT) 10 MG tablet TK 1 T PO AOS OF HEADACHE. MAY REPEAT IN 2 H IF NO IMPROVEMENT. MAXIMUM OF 30 MG PER DAY   Saccharomyces boulardii (PROBIOTIC) 250 MG CAPS Take by mouth.   thiothixene (NAVANE) 1 MG capsule Take 2 mg by mouth 2 (two) times daily.   tiZANidine (ZANAFLEX) 2 MG tablet Take 2 mg by mouth 3 (three) times daily. Take half tablet every 4 hours.   venlafaxine XR (EFFEXOR-XR) 150 MG 24 hr capsule Effexor XR   VRAYLAR 4.5 MG CAPS Take 1  capsule by mouth daily.   [DISCONTINUED] diphenhydrAMINE (BENADRYL) 25 mg capsule prn    Allergies:   Albuterol; Aripiprazole; Azelastine; Budesonide; Budesonide-formoterol fumarate; Ciclesonide; Fluticasone; Fluticasone furoate; Fluticasone-salmeterol; Gadolinium derivatives; Hydrocodone-acetaminophen; Iodinated contrast media; Iodine; Latex; Mometasone; Other; Peanut-containing drug products; Penicillins; Petrolatum; Petroleum distillate; Tamsulosin; Tdap [tetanus-diphth-acell pertussis]; Venlafaxine; Zinc oxide; Albuterol sulfate; Antihistamines, chlorpheniramine-type; Cetirizine & related; Diphenhydramine; Duloxetine; Fexofenadine; Gluten meal; Haloperidol and related; Ipratropium-albuterol; Ipratropium-albuterol; Levothyroxine; Levothyroxine sodium; Metformin and related; Milk (cow); Montelukast; Montelukast sodium; Omeprazole; Pineapple; Red dye #40 (allura red); Sitagliptin; Ziprasidone hcl; Adhesive [tape]; and Bupropion   Social History   Socioeconomic History   Marital status: Single    Spouse name: Not on file   Number of children: 0   Years of education: 16   Highest education level: Not on file  Occupational History    Comment: volunteers  Tobacco Use   Smoking status: Never   Smokeless tobacco: Never  Vaping Use   Vaping status: Never Used  Substance and Sexual Activity   Alcohol use: No    Comment: quit 2011   Drug use: No   Sexual activity: Not Currently    Birth control/protection: Surgical  Other Topics Concern   Not on file  Social History Narrative   Lives with parents   Caffeine use- coffee 2 daily, soda 2 daily   Social Determinants of Health   Financial Resource Strain: Not on file  Food Insecurity: No Food Insecurity (07/27/2022)   Hunger Vital Sign    Worried About Running Out of Food in the Last Year: Never true    Ran Out of Food in the Last Year: Never true  Transportation Needs: No Transportation Needs (07/27/2022)   PRAPARE - Therapist, art (Medical): No    Lack of Transportation (Non-Medical): No  Physical Activity: Sufficiently Active (12/30/2017)   Exercise Vital Sign    Days of Exercise per Week: 3 days    Minutes of Exercise per Session: 60 min  Stress: Not on file  Social Connections: Not on file     Family History:  The patient's family history includes Arrhythmia (age of onset: 59) in her mother; Bipolar disorder in her cousin and maternal grandmother; Breast cancer in her cousin and paternal aunt; Cancer in  her sister; Diabetes in her cousin, father, paternal aunt, and paternal grandmother; Heart attack (age of onset: 57) in her maternal grandfather; Hyperthyroidism in her maternal grandmother and mother; Obesity in her father; Osteoporosis in her maternal grandmother; Stroke in her paternal aunt. There is no history of Bladder Cancer, Prostate cancer, Kidney cancer, Ovarian cancer, Colon cancer, or Heart disease.  ROS:   12-point review of systems is negative unless otherwise noted in the HPI.   EKGs/Labs/Other Studies Reviewed:    Studies reviewed were summarized above. The additional studies were reviewed today:  2D echo 03/03/2023: 1. Left ventricular ejection fraction, by estimation, is 55 to 60%. Left  ventricular ejection fraction by 2D MOD biplane is 57.0 %. The left  ventricle has normal function. The left ventricle has no regional wall  motion abnormalities. Left ventricular  diastolic parameters are consistent with Grade I diastolic dysfunction  (impaired relaxation). The average left ventricular global longitudinal  strain is -17.8 %. The global longitudinal strain is normal.   2. Right ventricular systolic function is normal. The right ventricular  size is normal.   3. The mitral valve is normal in structure. No evidence of mitral valve  regurgitation.   4. The aortic valve was not well visualized. Aortic valve regurgitation  is not visualized.   5. The inferior vena cava is  normal in size with greater than 50%  respiratory variability, suggesting right atrial pressure of 3 mmHg.  __________   Eugenie Birks MPI 03/12/2023: Pharmacological myocardial perfusion imaging study with no significant  ischemia Normal wall motion, EF estimated at 67% No EKG changes concerning for ischemia at peak stress or in recovery. CT attenuation correction images with no significant aortic atherosclerosis, no coronary calcification Low risk scan __________  Eugenie Birks MPI 04/2023:   The patient was monitored for 14 days.   The predominant rhythm was sinus with an average rate of 95 bpm (range 59-158 bpm).   There were rare PACs and PVCs.   No sustained arrhythmia or prolonged pause was observed.   Patient triggered events corresponded primarily to normal sinus rhythm and sinus tachycardia, though a few episodes also contain isolated PVCs.   Predominantly sinus rhythm with rare PACs and PVCs.  No significant arrhythmia observed.    EKG:  EKG is not ordered today.    Recent Labs: 01/14/2023: Hemoglobin 14.5; Platelets 283; TSH 1.792  Recent Lipid Panel No results found for: "CHOL", "TRIG", "HDL", "CHOLHDL", "VLDL", "LDLCALC", "LDLDIRECT"  PHYSICAL EXAM:    VS:  BP 122/86 (BP Location: Left Arm, Patient Position: Sitting, Cuff Size: Normal)   Pulse 92   Ht 5\' 3"  (1.6 m)   Wt 220 lb (99.8 kg)   SpO2 98%   BMI 38.97 kg/m   BMI: Body mass index is 38.97 kg/m.  Physical Exam Vitals reviewed.  Constitutional:      Appearance: She is well-developed.  HENT:     Head: Normocephalic and atraumatic.  Eyes:     General:        Right eye: No discharge.        Left eye: No discharge.  Cardiovascular:     Rate and Rhythm: Normal rate and regular rhythm.     Pulses:          Posterior tibial pulses are 2+ on the right side and 2+ on the left side.     Heart sounds: Normal heart sounds, S1 normal and S2 normal. Heart sounds not distant. No midsystolic click and no opening  snap. No  murmur heard.    No friction rub.  Pulmonary:     Effort: Pulmonary effort is normal. No respiratory distress.     Breath sounds: Normal breath sounds. No decreased breath sounds, wheezing, rhonchi or rales.  Chest:     Chest wall: No tenderness.  Abdominal:     General: There is no distension.  Musculoskeletal:     Cervical back: Normal range of motion.     Right lower leg: No edema.     Left lower leg: No edema.  Skin:    General: Skin is warm and dry.     Nails: There is no clubbing.  Neurological:     Mental Status: She is alert and oriented to person, place, and time.  Psychiatric:        Speech: Speech normal.        Behavior: Behavior normal.        Thought Content: Thought content normal.        Judgment: Judgment normal.     Wt Readings from Last 3 Encounters:  06/25/23 220 lb (99.8 kg)  05/07/23 217 lb 12.8 oz (98.8 kg)  04/30/23 218 lb (98.9 kg)     Orthostatic vital signs: Lying: 104/67, 96 bpm Sitting: 126/86, 100 bpm Standing: 133/88, 104 bpm Standing x 3 minutes: 128/90, 102 bpm  ASSESSMENT & PLAN:   Dizziness: Orthostatic vital signs normal in the office today.  Overall, cardiac workup has been reassuring including echo, Lexiscan MPI, and Zio patch.  Query if symptoms are related to bendopnea.  Recommend slow positional changes and active lifestyle.  No indication for further cardiac testing at this time.     Disposition: F/u with Dr. Okey Dupre or an APP in 6 months.   Medication Adjustments/Labs and Tests Ordered: Current medicines are reviewed at length with the patient today.  Concerns regarding medicines are outlined above. Medication changes, Labs and Tests ordered today are summarized above and listed in the Patient Instructions accessible in Encounters.   Signed, Eula Listen, PA-C 06/25/2023 4:40 PM     [facility] [place] [address] [place], [place] [address] [phone]

## 2023-06-25 ENCOUNTER — Encounter: Payer: Self-pay | Admitting: Physician Assistant

## 2023-06-25 ENCOUNTER — Ambulatory Visit: Payer: PPO | Attending: Physician Assistant | Admitting: Physician Assistant

## 2023-06-25 VITALS — BP 122/86 | HR 92 | Ht 63.0 in | Wt 220.0 lb

## 2023-06-25 DIAGNOSIS — R42 Dizziness and giddiness: Secondary | ICD-10-CM | POA: Diagnosis not present

## 2023-06-25 NOTE — Patient Instructions (Signed)
Medication Instructions:  - Your physician recommends that you continue on your current medications as directed. Please refer to the Current Medication list given to you today.  *If you need a refill on your cardiac medications before your next appointment, please call your pharmacy*   Lab Work: - none ordered  If you have labs (blood work) drawn today and your tests are completely normal, you will receive your results only by: [place] (if you have MyChart) OR A paper copy in the mail If you have any lab test that is abnormal or we need to change your treatment, we will call you to review the results.   Testing/Procedures: - none ordered   Follow-Up: At [facility], you and your health needs are our priority.  As part of our continuing mission to provide you with exceptional heart care, we have created designated Provider Care Teams.  These Care Teams include your primary Cardiologist (physician) and Advanced Practice Providers (APPs -  Physician Assistants and Nurse Practitioners) who all work together to provide you with the care you need, when you need it.  We recommend signing up for the patient portal called "MyChart".  Sign up information is provided on this After Visit Summary.  MyChart is used to connect with patients for Virtual Visits (Telemedicine).  Patients are able to view lab/test results, encounter notes, upcoming appointments, etc.  Non-urgent messages can be sent to your provider as well.   To learn more about what you can do with MyChart, go to [email or web address].    Your next appointment:   6 month(s)  Provider:   You may see Nelva Bush, MD or one of the following Advanced Practice Providers on your designated Care Team:    Christell Faith, PA-C   Other Instructions N/a

## 2023-07-08 DIAGNOSIS — J302 Other seasonal allergic rhinitis: Secondary | ICD-10-CM | POA: Diagnosis not present

## 2023-07-08 DIAGNOSIS — Z91011 Allergy to milk products: Secondary | ICD-10-CM | POA: Diagnosis not present

## 2023-07-08 DIAGNOSIS — J3089 Other allergic rhinitis: Secondary | ICD-10-CM | POA: Diagnosis not present

## 2023-07-08 DIAGNOSIS — Z87892 Personal history of anaphylaxis: Secondary | ICD-10-CM | POA: Diagnosis not present

## 2023-07-08 DIAGNOSIS — Z91010 Allergy to peanuts: Secondary | ICD-10-CM | POA: Diagnosis not present

## 2023-07-08 DIAGNOSIS — J4540 Moderate persistent asthma, uncomplicated: Secondary | ICD-10-CM | POA: Diagnosis not present

## 2023-07-08 DIAGNOSIS — J301 Allergic rhinitis due to pollen: Secondary | ICD-10-CM | POA: Diagnosis not present

## 2023-07-27 DIAGNOSIS — K760 Fatty (change of) liver, not elsewhere classified: Secondary | ICD-10-CM | POA: Diagnosis not present

## 2023-07-27 DIAGNOSIS — J4530 Mild persistent asthma, uncomplicated: Secondary | ICD-10-CM | POA: Diagnosis not present

## 2023-07-27 DIAGNOSIS — Z23 Encounter for immunization: Secondary | ICD-10-CM | POA: Diagnosis not present

## 2023-07-27 DIAGNOSIS — E7800 Pure hypercholesterolemia, unspecified: Secondary | ICD-10-CM | POA: Diagnosis not present

## 2023-07-27 DIAGNOSIS — E559 Vitamin D deficiency, unspecified: Secondary | ICD-10-CM | POA: Diagnosis not present

## 2023-07-27 DIAGNOSIS — E039 Hypothyroidism, unspecified: Secondary | ICD-10-CM | POA: Diagnosis not present

## 2023-07-27 DIAGNOSIS — J3089 Other allergic rhinitis: Secondary | ICD-10-CM | POA: Diagnosis not present

## 2023-07-27 DIAGNOSIS — G43109 Migraine with aura, not intractable, without status migrainosus: Secondary | ICD-10-CM | POA: Diagnosis not present

## 2023-07-27 DIAGNOSIS — N281 Cyst of kidney, acquired: Secondary | ICD-10-CM | POA: Diagnosis not present

## 2023-07-27 DIAGNOSIS — K219 Gastro-esophageal reflux disease without esophagitis: Secondary | ICD-10-CM | POA: Diagnosis not present

## 2023-07-27 DIAGNOSIS — F319 Bipolar disorder, unspecified: Secondary | ICD-10-CM | POA: Diagnosis not present

## 2023-07-27 DIAGNOSIS — E119 Type 2 diabetes mellitus without complications: Secondary | ICD-10-CM | POA: Diagnosis not present

## 2023-07-30 DIAGNOSIS — M25551 Pain in right hip: Secondary | ICD-10-CM | POA: Diagnosis not present

## 2023-07-30 DIAGNOSIS — M797 Fibromyalgia: Secondary | ICD-10-CM | POA: Diagnosis not present

## 2023-07-30 DIAGNOSIS — Z79899 Other long term (current) drug therapy: Secondary | ICD-10-CM | POA: Diagnosis not present

## 2023-08-19 DIAGNOSIS — F319 Bipolar disorder, unspecified: Secondary | ICD-10-CM | POA: Diagnosis not present

## 2023-08-19 DIAGNOSIS — F419 Anxiety disorder, unspecified: Secondary | ICD-10-CM | POA: Diagnosis not present

## 2023-09-03 DIAGNOSIS — E119 Type 2 diabetes mellitus without complications: Secondary | ICD-10-CM | POA: Diagnosis not present

## 2023-09-03 DIAGNOSIS — E039 Hypothyroidism, unspecified: Secondary | ICD-10-CM | POA: Diagnosis not present

## 2023-10-01 DIAGNOSIS — J301 Allergic rhinitis due to pollen: Secondary | ICD-10-CM | POA: Diagnosis not present

## 2023-10-01 DIAGNOSIS — J342 Deviated nasal septum: Secondary | ICD-10-CM | POA: Diagnosis not present

## 2023-10-01 DIAGNOSIS — Z91010 Allergy to peanuts: Secondary | ICD-10-CM | POA: Diagnosis not present

## 2023-10-01 DIAGNOSIS — J302 Other seasonal allergic rhinitis: Secondary | ICD-10-CM | POA: Diagnosis not present

## 2023-10-01 DIAGNOSIS — J4540 Moderate persistent asthma, uncomplicated: Secondary | ICD-10-CM | POA: Diagnosis not present

## 2023-10-01 DIAGNOSIS — K219 Gastro-esophageal reflux disease without esophagitis: Secondary | ICD-10-CM | POA: Diagnosis not present

## 2023-10-01 DIAGNOSIS — J3089 Other allergic rhinitis: Secondary | ICD-10-CM | POA: Diagnosis not present

## 2023-10-01 DIAGNOSIS — E65 Localized adiposity: Secondary | ICD-10-CM | POA: Diagnosis not present

## 2023-10-19 DIAGNOSIS — F411 Generalized anxiety disorder: Secondary | ICD-10-CM | POA: Diagnosis not present

## 2023-10-19 DIAGNOSIS — R519 Headache, unspecified: Secondary | ICD-10-CM | POA: Diagnosis not present

## 2023-10-19 DIAGNOSIS — M6289 Other specified disorders of muscle: Secondary | ICD-10-CM | POA: Diagnosis not present

## 2023-10-19 DIAGNOSIS — G43109 Migraine with aura, not intractable, without status migrainosus: Secondary | ICD-10-CM | POA: Diagnosis not present

## 2023-10-19 DIAGNOSIS — E119 Type 2 diabetes mellitus without complications: Secondary | ICD-10-CM | POA: Diagnosis not present

## 2023-10-19 DIAGNOSIS — R251 Tremor, unspecified: Secondary | ICD-10-CM | POA: Diagnosis not present

## 2023-10-19 DIAGNOSIS — M549 Dorsalgia, unspecified: Secondary | ICD-10-CM | POA: Diagnosis not present

## 2023-10-26 DIAGNOSIS — M150 Primary generalized (osteo)arthritis: Secondary | ICD-10-CM | POA: Diagnosis not present

## 2023-10-26 DIAGNOSIS — M8589 Other specified disorders of bone density and structure, multiple sites: Secondary | ICD-10-CM | POA: Diagnosis not present

## 2023-10-26 DIAGNOSIS — Z79899 Other long term (current) drug therapy: Secondary | ICD-10-CM | POA: Diagnosis not present

## 2023-10-26 DIAGNOSIS — M797 Fibromyalgia: Secondary | ICD-10-CM | POA: Diagnosis not present

## 2023-10-29 DIAGNOSIS — F419 Anxiety disorder, unspecified: Secondary | ICD-10-CM | POA: Diagnosis not present

## 2023-10-29 DIAGNOSIS — F3181 Bipolar II disorder: Secondary | ICD-10-CM | POA: Diagnosis not present

## 2023-12-04 ENCOUNTER — Encounter: Payer: Self-pay | Admitting: *Deleted

## 2023-12-04 ENCOUNTER — Other Ambulatory Visit: Payer: Self-pay

## 2023-12-04 ENCOUNTER — Emergency Department
Admission: EM | Admit: 2023-12-04 | Discharge: 2023-12-04 | Disposition: A | Discharge status: Home / Self Care | Attending: Student in an Organized Health Care Education/Training Program | Admitting: Student in an Organized Health Care Education/Training Program

## 2023-12-04 ENCOUNTER — Emergency Department

## 2023-12-04 DIAGNOSIS — S8001XA Contusion of right knee, initial encounter: Secondary | ICD-10-CM | POA: Insufficient documentation

## 2023-12-04 DIAGNOSIS — E119 Type 2 diabetes mellitus without complications: Secondary | ICD-10-CM | POA: Diagnosis not present

## 2023-12-04 DIAGNOSIS — W19XXXA Unspecified fall, initial encounter: Secondary | ICD-10-CM | POA: Diagnosis not present

## 2023-12-04 DIAGNOSIS — W1830XA Fall on same level, unspecified, initial encounter: Secondary | ICD-10-CM | POA: Diagnosis not present

## 2023-12-04 DIAGNOSIS — M1711 Unilateral primary osteoarthritis, right knee: Secondary | ICD-10-CM | POA: Diagnosis not present

## 2023-12-04 DIAGNOSIS — M25561 Pain in right knee: Secondary | ICD-10-CM | POA: Diagnosis not present

## 2023-12-04 DIAGNOSIS — E039 Hypothyroidism, unspecified: Secondary | ICD-10-CM | POA: Insufficient documentation

## 2023-12-04 DIAGNOSIS — R000 Tachycardia, unspecified: Secondary | ICD-10-CM | POA: Diagnosis not present

## 2023-12-04 MED ORDER — ACETAMINOPHEN 500 MG PO TABS
1000.0000 mg | ORAL_TABLET | Freq: Once | ORAL | Status: AC
  Administered 2023-12-04: 1000 mg via ORAL
  Filled 2023-12-04: qty 2

## 2023-12-04 NOTE — ED Provider Notes (Signed)
 [facility] Provider Note    Event Date/Time   First MD Initiated Contact with Patient 12/04/23 [phone]     (approximate)   History   Fall   HPI  Stacy Daniel is a 53 y.o. female   presents to the ED via EMS after she fell on gravel injuring her right knee.  Patient states she has been unable to bear weight on her right knee because of swelling and increased pain.  Patient denies any previous injury to her knee.  She also denies any head injury or loss of consciousness.  Patient has history of hypothyroidism, nonalcoholic fatty liver disease, type 2 diabetes, osteopenia, fibromyalgia, bipolar, idiopathic angioedema.      Physical Exam   Triage Vital Signs: ED Triage Vitals  Encounter Vitals Group     BP 12/04/23 0930 135/80     Systolic BP Percentile --      Diastolic BP Percentile --      Pulse Rate 12/04/23 0930 (!) 101     Resp 12/04/23 0930 16     Temp 12/04/23 0930 98.2 F (36.8 C)     Temp Source 12/04/23 0930 Oral     SpO2 12/04/23 0930 97 %     Weight 12/04/23 0934 220 lb (99.8 kg)     Height 12/04/23 0934 5\' 2"  (1.575 m)     Head Circumference --      Peak Flow --      Pain Score 12/04/23 0933 5     Pain Loc --      Pain Education --      Exclude from Growth Chart --     Most recent vital signs: Vitals:   12/04/23 0930 12/04/23 0932  BP: 135/80 135/80  Pulse: (!) 101 100  Resp: 16 16  Temp: 98.2 F (36.8 C) 98.2 F (36.8 C)  SpO2: 97% 97%     General: Awake, no distress.  Alert, talkative, answers questions appropriately. CV:  Good peripheral perfusion.  Resp:  Normal effort.  Abd:  No distention.  Other:  On examination of the right knee there is no gross deformity however there is moderate soft tissue edema.  Skin is intact and no discoloration noted at this time.  Moderate tenderness on palpation anteriorly especially of the patella.  No effusion is present.  Motor or sensory function and pulses distally are  intact.  Skin intact.   ED Results / Procedures / Treatments   Labs (all labs ordered are listed, but only abnormal results are displayed) Labs Reviewed - No data to display    RADIOLOGY Right knee x-ray images reviewed interpreted by myself independent of the radiologist with degenerative osteoarthritis noted.  No acute bony abnormality.    PROCEDURES:  Critical Care performed:   Procedures   MEDICATIONS ORDERED IN ED: Medications  acetaminophen (TYLENOL) tablet 1,000 mg (1,000 mg Oral Given 12/04/23 1013)     IMPRESSION / MDM / ASSESSMENT AND PLAN / ED COURSE  I reviewed the triage vital signs and the nursing notes.   Differential diagnosis includes, but is not limited to, contusion right knee, fracture, dislocation, ligament injury, strain, sprain.  53 year old female presents to the ED with complaint of right knee pain after a fall that occurred this morning.  X-rays were reassuring and patient was made aware that she does not have a fracture but does have osteoarthritis.  A knee immobilizer was applied to her knee and patient was given crutches for added  support and protection.  Ice and elevation was discussed.  A prescription for pain medication was offered however patient states that she prefers to stay with Tylenol and ibuprofen as she has multiple allergies listed.  She was instructed to follow-up with her PCP or Dr. Signa Kell who is on-call for orthopedics if continued problems with her knee.      Patient's presentation is most consistent with acute complicated illness / injury requiring diagnostic workup.  FINAL CLINICAL IMPRESSION(S) / ED DIAGNOSES   Final diagnoses:  Contusion of right knee, initial encounter     Rx / DC Orders   ED Discharge Orders     None        Note:  This document was prepared using Dragon voice recognition software and may include unintentional dictation errors.   Tommi Rumps, PA-C 12/04/23 1444    Willy Eddy, MD 12/04/23 [phone]

## 2023-12-04 NOTE — ED Triage Notes (Signed)
 Per EMT report, patient fell on gravel. Patient states she felt a "pop" in the right knee and then fell on right knee. Right knee is noticeably more swollen than left knee. Patient c/o sharp pain from knee and up inner thigh.

## 2023-12-04 NOTE — Discharge Instructions (Signed)
 Follow-up with Dr. Signa Kell who is on-call for orthopedics if any continued problems with your knee are not improving.  Wear the knee immobilizer when up walking.  You do not necessarily have to wear it sleeping and can take this off to shower.  Use your crutches as needed for added support and protection.  Continue taking Tylenol as you have done in the past for your pain control.  Ice and elevation as needed for discomfort.

## 2023-12-14 DIAGNOSIS — S93401A Sprain of unspecified ligament of right ankle, initial encounter: Secondary | ICD-10-CM | POA: Diagnosis not present

## 2023-12-14 DIAGNOSIS — S8001XA Contusion of right knee, initial encounter: Secondary | ICD-10-CM | POA: Diagnosis not present

## 2023-12-22 ENCOUNTER — Encounter: Payer: Self-pay | Admitting: Urology

## 2023-12-23 DIAGNOSIS — E039 Hypothyroidism, unspecified: Secondary | ICD-10-CM | POA: Diagnosis not present

## 2023-12-23 DIAGNOSIS — E7800 Pure hypercholesterolemia, unspecified: Secondary | ICD-10-CM | POA: Diagnosis not present

## 2023-12-23 DIAGNOSIS — Z0000 Encounter for general adult medical examination without abnormal findings: Secondary | ICD-10-CM | POA: Diagnosis not present

## 2023-12-23 DIAGNOSIS — K760 Fatty (change of) liver, not elsewhere classified: Secondary | ICD-10-CM | POA: Diagnosis not present

## 2023-12-23 DIAGNOSIS — K219 Gastro-esophageal reflux disease without esophagitis: Secondary | ICD-10-CM | POA: Diagnosis not present

## 2023-12-23 DIAGNOSIS — J4530 Mild persistent asthma, uncomplicated: Secondary | ICD-10-CM | POA: Diagnosis not present

## 2023-12-23 DIAGNOSIS — N281 Cyst of kidney, acquired: Secondary | ICD-10-CM | POA: Diagnosis not present

## 2023-12-23 DIAGNOSIS — E559 Vitamin D deficiency, unspecified: Secondary | ICD-10-CM | POA: Diagnosis not present

## 2023-12-23 DIAGNOSIS — F319 Bipolar disorder, unspecified: Secondary | ICD-10-CM | POA: Diagnosis not present

## 2023-12-23 DIAGNOSIS — E119 Type 2 diabetes mellitus without complications: Secondary | ICD-10-CM | POA: Diagnosis not present

## 2023-12-23 DIAGNOSIS — G43109 Migraine with aura, not intractable, without status migrainosus: Secondary | ICD-10-CM | POA: Diagnosis not present

## 2023-12-23 DIAGNOSIS — J3089 Other allergic rhinitis: Secondary | ICD-10-CM | POA: Diagnosis not present

## 2023-12-27 DIAGNOSIS — M25561 Pain in right knee: Secondary | ICD-10-CM | POA: Diagnosis not present

## 2024-01-04 DIAGNOSIS — M25561 Pain in right knee: Secondary | ICD-10-CM | POA: Diagnosis not present

## 2024-01-04 DIAGNOSIS — S93401A Sprain of unspecified ligament of right ankle, initial encounter: Secondary | ICD-10-CM | POA: Diagnosis not present

## 2024-01-04 DIAGNOSIS — S8001XA Contusion of right knee, initial encounter: Secondary | ICD-10-CM | POA: Diagnosis not present

## 2024-01-06 ENCOUNTER — Ambulatory Visit: Admitting: Urology

## 2024-01-06 VITALS — BP 119/71 | HR 108 | Ht 65.0 in | Wt 236.0 lb

## 2024-01-06 DIAGNOSIS — N281 Cyst of kidney, acquired: Secondary | ICD-10-CM | POA: Diagnosis not present

## 2024-01-06 DIAGNOSIS — N3010 Interstitial cystitis (chronic) without hematuria: Secondary | ICD-10-CM

## 2024-01-06 NOTE — Progress Notes (Signed)
 Stacy Daniel,acting as a scribe for Dustin Gimenez, MD.,have documented all relevant documentation on the behalf of Dustin Gimenez, MD,as directed by  Dustin Gimenez, MD while in the presence of Dustin Gimenez, MD.  01/06/24 3:25 PM   Stacy Daniel Stacy Daniel 07/21/1971 [id number]  Referring provider: Lorrie Rothman, MD [address] [place],  [place] [address]  Chief Complaint  Patient presents with   Follow-up    HPI:  53 year old female with a personal history of interstitial cystitis presents today for a follow-up visit sooner than expected.   She underwent a ultrasound abdomen on 08/21/2021 that revealed complex multiloculated cystic lesion of the LEFT kidney which measures 6.6 cm in greatest size, significantly increased since 2011.    She underwent an abdominal MRI on 04/14/2023. It visualized Stable Bosniak classification 94F left renal cysts. Stable 13 mm benign left adrenal adenoma with the next MRI scheduled for August 2025.   She reports that her interstitial cystitis was under excellent control during the last visit and continues to be well-managed. She is mindful of her diet, avoiding foods like spaghetti sauce that could trigger symptoms.   She has been through menopause, which she experienced between the ages of 33 and 70. She is aware of the hormonal influences on her condition and is hopeful that she may have outgrown the severe flares.   She is also dealing with a torn meniscus, which will require repair and physical therapy. She is currently using a crutch for mobility.   She moved up her follow-up appointment due to the provider's upcoming departure.   PMH: Past Medical History:  Diagnosis Date   Angioedema    Anxiety    Bipolar 1 disorder (HCC)    Borderline personality disorder (HCC)    Breast pain in female    chronic, right   Chronic bronchitis (HCC)    COPD (chronic obstructive pulmonary disease) (HCC)    Diabetes mellitus without complication  (HCC)    Fibromyalgia    Hypoglycemia    IC (interstitial cystitis)    OCD (obsessive compulsive disorder)    Osteoarthritis    Osteoarthritis    PCOS (polycystic ovarian syndrome)    Post traumatic stress disorder    Rheumatoid arthritis (HCC)    Rheumatoid arthritis (HCC)    Schizoaffective disorder, bipolar type (HCC)    Shingles (herpes zoster) polyneuropathy     Surgical History: Past Surgical History:  Procedure Laterality Date   ABDOMINAL HYSTERECTOMY  06/1997   with left ovarectomy   COLONOSCOPY N/A 10/21/2021   Procedure: COLONOSCOPY;  Surgeon: Shane Darling, MD;  Location: [facility] ENDOSCOPY;  Service: Endoscopy;  Laterality: N/A;  DM   GANGLION CYST EXCISION  11/2010   right wrist   OOPHORECTOMY Right 1995   WISDOM TOOTH EXTRACTION  1988    Home Medications:  Allergies as of 01/06/2024       Reactions   Albuterol Anaphylaxis   Aripiprazole Anaphylaxis   Azelastine Anaphylaxis   Budesonide Anaphylaxis   Budesonide-formoterol Fumarate Anaphylaxis   Ciclesonide Anaphylaxis   Fluticasone Anaphylaxis   Fluticasone Furoate Anaphylaxis   Fluticasone-salmeterol Anaphylaxis   Gadolinium Derivatives Anaphylaxis   Hydrocodone-acetaminophen  Anaphylaxis   Iodinated Contrast Media Anaphylaxis   Other reaction(s): Unknown   Iodine Anaphylaxis   And iodide containing products   Latex Anaphylaxis   Mometasone Anaphylaxis   Other Reaction(s): Angioedema   Other Anaphylaxis   Petrochemical products   Peanut-containing Drug Products Anaphylaxis, Swelling   Penicillins  Anaphylaxis   Petrolatum Anaphylaxis   Yellow   Petroleum Distillate Anaphylaxis, Rash   Other reaction(s): Angioedema (plastics) Other Reaction(s): Angioedema   Tamsulosin Anaphylaxis   Tdap [tetanus-diphth-acell Pertussis] Anaphylaxis   Venlafaxine Other (See Comments)   Migraine, causes eye to close   Zinc Oxide Anaphylaxis   Albuterol Sulfate Other (See Comments)   Gi distress    Antihistamines, Chlorpheniramine-type Other (See Comments)   Gi distress   Cetirizine & Related Other (See Comments)   Gi distress   Diphenhydramine     Other reaction(s): Other (See Comments), Other (See Comments) Doesn't work-can take Benadryl  Doesn't work-can take Benadryl    Duloxetine Other (See Comments)   Gi distress   Fexofenadine Other (See Comments)   Gi distress   Gluten Meal    Other reaction(s): Other (See Comments), Other (See Comments) Doesn't digest Doesn't digest   Haloperidol And Related Other (See Comments)   Gi distress   Ipratropium-albuterol Other (See Comments)   Gi distress   Ipratropium-albuterol Other (See Comments)   Levothyroxine Other (See Comments)   Gi distress   Levothyroxine Sodium Nausea And Vomiting   Metformin And Related Nausea And Vomiting   Milk (cow)    Other reaction(s): Angioedema   Montelukast    Other reaction(s): Mental Status Changes (intolerance), Other (See Comments)   Montelukast Sodium Other (See Comments)   Gi distress   Omeprazole Nausea And Vomiting   Other Reaction(s): GI Intolerance Can take Prilosec   Pineapple Other (See Comments)   Other reaction(s): Other (See Comments) unknown unknown   Red Dye #40 (allura Red) Other (See Comments)   Other reaction(s): GI Upset (intolerance), Other (See Comments) migraines Other Reaction(s): GI Intolerance   Sitagliptin Nausea And Vomiting   Ziprasidone Hcl Other (See Comments)   Gi distress   Adhesive [tape] Hives, Rash   Bupropion Anxiety        Medication List        Accurate as of Jan 06, 2024  3:25 PM. If you have any questions, ask your nurse or doctor.          Ajovy 225 MG/1.5ML Sosy Generic drug: Fremanezumab-vfrm   albuterol 108 (90 Base) MCG/ACT inhaler Commonly known as: VENTOLIN HFA ProAir HFA   BD Pen Needle Nano U/F 32G X 4 MM Misc Generic drug: Insulin Pen Needle   cholecalciferol 1000 units tablet Commonly known as: VITAMIN D Take 1,000  Units by mouth 2 (two) times daily.   diphenhydrAMINE  50 MG capsule Commonly known as: BENADRYL  Take 1 tablet within 1 hour of the scan   EPINEPHrine  0.3 mg/0.3 mL Soaj injection Commonly known as: EPI-PEN EpiPen  2-Pak   estradiol  1 MG tablet Commonly known as: ESTRACE  Take 1 tablet (1 mg total) by mouth daily.   FreeStyle Libre 2 Sensor Misc by Does not apply route.   gabapentin 300 MG capsule Commonly known as: NEURONTIN Take 300 mg by mouth 4 (four) times daily.   levalbuterol 45 MCG/ACT inhaler Commonly known as: XOPENEX HFA Xopenex HFA   levocetirizine 5 MG tablet Commonly known as: XYZAL Take 5 mg by mouth every evening.   levothyroxine 75 MCG tablet Commonly known as: SYNTHROID Take 75 mcg by mouth at bedtime.   LORazepam 1 MG tablet Commonly known as: ATIVAN Take 1 mg by mouth 2 (two) times daily. Take morning and night.   metFORMIN 500 MG tablet Commonly known as: GLUCOPHAGE Take 500 mg by mouth daily with breakfast.   montelukast 10 MG tablet Commonly  known as: SINGULAIR Take 10 mg by mouth at bedtime.   MULTIVITAMIN ADULT EXTRA C PO   PRESERVISION AREDS 2 PO Take 1 tablet by mouth in the morning and at bedtime.   omeprazole 20 MG capsule Commonly known as: PRILOSEC Take 20 mg by mouth daily. At night.   ONE TOUCH ULTRA 2 w/Device Kit BY XX ROUTE AS DIRECTED   ONE TOUCH ULTRA TEST test strip Generic drug: glucose blood USE TO CHECK BLOOD SUGARS 2 TO 3 TIMES A DAY UTD FOR 90 DAYS   OneTouch Delica Lancets 33G Misc Apply 1 each topically 2 (two) times daily.   Ozempic (2 MG/DOSE) 8 MG/3ML Sopn Generic drug: Semaglutide (2 MG/DOSE) Inject into the skin. 2 mg a week   Probiotic 250 MG Caps Take by mouth.   Relpax 40 MG tablet Generic drug: eletriptan TK 1 T PO AT ONSET MAY REPEAT Q 2 H  NOT TO EXCEED 5 TS  D   rizatriptan 10 MG tablet Commonly known as: MAXALT TK 1 T PO AOS OF HEADACHE. MAY REPEAT IN 2 H IF NO IMPROVEMENT. MAXIMUM OF  30 MG PER DAY   thiothixene 1 MG capsule Commonly known as: NAVANE Take 2 mg by mouth 2 (two) times daily.   tiZANidine 2 MG tablet Commonly known as: ZANAFLEX Take 2 mg by mouth 3 (three) times daily. Take half tablet every 4 hours.   venlafaxine XR 150 MG 24 hr capsule Commonly known as: EFFEXOR-XR Effexor XR   VITAMIN B COMPLEX PO Take 1 tablet by mouth daily.   Vraylar 4.5 MG Caps Generic drug: Cariprazine HCl Take 1 capsule by mouth daily.        Allergies:  Allergies  Allergen Reactions   Albuterol Anaphylaxis   Aripiprazole Anaphylaxis   Azelastine Anaphylaxis   Budesonide Anaphylaxis   Budesonide-Formoterol Fumarate Anaphylaxis   Ciclesonide Anaphylaxis   Fluticasone Anaphylaxis   Fluticasone Furoate Anaphylaxis   Fluticasone-Salmeterol Anaphylaxis   Gadolinium Derivatives Anaphylaxis   Hydrocodone-Acetaminophen  Anaphylaxis   Iodinated Contrast Media Anaphylaxis    Other reaction(s): Unknown   Iodine Anaphylaxis    And iodide containing products    Latex Anaphylaxis   Mometasone Anaphylaxis    Other Reaction(s): Angioedema   Other Anaphylaxis    Petrochemical products   Peanut-Containing Drug Products Anaphylaxis and Swelling   Penicillins Anaphylaxis   Petrolatum Anaphylaxis    Yellow    Petroleum Distillate Anaphylaxis and Rash    Other reaction(s): Angioedema  (plastics)  Other Reaction(s): Angioedema   Tamsulosin Anaphylaxis   Tdap [Tetanus-Diphth-Acell Pertussis] Anaphylaxis   Venlafaxine Other (See Comments)    Migraine, causes eye to close   Zinc Oxide Anaphylaxis   Albuterol Sulfate Other (See Comments)    Gi distress    Antihistamines, Chlorpheniramine-Type Other (See Comments)    Gi distress    Cetirizine & Related Other (See Comments)    Gi distress    Diphenhydramine      Other reaction(s): Other (See Comments), Other (See Comments) Doesn't work-can take Benadryl  Doesn't work-can take Benadryl     Duloxetine Other (See  Comments)    Gi distress    Fexofenadine Other (See Comments)    Gi distress    Gluten Meal     Other reaction(s): Other (See Comments), Other (See Comments) Doesn't digest Doesn't digest    Haloperidol And Related Other (See Comments)    Gi distress    Ipratropium-Albuterol Other (See Comments)    Gi distress    Ipratropium-Albuterol Other (  See Comments)   Levothyroxine Other (See Comments)    Gi distress    Levothyroxine Sodium Nausea And Vomiting   Metformin And Related Nausea And Vomiting   Milk (Cow)     Other reaction(s): Angioedema   Montelukast     Other reaction(s): Mental Status Changes (intolerance), Other (See Comments)   Montelukast Sodium Other (See Comments)    Gi distress    Omeprazole Nausea And Vomiting    Other Reaction(s): GI Intolerance  Can take Prilosec   Pineapple Other (See Comments)    Other reaction(s): Other (See Comments) unknown unknown    Red Dye #40 (Allura Red) Other (See Comments)    Other reaction(s): GI Upset (intolerance), Other (See Comments)  migraines  Other Reaction(s): GI Intolerance   Sitagliptin Nausea And Vomiting   Ziprasidone Hcl Other (See Comments)    Gi distress    Adhesive [Tape] Hives and Rash   Bupropion Anxiety    Family History: Family History  Problem Relation Age of Onset   Hyperthyroidism Mother    Arrhythmia Mother 47       s/p Pacermaker   Diabetes Father    Obesity Father    Cancer Sister        skin   Osteoporosis Maternal Grandmother    Hyperthyroidism Maternal Grandmother    Bipolar disorder Maternal Grandmother    Heart attack Maternal Grandfather 33   Diabetes Paternal Grandmother    Stroke Paternal Aunt    Breast cancer Paternal Aunt    Diabetes Paternal Aunt    Breast cancer Cousin    Diabetes Cousin    Bipolar disorder Cousin    Bladder Cancer Neg Hx    Prostate cancer Neg Hx    Kidney cancer Neg Hx    Ovarian cancer Neg Hx    Colon cancer Neg Hx    Heart disease Neg Hx      Social History:  reports that she has never smoked. She has never used smokeless tobacco. She reports that she does not drink alcohol and does not use drugs.   Physical Exam: BP 119/71   Pulse (!) 108   Ht 5\' 5"  (1.651 m)   Wt 236 lb (107 kg)   BMI 39.27 kg/m   Constitutional:  Alert and oriented, No acute distress. HEENT: [place] AT, moist mucus membranes.  Trachea midline, no masses. Neurologic: Grossly intact, no focal deficits, moving all 4 extremities. Psychiatric: Normal mood and affect.   Assessment & Plan:    1. Interstitial Cystitis (IC) - Her IC is currently under excellent control.  - She reports no recent problems and is mindful of dietary triggers.  - Continue current management and dietary precautions.  - No changes in treatment are necessary at this time.  2. Bosniak 55F Renal Cyst - She has a stable Bosniak 55F renal cyst. An MRI is scheduled for August 2025 to monitor the cyst.  - No immediate action is required, but continued surveillance is recommended.  Return in about 3 months (around 04/07/2024) for MRI and reassessment of the renal cyst.  I have reviewed the above documentation for accuracy and completeness, and I agree with the above.   Dustin Gimenez, MD   [facility] [address] [place], [place] [address] [phone]

## 2024-01-11 DIAGNOSIS — S93401A Sprain of unspecified ligament of right ankle, initial encounter: Secondary | ICD-10-CM | POA: Diagnosis not present

## 2024-01-11 DIAGNOSIS — M25561 Pain in right knee: Secondary | ICD-10-CM | POA: Diagnosis not present

## 2024-01-11 DIAGNOSIS — S8001XA Contusion of right knee, initial encounter: Secondary | ICD-10-CM | POA: Diagnosis not present

## 2024-01-17 ENCOUNTER — Ambulatory Visit: Admitting: Physician Assistant

## 2024-01-17 NOTE — Progress Notes (Deleted)
 Cardiology Office Note    Date:  01/17/2024   ID:  Fred Jacobsen, DOB 1970-11-24, MRN [id number]  PCP:  Lorrie Rothman, MD  Cardiologist:  Sammy Crisp, MD  Electrophysiologist:  None   Chief Complaint: Follow up  History of Present Illness:   Stacy Daniel is a 53 y.o. female with history of DM2, rheumatoid arthritis, fibromyalgia, bipolar disorder, migraines, anxiety, and multiple medication intolerances who presents for follow-up of ***.    She was evaluated as a new patient by Dr. Nolan Battle on 01/14/2023 at the request of her PCP for evaluation of shortness of breath and dizziness.  At the time of her visit with Dr. Nolan Battle, she had several concerns including an approximate 34-month history of dizziness anytime she stood up after her head is below her heart level, described as a sensation of feeling off balance with the room spinning with associated sensation of near syncope with symptoms lasting for about 5 minutes regardless of if she sat down or lied down.  No frank syncope.  It was noted she had lost approximately 75 pounds over the preceding year with Ozempic, though had gained about 10 pounds back.  She was staying well-hydrated.  She also reported a longstanding history of chest tightness in the center of her chest without radiation with some associated shortness of breath and most commonly occurred while resting and improved with activity.  She also reported chronic lower extremity edema with the left being greater than the right that has been present for years.  She reported prior negative lower extremity venous duplexes in the past.  She reported having undergone stress testing approximately 30 years ago and indicated the test had to be stopped fairly quickly due to elevation in heart rate.  She also believed she had an echo at that time that may have shown MVP.  EKG in the office showed sinus rhythm without abnormalities.  Orthostatic vital signs in the office were notable for  a borderline drop from sitting to initial standing with subsequent recovery with continued standing.  There was question of some of her medications contributing to her dizziness with recommendation for her to discuss this with her PCP and mental health care provider, particularly in light of her weight loss over the preceding year.  With regards to her chest pain, there was low suspicion for ASCVD with symptoms felt to be atypical in nature with consideration for pharmacologic MPI or PET/CT given the patient's history of anaphylactic reaction to IV contrast.   Echo on 02/21/2023 showed an EF of 55 to 60%, no regional wall motion abnormalities, grade 1 diastolic dysfunction, normal RV systolic function and ventricular cavity size, no significant valvular abnormalities, and normal CVP.   She was seen in the office in 02/2023 noting improvement in her dizziness following closer monitoring of blood glucose.  Chest discomfort and dyspnea were also somewhat improved.  Chronic lower extremity swelling was stable and dated back several decades.  She was wearing compression socks daily.  Lexiscan  MPI on 03/12/2023 showed no significant ischemia with an EF of 67%.  CT attenuated corrected images showed no coronary calcification or significant aortic atherosclerosis.  Overall, this was a low risk scan.   She was seen in the office on 05/07/2023 and did continue to note some positional dizziness with bending over and upon raising up.  Subsequent Zio patch showed a predominant rhythm of sinus with an average rate of 95 bpm (range 59 to 158 bpm), rare PACs  and PVCs, no sustained arrhythmias or prolonged pauses, and patient triggered events corresponded to primarily sinus rhythm and sinus tachycardia with a few episodes containing isolated PVCs.  She was last seen in the office in 06/2023 and doing well from a cardiac perspective with occasional palpitations that were short-lived as well as some positional dizziness,  particularly if bending over and raising up.  Lower extremity swelling was improved with compression socks.  ***   Labs independently reviewed: 12/2023 - potassium 4.7, BUN 12, serum creatinine 0.8, albumin 4.4, AST/ALT normal, TC 191, TG 166, HDL 61, LDL 97 08/2023 - A1c 6.1 01/2023 - Hgb 14.5, PLT 283, TSH normal   Past Medical History:  Diagnosis Date   Angioedema    Anxiety    Bipolar 1 disorder (HCC)    Borderline personality disorder (HCC)    Breast pain in female    chronic, right   Chronic bronchitis (HCC)    COPD (chronic obstructive pulmonary disease) (HCC)    Diabetes mellitus without complication (HCC)    Fibromyalgia    Hypoglycemia    IC (interstitial cystitis)    OCD (obsessive compulsive disorder)    Osteoarthritis    Osteoarthritis    PCOS (polycystic ovarian syndrome)    Post traumatic stress disorder    Rheumatoid arthritis (HCC)    Rheumatoid arthritis (HCC)    Schizoaffective disorder, bipolar type (HCC)    Shingles (herpes zoster) polyneuropathy     Past Surgical History:  Procedure Laterality Date   ABDOMINAL HYSTERECTOMY  06/1997   with left ovarectomy   COLONOSCOPY N/A 10/21/2021   Procedure: COLONOSCOPY;  Surgeon: Shane Darling, MD;  Location: [facility] ENDOSCOPY;  Service: Endoscopy;  Laterality: N/A;  DM   GANGLION CYST EXCISION  11/2010   right wrist   OOPHORECTOMY Right 1995   WISDOM TOOTH EXTRACTION  1988    Current Medications: No outpatient medications have been marked as taking for the 01/17/24 encounter (Appointment) with Roark Chick, PA-C.    Allergies:   Albuterol; Aripiprazole; Azelastine; Budesonide; Budesonide-formoterol fumarate; Ciclesonide; Fluticasone; Fluticasone furoate; Fluticasone-salmeterol; Gadolinium derivatives; Hydrocodone-acetaminophen ; Iodinated contrast media; Iodine; Latex; Mometasone; Other; Peanut-containing drug products; Penicillins; Petrolatum; Petroleum distillate; Tamsulosin; Tdap [tetanus-diphth-acell  pertussis]; Venlafaxine; Zinc oxide; Albuterol sulfate; Antihistamines, chlorpheniramine-type; Cetirizine & related; Diphenhydramine ; Duloxetine; Fexofenadine; Gluten meal; Haloperidol and related; Ipratropium-albuterol; Ipratropium-albuterol; Levothyroxine; Levothyroxine sodium; Metformin and related; Milk (cow); Montelukast; Montelukast sodium; Omeprazole; Pineapple; Red dye #40 (allura red); Sitagliptin; Ziprasidone hcl; Adhesive [tape]; and Bupropion   Social History   Socioeconomic History   Marital status: Single    Spouse name: Not on file   Number of children: 0   Years of education: 16   Highest education level: Not on file  Occupational History    Comment: volunteers  Tobacco Use   Smoking status: Never   Smokeless tobacco: Never  Vaping Use   Vaping status: Never Used  Substance and Sexual Activity   Alcohol use: No    Comment: quit 2011   Drug use: No   Sexual activity: Not Currently    Birth control/protection: Surgical  Other Topics Concern   Not on file  Social History Narrative   Lives with parents   Caffeine use- coffee 2 daily, soda 2 daily   Social Drivers of Health   Financial Resource Strain: Low Risk  (12/23/2023)   Received from [facility]   Overall Financial Resource Strain (CARDIA)    Difficulty of Paying Living Expenses: Not hard at all  Food Insecurity: No Food Insecurity (12/23/2023)   Received from [facility]   Hunger Vital Sign    Worried About Running Out of Food in the Last Year: Never true    Ran Out of Food in the Last Year: Never true  Transportation Needs: No Transportation Needs (12/23/2023)   Received from [facility] - Transportation    In the past 12 months, has lack of transportation kept you from medical appointments or from getting medications?: No    Lack of Transportation (Non-Medical): No  Physical Activity: Sufficiently Active (12/30/2017)   Exercise Vital Sign     Days of Exercise per Week: 3 days    Minutes of Exercise per Session: 60 min  Stress: Not on file  Social Connections: Not on file     Family History:  The patient's family history includes Arrhythmia (age of onset: 27) in her mother; Bipolar disorder in her cousin and maternal grandmother; Breast cancer in her cousin and paternal aunt; Cancer in her sister; Diabetes in her cousin, father, paternal aunt, and paternal grandmother; Heart attack (age of onset: 55) in her maternal grandfather; Hyperthyroidism in her maternal grandmother and mother; Obesity in her father; Osteoporosis in her maternal grandmother; Stroke in her paternal aunt. There is no history of Bladder Cancer, Prostate cancer, Kidney cancer, Ovarian cancer, Colon cancer, or Heart disease.  ROS:   12-point review of systems is negative unless otherwise noted in the HPI.   EKGs/Labs/Other Studies Reviewed:    Studies reviewed were summarized above. The additional studies were reviewed today:  Zio patch 04/2023:   The patient was monitored for 14 days.   The predominant rhythm was sinus with an average rate of 95 bpm (range 59-158 bpm).   There were rare PACs and PVCs.   No sustained arrhythmia or prolonged pause was observed.   Patient triggered events corresponded primarily to normal sinus rhythm and sinus tachycardia, though a few episodes also contain isolated PVCs.   Predominantly sinus rhythm with rare PACs and PVCs.  No significant arrhythmia observed. __________  2D echo 03/03/2023: 1. Left ventricular ejection fraction, by estimation, is 55 to 60%. Left  ventricular ejection fraction by 2D MOD biplane is 57.0 %. The left  ventricle has normal function. The left ventricle has no regional wall  motion abnormalities. Left ventricular  diastolic parameters are consistent with Grade I diastolic dysfunction  (impaired relaxation). The average left ventricular global longitudinal  strain is -17.8 %. The global  longitudinal strain is normal.   2. Right ventricular systolic function is normal. The right ventricular  size is normal.   3. The mitral valve is normal in structure. No evidence of mitral valve  regurgitation.   4. The aortic valve was not well visualized. Aortic valve regurgitation  is not visualized.   5. The inferior vena cava is normal in size with greater than 50%  respiratory variability, suggesting right atrial pressure of 3 mmHg.  __________   Lexiscan  MPI 03/12/2023: Pharmacological myocardial perfusion imaging study with no significant  ischemia Normal wall motion, EF estimated at 67% No EKG changes concerning for ischemia at peak stress or in recovery. CT attenuation correction images with no significant aortic atherosclerosis, no coronary calcification Low risk scan __________   Lexiscan  MPI 04/2023:   The patient was monitored for 14 days.   The predominant rhythm was sinus with an average rate of 95 bpm (range 59-158 bpm).   There were rare  PACs and PVCs.   No sustained arrhythmia or prolonged pause was observed.   Patient triggered events corresponded primarily to normal sinus rhythm and sinus tachycardia, though a few episodes also contain isolated PVCs.   Predominantly sinus rhythm with rare PACs and PVCs.  No significant arrhythmia observed.   EKG:  EKG is ordered today.  The EKG ordered today demonstrates ***  Recent Labs: No results found for requested labs within last 365 days.  Recent Lipid Panel No results found for: "CHOL", "TRIG", "HDL", "CHOLHDL", "VLDL", "LDLCALC", "LDLDIRECT"  PHYSICAL EXAM:    VS:  There were no vitals taken for this visit.  BMI: There is no height or weight on file to calculate BMI.  Physical Exam  Wt Readings from Last 3 Encounters:  01/06/24 236 lb (107 kg)  12/04/23 220 lb (99.8 kg)  06/25/23 220 lb (99.8 kg)     ASSESSMENT & PLAN:   Dizziness: ***   {Are you ordering a CV Procedure (e.g. stress test, cath, DCCV,  TEE, etc)?   Press F2        :[id number]}     Disposition: F/u with Dr. Nolan Battle or an APP in ***.   Medication Adjustments/Labs and Tests Ordered: Current medicines are reviewed at length with the patient today.  Concerns regarding medicines are outlined above. Medication changes, Labs and Tests ordered today are summarized above and listed in the Patient Instructions accessible in Encounters.   Signed, Varney Gentleman, PA-C 01/17/2024 8:36 AM     [facility] [address] [place], [place] [address] [phone]

## 2024-01-19 DIAGNOSIS — S92515A Nondisplaced fracture of proximal phalanx of left lesser toe(s), initial encounter for closed fracture: Secondary | ICD-10-CM | POA: Diagnosis not present

## 2024-01-24 DIAGNOSIS — S93401D Sprain of unspecified ligament of right ankle, subsequent encounter: Secondary | ICD-10-CM | POA: Diagnosis not present

## 2024-01-24 DIAGNOSIS — S8391XD Sprain of unspecified site of right knee, subsequent encounter: Secondary | ICD-10-CM | POA: Diagnosis not present

## 2024-01-25 DIAGNOSIS — M25561 Pain in right knee: Secondary | ICD-10-CM | POA: Diagnosis not present

## 2024-01-28 NOTE — Progress Notes (Unsigned)
 Cardiology Office Note    Date:  02/02/2024   ID:  Stacy Daniel, Stacy Daniel 20-Aug-1971, MRN [id number]  PCP:  Lorrie Rothman, MD  Cardiologist:  Sammy Crisp, MD  Electrophysiologist:  None   Chief Complaint: Follow up  History of Present Illness:   Stacy Daniel is a 53 y.o. female with history of DM2, rheumatoid arthritis, fibromyalgia, bipolar disorder, migraines, anxiety, and multiple medication intolerances who presents for follow-up of dizziness.    She was evaluated as a new patient by Dr. Nolan Battle on 01/14/2023 at the request of her PCP for evaluation of shortness of breath and dizziness.  At the time of her visit with Dr. Nolan Battle, she had several concerns including an approximate 72-month history of dizziness anytime she stood up after her head is below her heart level, described as a sensation of feeling off balance with the room spinning with associated sensation of near syncope with symptoms lasting for about 5 minutes regardless of if she sat down or lied down.  No frank syncope.  It was noted she had lost approximately 75 pounds over the preceding year with Ozempic, though had gained about 10 pounds back.  She was staying well-hydrated.  She also reported a longstanding history of chest tightness in the center of her chest without radiation with some associated shortness of breath and most commonly occurred while resting and improved with activity.  She also reported chronic lower extremity edema with the left being greater than the right that has been present for years.  She reported prior negative lower extremity venous duplexes in the past.  She reported having undergone stress testing approximately 30 years ago and indicated the test had to be stopped fairly quickly due to elevation in heart rate.  She also believed she had an echo at that time that may have shown MVP.  EKG in the office showed sinus rhythm without abnormalities.  Orthostatic vital signs in the office were  notable for a borderline drop from sitting to initial standing with subsequent recovery with continued standing.  There was question of some of her medications contributing to her dizziness with recommendation for her to discuss this with her PCP and mental health care provider, particularly in light of her weight loss over the preceding year.  With regards to her chest pain, there was low suspicion for ASCVD with symptoms felt to be atypical in nature with consideration for pharmacologic MPI or PET/CT given the patient's history of anaphylactic reaction to IV contrast.   Echo on 02/21/2023 showed an EF of 55 to 60%, no regional wall motion abnormalities, grade 1 diastolic dysfunction, normal RV systolic function and ventricular cavity size, no significant valvular abnormalities, and normal CVP.   She was seen in the office in 02/2023 noting improvement in her dizziness following closer monitoring of blood glucose.  Chest discomfort and dyspnea were also somewhat improved.  Chronic lower extremity swelling was stable and dated back several decades.  She was wearing compression socks daily.  Lexiscan  MPI on 03/12/2023 showed no significant ischemia with an EF of 67%.  CT attenuated corrected images showed no coronary calcification or significant aortic atherosclerosis.  Overall, this was a low risk scan.   She was seen in the office on 05/07/2023 and did continue to note some positional dizziness with bending over and upon raising up.  Subsequent Zio patch showed a predominant rhythm of sinus with an average rate of 95 bpm (range 59 to 158 bpm), rare PACs  and PVCs, no sustained arrhythmias or prolonged pauses, and patient triggered events corresponded to primarily sinus rhythm and sinus tachycardia with a few episodes containing isolated PVCs.  She was last seen in the office in 06/2023 and doing well from a cardiac perspective with occasional palpitations that were short-lived as well as some positional  dizziness, particularly if bending over and raising up.  Lower extremity swelling was improved with compression socks.  She comes in doing very well from a cardiac perspective and is without symptoms of angina or cardiac decompensation.  No further dizziness.  No palpitations, presyncope, or syncope.  No further bendopnea.  She did suffer a mechanical fall in the setting of her right knee giving out a couple months ago and is currently ambulating with a cane.  In this setting she did stop the fourth toe on the left foot against the cane and breaking this toe.  Given this, she has been unable to wear compression socks on the left lower extremity has noted some ankle swelling.  Otherwise, she is without acute issues at this time and overall feels very well.   Labs independently reviewed: 12/2023 - potassium 4.7, BUN 12, serum creatinine 0.8, albumin 4.4, AST/ALT normal, TC 191, TG 166, HDL 61, LDL 97 08/2023 - A1c 6.1 01/2023 - Hgb 14.5, PLT 283, TSH normal   Past Medical History:  Diagnosis Date   Angioedema    Anxiety    Bipolar 1 disorder (HCC)    Borderline personality disorder (HCC)    Breast pain in female    chronic, right   Chronic bronchitis (HCC)    COPD (chronic obstructive pulmonary disease) (HCC)    Diabetes mellitus without complication (HCC)    Fibromyalgia    Hypoglycemia    IC (interstitial cystitis)    OCD (obsessive compulsive disorder)    Osteoarthritis    Osteoarthritis    PCOS (polycystic ovarian syndrome)    Post traumatic stress disorder    Rheumatoid arthritis (HCC)    Rheumatoid arthritis (HCC)    Schizoaffective disorder, bipolar type (HCC)    Shingles (herpes zoster) polyneuropathy     Past Surgical History:  Procedure Laterality Date   ABDOMINAL HYSTERECTOMY  06/1997   with left ovarectomy   COLONOSCOPY N/A 10/21/2021   Procedure: COLONOSCOPY;  Surgeon: Shane Darling, MD;  Location: [facility] ENDOSCOPY;  Service: Endoscopy;  Laterality: N/A;  DM    GANGLION CYST EXCISION  11/2010   right wrist   OOPHORECTOMY Right 1995   WISDOM TOOTH EXTRACTION  1988    Current Medications: Current Meds  Medication Sig   AJOVY 225 MG/1.5ML SOSY    albuterol (VENTOLIN HFA) 108 (90 Base) MCG/ACT inhaler ProAir HFA   B Complex Vitamins (VITAMIN B COMPLEX PO) Take 1 tablet by mouth daily.   BD PEN NEEDLE NANO U/F 32G X 4 MM MISC    Blood Glucose Monitoring Suppl (ONE TOUCH ULTRA 2) w/Device KIT BY XX ROUTE AS DIRECTED   cholecalciferol (VITAMIN D) 1000 units tablet Take 1,000 Units by mouth 2 (two) times daily.   Continuous Glucose Sensor (FREESTYLE LIBRE 2 SENSOR) MISC by Does not apply route.   diphenhydrAMINE  (BENADRYL ) 50 MG capsule Take 1 tablet within 1 hour of the scan   EPINEPHrine  0.3 mg/0.3 mL IJ SOAJ injection EpiPen  2-Pak   estradiol  (ESTRACE ) 1 MG tablet Take 1 tablet (1 mg total) by mouth daily.   gabapentin (NEURONTIN) 300 MG capsule Take 300 mg by mouth 4 (four) times daily.  levalbuterol (XOPENEX HFA) 45 MCG/ACT inhaler Xopenex HFA   levocetirizine (XYZAL) 5 MG tablet Take 5 mg by mouth every evening.   levothyroxine (SYNTHROID, LEVOTHROID) 75 MCG tablet Take 75 mcg by mouth at bedtime.   metFORMIN (GLUCOPHAGE) 500 MG tablet Take 500 mg by mouth daily with breakfast.   montelukast (SINGULAIR) 10 MG tablet Take 10 mg by mouth at bedtime.   Multiple Vitamins-Minerals (MULTIVITAMIN ADULT EXTRA C PO)    Multiple Vitamins-Minerals (PRESERVISION AREDS 2 PO) Take 1 tablet by mouth in the morning and at bedtime.   omeprazole (PRILOSEC) 20 MG capsule Take 20 mg by mouth daily. At night.   ONE TOUCH ULTRA TEST test strip USE TO CHECK BLOOD SUGARS 2 TO 3 TIMES A DAY UTD FOR 90 DAYS   OneTouch Delica Lancets 33G MISC Apply 1 each topically 2 (two) times daily.   OZEMPIC, 2 MG/DOSE, 8 MG/3ML SOPN Inject into the skin. 2 mg a week   RELPAX 40 MG tablet TK 1 T PO AT ONSET MAY REPEAT Q 2 H  NOT TO EXCEED 5 TS  D   rizatriptan (MAXALT) 10 MG tablet  TK 1 T PO AOS OF HEADACHE. MAY REPEAT IN 2 H IF NO IMPROVEMENT. MAXIMUM OF 30 MG PER DAY   Saccharomyces boulardii (PROBIOTIC) 250 MG CAPS Take by mouth.   thiothixene (NAVANE) 1 MG capsule Take 2 mg by mouth 2 (two) times daily.   tiZANidine (ZANAFLEX) 2 MG tablet Take 2 mg by mouth 3 (three) times daily. Take half tablet every 4 hours.   venlafaxine XR (EFFEXOR-XR) 150 MG 24 hr capsule Effexor XR   VRAYLAR 4.5 MG CAPS Take 1 capsule by mouth daily.    Allergies:   Albuterol; Aripiprazole; Azelastine; Budesonide; Budesonide-formoterol fumarate; Ciclesonide; Fluticasone; Fluticasone furoate; Fluticasone-salmeterol; Gadolinium derivatives; Hydrocodone-acetaminophen ; Hydroxyzine; Iodinated contrast media; Iodine; Latex; Mometasone; Other; Peanut-containing drug products; Penicillins; Petrolatum; Petroleum distillate; Tamsulosin; Tdap [tetanus-diphth-acell pertussis]; Venlafaxine; Zinc oxide; Albuterol sulfate; Antihistamines, chlorpheniramine-type; Cetirizine & related; Diphenhydramine ; Duloxetine; Fexofenadine; Gluten meal; Haloperidol and related; Ipratropium-albuterol; Ipratropium-albuterol; Levothyroxine; Levothyroxine sodium; Metformin and related; Milk (cow); Montelukast; Montelukast sodium; Omeprazole; Pineapple; Red dye #40 (allura red); Sitagliptin; Ziprasidone hcl; Adhesive [tape]; and Bupropion   Social History   Socioeconomic History   Marital status: Single    Spouse name: Not on file   Number of children: 0   Years of education: 16   Highest education level: Not on file  Occupational History    Comment: volunteers  Tobacco Use   Smoking status: Never   Smokeless tobacco: Never  Vaping Use   Vaping status: Never Used  Substance and Sexual Activity   Alcohol use: No    Comment: quit 2011   Drug use: No   Sexual activity: Not Currently    Birth control/protection: Surgical  Other Topics Concern   Not on file  Social History Narrative   Lives with parents   Caffeine use-  coffee 2 daily, soda 2 daily   Social Drivers of Health   Financial Resource Strain: Low Risk  (12/23/2023)   Received from [facility]   Overall Financial Resource Strain (CARDIA)    Difficulty of Paying Living Expenses: Not hard at all  Food Insecurity: No Food Insecurity (12/23/2023)   Received from [facility]   Hunger Vital Sign    Worried About Running Out of Food in the Last Year: Never true    Ran Out of Food in the Last Year: Never true  Transportation  Needs: No Transportation Needs (12/23/2023)   Received from [facility] - Transportation    In the past 12 months, has lack of transportation kept you from medical appointments or from getting medications?: No    Lack of Transportation (Non-Medical): No  Physical Activity: Sufficiently Active (12/30/2017)   Exercise Vital Sign    Days of Exercise per Week: 3 days    Minutes of Exercise per Session: 60 min  Stress: Not on file  Social Connections: Not on file     Family History:  The patient's family history includes Arrhythmia (age of onset: 8) in her mother; Bipolar disorder in her cousin and maternal grandmother; Breast cancer in her cousin and paternal aunt; Cancer in her sister; Diabetes in her cousin, father, paternal aunt, and paternal grandmother; Heart attack (age of onset: 93) in her maternal grandfather; Hyperthyroidism in her maternal grandmother and mother; Obesity in her father; Osteoporosis in her maternal grandmother; Stroke in her paternal aunt. There is no history of Bladder Cancer, Prostate cancer, Kidney cancer, Ovarian cancer, Colon cancer, or Heart disease.  ROS:   12-point review of systems is negative unless otherwise noted in the HPI.   EKGs/Labs/Other Studies Reviewed:    Studies reviewed were summarized above. The additional studies were reviewed today:  Zio patch 04/2023:   The patient was monitored for 14 days.   The predominant  rhythm was sinus with an average rate of 95 bpm (range 59-158 bpm).   There were rare PACs and PVCs.   No sustained arrhythmia or prolonged pause was observed.   Patient triggered events corresponded primarily to normal sinus rhythm and sinus tachycardia, though a few episodes also contain isolated PVCs.   Predominantly sinus rhythm with rare PACs and PVCs.  No significant arrhythmia observed. __________  2D echo 03/03/2023: 1. Left ventricular ejection fraction, by estimation, is 55 to 60%. Left  ventricular ejection fraction by 2D MOD biplane is 57.0 %. The left  ventricle has normal function. The left ventricle has no regional wall  motion abnormalities. Left ventricular  diastolic parameters are consistent with Grade I diastolic dysfunction  (impaired relaxation). The average left ventricular global longitudinal  strain is -17.8 %. The global longitudinal strain is normal.   2. Right ventricular systolic function is normal. The right ventricular  size is normal.   3. The mitral valve is normal in structure. No evidence of mitral valve  regurgitation.   4. The aortic valve was not well visualized. Aortic valve regurgitation  is not visualized.   5. The inferior vena cava is normal in size with greater than 50%  respiratory variability, suggesting right atrial pressure of 3 mmHg.  __________   Lexiscan  MPI 03/12/2023: Pharmacological myocardial perfusion imaging study with no significant  ischemia Normal wall motion, EF estimated at 67% No EKG changes concerning for ischemia at peak stress or in recovery. CT attenuation correction images with no significant aortic atherosclerosis, no coronary calcification Low risk scan __________   Lexiscan  MPI 04/2023:   The patient was monitored for 14 days.   The predominant rhythm was sinus with an average rate of 95 bpm (range 59-158 bpm).   There were rare PACs and PVCs.   No sustained arrhythmia or prolonged pause was observed.   Patient  triggered events corresponded primarily to normal sinus rhythm and sinus tachycardia, though a few episodes also contain isolated PVCs.   Predominantly sinus rhythm with rare PACs and PVCs.  No significant arrhythmia  observed.   EKG:  EKG is ordered today.  The EKG ordered today demonstrates tachycardia, 107 bpm, no acute ST-T changes  Recent Labs: No results found for requested labs within last 365 days.  Recent Lipid Panel No results found for: "CHOL", "TRIG", "HDL", "CHOLHDL", "VLDL", "LDLCALC", "LDLDIRECT"  PHYSICAL EXAM:    VS:  BP 112/78   Pulse (!) 107   Ht 5\' 3"  (1.6 m)   Wt 230 lb 6.4 oz (104.5 kg)   SpO2 95%   BMI 40.81 kg/m   BMI: Body mass index is 40.81 kg/m.  Physical Exam Vitals reviewed.  Constitutional:      Appearance: She is well-developed.  HENT:     Head: Normocephalic and atraumatic.  Eyes:     General:        Right eye: No discharge.        Left eye: No discharge.  Cardiovascular:     Rate and Rhythm: Normal rate and regular rhythm.     Pulses:          Posterior tibial pulses are 2+ on the right side and 2+ on the left side.     Heart sounds: Normal heart sounds, S1 normal and S2 normal. Heart sounds not distant. No midsystolic click and no opening snap. No murmur heard.    No friction rub.  Pulmonary:     Effort: Pulmonary effort is normal. No respiratory distress.     Breath sounds: Normal breath sounds. No decreased breath sounds, wheezing, rhonchi or rales.  Chest:     Chest wall: No tenderness.  Musculoskeletal:     Cervical back: Normal range of motion.  Skin:    General: Skin is warm and dry.     Nails: There is no clubbing.  Neurological:     Mental Status: She is alert and oriented to person, place, and time.  Psychiatric:        Speech: Speech normal.        Behavior: Behavior normal.        Thought Content: Thought content normal.        Judgment: Judgment normal.     Wt Readings from Last 3 Encounters:  02/02/24 230 lb  6.4 oz (104.5 kg)  01/06/24 236 lb (107 kg)  12/04/23 220 lb (99.8 kg)     ASSESSMENT & PLAN:   Dizziness: Resolved.  Reassuring cardiac workup including echo, Lexiscan  MPI, and Zio patch.  Continue to recommend slow positional changes and active lifestyle.  Compression socks and leg elevation recommended.  No indication for further cardiac testing at this time.      Disposition: F/u with Dr. Nolan Battle or an APP in 12 months.   Medication Adjustments/Labs and Tests Ordered: Current medicines are reviewed at length with the patient today.  Concerns regarding medicines are outlined above. Medication changes, Labs and Tests ordered today are summarized above and listed in the Patient Instructions accessible in Encounters.   Signed, Varney Gentleman, PA-C 02/02/2024 4:58 PM     [facility] - [place] [address] [place], [place] [address] [phone]

## 2024-02-01 DIAGNOSIS — M25561 Pain in right knee: Secondary | ICD-10-CM | POA: Diagnosis not present

## 2024-02-02 ENCOUNTER — Ambulatory Visit: Attending: Physician Assistant | Admitting: Physician Assistant

## 2024-02-02 ENCOUNTER — Encounter: Payer: Self-pay | Admitting: Physician Assistant

## 2024-02-02 VITALS — BP 112/78 | HR 107 | Ht 63.0 in | Wt 230.4 lb

## 2024-02-02 DIAGNOSIS — R42 Dizziness and giddiness: Secondary | ICD-10-CM

## 2024-02-02 NOTE — Patient Instructions (Signed)
 Medication Instructions:   Your physician recommends that you continue on your current medications as directed. Please refer to the Current Medication list given to you today.   *If you need a refill on your cardiac medications before your next appointment, please call your pharmacy*  Lab Work:  No labs ordered today   If you have labs (blood work) drawn today and your tests are completely normal, you will receive your results only by: MyChart Message (if you have MyChart) OR A paper copy in the mail If you have any lab test that is abnormal or we need to change your treatment, we will call you to review the results.  Testing/Procedures:  No test ordered today   Follow-Up: At [facility], you and your health needs are our priority.  As part of our continuing mission to provide you with exceptional heart care, our providers are all part of one team.  This team includes your primary Cardiologist (physician) and Advanced Practice Providers or APPs (Physician Assistants and Nurse Practitioners) who all work together to provide you with the care you need, when you need it.  Your next appointment:   12 month(s)  Provider:   Varney Gentleman, PA-C , Sammy Crisp, MD

## 2024-02-03 DIAGNOSIS — S92515D Nondisplaced fracture of proximal phalanx of left lesser toe(s), subsequent encounter for fracture with routine healing: Secondary | ICD-10-CM | POA: Diagnosis not present

## 2024-02-15 DIAGNOSIS — M25561 Pain in right knee: Secondary | ICD-10-CM | POA: Diagnosis not present

## 2024-02-29 DIAGNOSIS — Z91010 Allergy to peanuts: Secondary | ICD-10-CM | POA: Diagnosis not present

## 2024-02-29 DIAGNOSIS — J301 Allergic rhinitis due to pollen: Secondary | ICD-10-CM | POA: Diagnosis not present

## 2024-02-29 DIAGNOSIS — J3089 Other allergic rhinitis: Secondary | ICD-10-CM | POA: Diagnosis not present

## 2024-02-29 DIAGNOSIS — J302 Other seasonal allergic rhinitis: Secondary | ICD-10-CM | POA: Diagnosis not present

## 2024-02-29 DIAGNOSIS — K219 Gastro-esophageal reflux disease without esophagitis: Secondary | ICD-10-CM | POA: Diagnosis not present

## 2024-02-29 DIAGNOSIS — Z87892 Personal history of anaphylaxis: Secondary | ICD-10-CM | POA: Diagnosis not present

## 2024-02-29 DIAGNOSIS — J4540 Moderate persistent asthma, uncomplicated: Secondary | ICD-10-CM | POA: Diagnosis not present

## 2024-03-01 DIAGNOSIS — Z79899 Other long term (current) drug therapy: Secondary | ICD-10-CM | POA: Diagnosis not present

## 2024-03-01 DIAGNOSIS — E559 Vitamin D deficiency, unspecified: Secondary | ICD-10-CM | POA: Diagnosis not present

## 2024-03-01 DIAGNOSIS — M859 Disorder of bone density and structure, unspecified: Secondary | ICD-10-CM | POA: Diagnosis not present

## 2024-03-01 DIAGNOSIS — M542 Cervicalgia: Secondary | ICD-10-CM | POA: Diagnosis not present

## 2024-03-01 DIAGNOSIS — M8589 Other specified disorders of bone density and structure, multiple sites: Secondary | ICD-10-CM | POA: Diagnosis not present

## 2024-03-01 DIAGNOSIS — M797 Fibromyalgia: Secondary | ICD-10-CM | POA: Diagnosis not present

## 2024-03-01 DIAGNOSIS — M064 Inflammatory polyarthropathy: Secondary | ICD-10-CM | POA: Diagnosis not present

## 2024-03-01 DIAGNOSIS — M810 Age-related osteoporosis without current pathological fracture: Secondary | ICD-10-CM | POA: Diagnosis not present

## 2024-03-03 DIAGNOSIS — Z79899 Other long term (current) drug therapy: Secondary | ICD-10-CM | POA: Diagnosis not present

## 2024-03-03 DIAGNOSIS — E039 Hypothyroidism, unspecified: Secondary | ICD-10-CM | POA: Diagnosis not present

## 2024-03-03 DIAGNOSIS — E119 Type 2 diabetes mellitus without complications: Secondary | ICD-10-CM | POA: Diagnosis not present

## 2024-03-03 NOTE — Progress Notes (Addendum)
 HPI: Stacy Daniel is a 53 y.o. female who presents for follow up type 2 diabetes.  She was diagnosed with diabetes at age 89.  I last saw her in 12/24.   She has done well since that time.   She is currently on Ozempic 2 mg daily and MTF 500 mg qam. She remains off glipizide.   She uses the [employer] for CGM.  It was downloaded and reviewed.  Her 14 day average was 99.  Her TIR is 97%.  She tends to spike in the morning.  Review of her diet shows that she avoids concentrated carbs.  She started going to the gym but hasn't been consistent because she injured her knee and broke a toe.  She is up 3 pounds since last visit.   She denies numbness/tingling/burning in her feet.  She is concerned about her diabetes.     ROS:  No chest pain.  No shortness of breath.   Medical History: Past Medical History:  Diagnosis Date  . Allergic rhinitis due to allergen   . Allergy 1986  . Anxiety   . Asthma, unspecified asthma severity, unspecified whether complicated, unspecified whether persistent (HHS-HCC) 1997  . Bipolar affective disorder (CMS/HHS-HCC)   . Borderline personality disorder (CMS/HHS-HCC)   . COPD (chronic obstructive pulmonary disease) (CMS/HHS-HCC)   . Dependent personality disorder (CMS/HHS-HCC)   . Depression   . Diabetes mellitus type 2, uncomplicated (CMS/HHS-HCC)   . Dissociative disorder   . Elevated LFTs   . Fatty liver   . GERD (gastroesophageal reflux disease) 1997  . Heart murmur 1997  . Hyperlipidemia   . Hypothyroidism   . NAFLD (nonalcoholic fatty liver disease)   . NASH (nonalcoholic steatohepatitis)   . Osteopenia   . Osteoporosis, post-menopausal 2015  . PTSD (post-traumatic stress disorder)   . RA (rheumatoid arthritis) (CMS/HHS-HCC)   . Schizophrenia (CMS/HHS-HCC)   . Sleep apnea 2017    Surgical History: Past Surgical History:  Procedure Laterality Date  . COLONOSCOPY  10/21/2021   Internal Hemorrhoids/Otherwise normal colon/FHx CC/Repeat 30yrs/CTL  .  HYSTERECTOMY    . WRIST GANGLION EXCISION Right     Social History:  reports that she has never smoked. She has never used smokeless tobacco. She reports that she does not currently use alcohol. She reports that she does not use drugs.  Family History: family history includes Allergic rhinitis in her father and sister; Allergies in her father; Alzheimer's disease in her maternal grandmother; Anxiety in her mother; Bipolar disorder in her maternal grandmother; Breast cancer in her paternal aunt; Celiac disease in her paternal aunt; Coronary Artery Disease (Blocked arteries around heart) in her paternal grandfather; Diabetes in her father; Diabetes type II in her father and paternal aunt; High blood pressure (Hypertension) in her father and paternal aunt; Hip fracture in her maternal grandmother; Hyperthyroidism in her mother; Osteoporosis (Thinning of bones) in her maternal grandmother and mother; Rheum arthritis in her maternal grandmother; Stroke in her paternal aunt; Thyroid  disease in her maternal grandmother and mother.  Medications: Current Outpatient Medications  Medication Sig Dispense Refill  . blood glucose meter kit by XX route as directed 1 each 0  . blood-glucose sensor (FREESTYLE LIBRE 3 PLUS SENSOR) Devi Use 1 each every 15 (fifteen) days 6 each 3  . calcium carbonate/vitamin D3 (CALCIUM+D ORAL) Take by mouth    . cariprazine 1.5 mg Cap Take 1.5 mg by mouth once daily    . cholecalciferol (VITAMIN D3) 2,000 unit capsule Take 4,000  Units by mouth once daily.      . diphenhydrAMINE  (BENADRYL ) 50 MG capsule Take 50 mg by mouth every 6 (six) hours as needed for Allergies.    . epinephrine  (EPIPEN ) 0.3 mg/0.3 mL pen injector 1 unit intramuscularly daily as needed  1  . estradioL  (ESTRACE ) 1 MG tablet TAKE 1 TABLET BY MOUTH EVERY DAY 90 tablet 0  . fremanezumab-vfrm (AJOVY SYRINGE) 225 mg/1.5 mL Syrg Inject 225 mg subcutaneously every 28 (twenty-eight) days 1.5 mL 5  . gabapentin  (NEURONTIN) 300 MG capsule Take 600 mg three times a day along with 900 mg at night 450 capsule 1  . levalbuterol (XOPENEX HFA) inhaler Inhale 2 inhalations into the lungs every 4 (four) hours as needed for Wheezing.    SABRA levocetirizine (XYZAL) 5 MG tablet Take 1 tablet (5 mg total) by mouth 2 (two) times daily 180 tablet 0  . metFORMIN (GLUCOPHAGE) 500 MG tablet TAKE 1 TABLET BY MOUTH EVERY DAY WITH BREAKFAST 90 tablet 4  . montelukast (SINGULAIR) 10 mg tablet TAKE 1 TABLET BY MOUTH EVERY DAY 90 tablet 2  . omeprazole (PRILOSEC) 20 MG DR capsule Take 20 mg by mouth 2 (two) times daily before meals    . ONETOUCH ULTRA TEST test strip USE 2 (TWO) TIMES DAILY E11.649 200 strip 5  . rizatriptan (MAXALT) 10 MG tablet TAKE 1 TABLET (10 MG TOTAL) BY MOUTH AS DIRECTED FOR MIGRAINE TAKE 10 MG AT HEADACHE ONSET. TAKE ANOTHER TABLET IN 2 HOURS IF NEEDED. TAKE NO MORE THAN 2 TABLETS IN A 24 HOUR PERIOD. 9 tablet 1  . SYNTHROID 75 mcg tablet TAKE 1 TABLET (75 MCG TOTAL) BY MOUTH ONCE DAILY SYNTHROID BRAND ONLY 90 tablet 3  . thiothixene (NAVANE) 2 MG capsule Take 4 mg by mouth 2 (two) times daily    . tiZANidine (ZANAFLEX) 2 MG tablet Use as needed no more than 3 per day    . valACYclovir (VALTREX) 1000 MG tablet TAKE 1 TABLET(1000 MG) BY MOUTH THREE TIMES DAILY 30 tablet 0  . venlafaxine (EFFEXOR-XR) 75 MG XR capsule TAKE 3 CAPSULES BY MOUTH ONCE DAILY. 270 capsule 1  . vitamin B complex (B COMPLEX VITAMINS ORAL) Take by mouth    . blood-glucose sensor Use 1 each every 14 (fourteen) days 6 each 1  . flash glucose sensor (FREESTYLE LIBRE 2 SENSOR) Kit USE 1 EACH EVERY 14 (FOURTEEN) DAYS (Patient not taking: Reported on 12/23/2023) 6 kit 4  . Lactobac no.41/Bifidobact no.7 (PROBIOTIC-10 ORAL) Take by mouth (Patient not taking: Reported on 12/23/2023)    . lancets Use 1 each 2 (two) times daily Use as instructed. 200 each 1  . tirzepatide (MOUNJARO) 10 mg/0.5 mL pen injector Inject 0.5 mLs (10 mg total)  subcutaneously every 7 (seven) days 2 mL 6   No current facility-administered medications for this visit.    Allergies: Allergies  Allergen Reactions  . Diphtheria,Pertussis,Tetanus Anaphylaxis  . Iodine Anaphylaxis  . Latex Anaphylaxis  . Penicillins Anaphylaxis  . Petroleum Distillates Anaphylaxis, Angioedema and Rash    (plastics) Other reaction(s): Angioedema Printmaker) (plastics)  . Tetanus And Diphtheria Toxoids, Adsorbed, Adult Anaphylaxis  . Abilify [Aripiprazole] Anaphylaxis  . Advair Diskus [Fluticasone Propion-Salmeterol] Anaphylaxis  . Alvesco [Ciclesonide] Anaphylaxis  . Astelin [Azelastine] Unknown  . Azithromycin Unknown  . Diphenhydramine  Other (See Comments)    Doesn't work-can take Benadryl  Other reaction(s): Other (See Comments), Other (See Comments) Doesn't work-can take Benadryl  Doesn't work-can take Benadryl   . Flomax [Tamsulosin] Unknown  .  Flonase [Fluticasone] Unknown  . Geodon [Ziprasidone Hcl] Unknown  . Gluten Other (See Comments)    Doesn't digest Other reaction(s): Other (See Comments), Other (See Comments) Doesn't digest Doesn't digest  . Haldol [Haloperidol] Unknown  . Hydroxyzine Hcl Anaphylaxis  . Iodine And Iodide Containing Products Anaphylaxis  . Ipratropium-Albuterol Unknown    Other reaction(s): Angioedema (ALLERGY/intolerance)  . Levothyroxine Sodium Other (See Comments)    Can take Synthroid  . Metformin Other (See Comments)    Other Reaction: GI UPSET  . Metformin Other (See Comments)    Can take glucophage  . Milk Angioedema    Other reaction(s): Angioedema  . Mometasone Unknown    Other reaction(s): Angioedema (ALLERGY/intolerance)  . Montelukast Other (See Comments)    Other reaction(s): Mental Status Changes (intolerance), Other (See Comments)  . Nsaids (Non-Steroidal Anti-Inflammatory Drug) Anaphylaxis and Angioedema  . Omeprazole Other (See Comments)    Can take Prilosec  . Other Unknown and Other (See Comments)     PETROCHEMICAL PRODUCTS Artificial sweetners-depression Pitted fruits-unknown reaction  . Others Anaphylaxis    Uncoded Allergy. Allergen: darvocet  . Others Anaphylaxis    Uncoded Allergy. Allergen: PETROCHEMICAL  . Others Unknown    Uncoded Allergy. Allergen: VINYL PRODUCTS  . Peanut Angioedema  . Pineapple Other (See Comments)    unknown Other reaction(s): Other (See Comments) unknown unknown  . Proventil [Albuterol Sulfate] Unknown  . Pulmicort [Budesonide] Unknown  . Red Dye Other (See Comments)    migraines Other reaction(s): GI Upset (intolerance), Other (See Comments) migraines migraines  . Risperidone Unknown  . Spiriva With Handihaler [Tiotropium Bromide] Anaphylaxis  . Symbicort [Budesonide-Formoterol] Anaphylaxis  . Venlafaxine Unknown and Other (See Comments)    Blurred vision-can take Effexor  . Veramyst [Fluticasone Furoate] Unknown  . Vicodin [Hydrocodone-Acetaminophen ] Anaphylaxis    Physical Exam: Vitals:   03/03/24 1252  BP: 126/80  Pulse: 104  SpO2: 97%  Weight: (!) 103.9 kg (229 lb)  Height: 160 cm (5' 3)     Body mass index is 40.57 kg/m. Gen: WDWN in NAD    Physical exam otherwise deferred due to coronavirus precautions.   Labs: 12/22/2019:  A1c = 5.5 08/23/2020:  A1c = 8.2  03/14/2021:  A1c = 5.5.  MA = 30.5.  TSH = 0.89 05/28/2021:  K/Cr/Ca = 4.2/0.6/9.0.  Chol = 169/166/49.8/86.  LFTs nl except for ALT/AST = 131/105.  09/15/2021:  A1c = 5.6 11/26/2021:  K/Cr/Ca = 4.7/0.82/9.9.  Chol = 158/135/55/76.  LFTs nl except for AST/ALT = 61/92.  02/09/2022:  A1c = 5.1.  K/Cr/Ca= 4.0/0.8/9.7, glucose = 54.  MA<7.  LFTs nl except ALT = 48.  TSH=1.584.   06/02/2022:  K/Cr/Ca = 4.5/0.7/9.5.  Chol = 177/124/55.3/97.  LFTs nl.  08/14/2022:  A1c = 5.1  12/03/2022:  K/Cr/Ca = 4.8/0.7/9.5.  Chol = 185/167/61.3/90.  LFTs nl 01/14/2023:  TSH = 1.792. 02/26/2023:  A1c = 5.4  07/27/2023: K/Cr/Ca = 4.7/0.7/9.5.  Cholesterol = 180/172/56.4/89.  LFTs  nl. 09/03/2023: A1c = 6.1.  MA <7.  12/23/2023: K/Cr/Ca = 4.7/0.8/9.7.  LFTs nl. Chol = 191/166/61.1/97. 03/03/2024: A1c = 5.7  Assessment/Plan: 1.  Type 2 Diabetes.  Her A1c today is 5.7.   Based on review of her CGM, I told her if we get Mounjaro covered, I will switch from Ozempic 2 mg to Mounjaro 10 mg once a week.  Hopefully this will help her lose weight.  I told her to message me once she is tolerating Mounjaro so I can increase  the dose to 12.5 mg, and then to reach out again when she's tolerating that so I can increase it to 15 mg weekly.  I encouraged lifestyle modifications and more exercise.  I sent rx for 10 mg of mounajro.  2.  Hypothyroidism.  She is currently on 75 mcg brand name synthroid.  Her most recent TSH in 5/24 was nl.  I will recheck today.   3.  Elevated LFTs.  Per GI.  LFTs normal recently in 4/25.  4.  MA associated with diabetes.  MA was minimally elevated in 7/22 but negative in 6/23. Consider ACE/ARB if MA significantly elevates.  5.  Prophylaxis.  I will plan a foot exam next visit.  She says she looks at her feet and they are fine.  She saw the eye doctor (Dr. Thedford Pan  [phone]) on 02/21/24 and no she had no retinopathy.  had a negative exam.  She is to f/u in 12 months.  She does have cataracts.   6.  She will return to clinic in 6 months.    Addendum:  03/03/2024 : K/Cr/Ca = 4.5/0.8/9.5.  MA<7.  Cholesterol = 162/140/56.3/78.  LFTs normal except ALT = 42.  TSH = 0.832.  B12 = 580.  Results sent via MyChart   04/06/24:  Pt messaged.  Tolerating 10mg  Mounjaro.  Will increase to 12.5. 05/04/24:  Pt messaged.  Having some GI side effects with 12.5 so doesn't want to increase at this point.  Told fine and stay on 12.5 as long necessary.  This note is partially prepared by Earla Daria Messier, Scribe, in the presence of and acting as the scribe of Dr. Debby Breaker , MD.     [facility], [place]

## 2024-03-07 DIAGNOSIS — M25561 Pain in right knee: Secondary | ICD-10-CM | POA: Diagnosis not present

## 2024-03-09 DIAGNOSIS — S92515D Nondisplaced fracture of proximal phalanx of left lesser toe(s), subsequent encounter for fracture with routine healing: Secondary | ICD-10-CM | POA: Diagnosis not present

## 2024-03-14 DIAGNOSIS — F411 Generalized anxiety disorder: Secondary | ICD-10-CM | POA: Diagnosis not present

## 2024-03-14 DIAGNOSIS — G479 Sleep disorder, unspecified: Secondary | ICD-10-CM | POA: Diagnosis not present

## 2024-03-14 DIAGNOSIS — M549 Dorsalgia, unspecified: Secondary | ICD-10-CM | POA: Diagnosis not present

## 2024-03-14 DIAGNOSIS — M6289 Other specified disorders of muscle: Secondary | ICD-10-CM | POA: Diagnosis not present

## 2024-03-14 DIAGNOSIS — R519 Headache, unspecified: Secondary | ICD-10-CM | POA: Diagnosis not present

## 2024-03-14 DIAGNOSIS — R251 Tremor, unspecified: Secondary | ICD-10-CM | POA: Diagnosis not present

## 2024-03-15 DIAGNOSIS — M25561 Pain in right knee: Secondary | ICD-10-CM | POA: Diagnosis not present

## 2024-03-28 DIAGNOSIS — M25561 Pain in right knee: Secondary | ICD-10-CM | POA: Diagnosis not present

## 2024-04-05 DIAGNOSIS — F319 Bipolar disorder, unspecified: Secondary | ICD-10-CM | POA: Diagnosis not present

## 2024-04-05 DIAGNOSIS — F419 Anxiety disorder, unspecified: Secondary | ICD-10-CM | POA: Diagnosis not present

## 2024-04-06 DIAGNOSIS — M25561 Pain in right knee: Secondary | ICD-10-CM | POA: Diagnosis not present

## 2024-04-07 ENCOUNTER — Ambulatory Visit
Admission: RE | Admit: 2024-04-07 | Discharge: 2024-04-07 | Disposition: A | Discharge status: Home / Self Care | Source: Ambulatory Visit | Attending: Urology | Admitting: Urology

## 2024-04-07 DIAGNOSIS — K8020 Calculus of gallbladder without cholecystitis without obstruction: Secondary | ICD-10-CM | POA: Diagnosis not present

## 2024-04-07 DIAGNOSIS — N2889 Other specified disorders of kidney and ureter: Secondary | ICD-10-CM | POA: Diagnosis not present

## 2024-04-07 DIAGNOSIS — D3502 Benign neoplasm of left adrenal gland: Secondary | ICD-10-CM | POA: Diagnosis not present

## 2024-04-07 DIAGNOSIS — N281 Cyst of kidney, acquired: Secondary | ICD-10-CM | POA: Diagnosis not present

## 2024-04-07 MED ORDER — GADOBUTROL 1 MMOL/ML IV SOLN
10.0000 mL | Freq: Once | INTRAVENOUS | Status: AC | PRN
  Administered 2024-04-07: 10 mL via INTRAVENOUS

## 2024-04-11 DIAGNOSIS — M25561 Pain in right knee: Secondary | ICD-10-CM | POA: Diagnosis not present

## 2024-04-17 ENCOUNTER — Ambulatory Visit: Admitting: Urology

## 2024-04-21 ENCOUNTER — Ambulatory Visit: Payer: Self-pay | Admitting: Urology

## 2024-04-26 DIAGNOSIS — M25561 Pain in right knee: Secondary | ICD-10-CM | POA: Diagnosis not present

## 2024-04-27 NOTE — Progress Notes (Signed)
 04/28/2024 8:32 PM   Stacy Daniel 10-12-1970 [id number]  Referring provider: Jeffie Cheryl BRAVO, MD [address] [place],  [place] [address]  Urological history: 1.  Interstitial cystitis - Managed with dietary precautions  2.  Bosniak 15F left renal cysts - MRI (04/2024) - Stable mildly complex multi-septated cystic lesions in the left kidney. Again no clear aggressive features of thick enhancing septa or nodules. No restricted diffusion. Simple follow up surveillance may be useful in 1 year.  3.  Left adrenal adenoma - MRI (04/2024) 14 mm benign  Chief Complaint  Patient presents with   Follow-up   HPI: Stacy Daniel is a 53 y.o. woman who presents today for MRI report.    Previous records reviewed.   She has a history of left Bosniak 15F renal cysts for which she undergoes yearly surveillance MRI.   She underwent an renal MRI on August 1 there has been minimal enlargement of the cystic lesions and there was no clear aggressive features of thick enhancing septa or nodules.  There is also a stable 14  mm  left adrenal  adenoma.     Her serum creatinine was 0.8 with a EGFR of 88 in June.  Hemoglobin A1c of 5.7 in June.  She is doing well today.  She has mild SUI, but it is not bothersome.  Patient denies any modifying or aggravating factors.  Patient denies any recent UTI's, gross hematuria, dysuria or suprapubic/flank pain.  Patient denies any fevers, chills, nausea or vomiting.      PMH: Past Medical History:  Diagnosis Date   Angioedema    Anxiety    Bipolar 1 disorder (HCC)    Borderline personality disorder (HCC)    Breast pain in female    chronic, right   Chronic bronchitis (HCC)    COPD (chronic obstructive pulmonary disease) (HCC)    Diabetes mellitus without complication (HCC)    Fibromyalgia    Hypoglycemia    IC (interstitial cystitis)    OCD (obsessive compulsive disorder)    Osteoarthritis    Osteoarthritis    PCOS (polycystic  ovarian syndrome)    Post traumatic stress disorder    Rheumatoid arthritis (HCC)    Rheumatoid arthritis (HCC)    Schizoaffective disorder, bipolar type (HCC)    Shingles (herpes zoster) polyneuropathy     Surgical History: Past Surgical History:  Procedure Laterality Date   ABDOMINAL HYSTERECTOMY  06/1997   with left ovarectomy   COLONOSCOPY N/A 10/21/2021   Procedure: COLONOSCOPY;  Surgeon: Maryruth Ole DASEN, MD;  Location: [facility] ENDOSCOPY;  Service: Endoscopy;  Laterality: N/A;  DM   GANGLION CYST EXCISION  11/2010   right wrist   OOPHORECTOMY Right 1995   WISDOM TOOTH EXTRACTION  1988    Home Medications:  Allergies as of 04/28/2024       Reactions   Albuterol Anaphylaxis   Aripiprazole Anaphylaxis   Azelastine Anaphylaxis   Budesonide Anaphylaxis   Budesonide-formoterol Fumarate Anaphylaxis   Ciclesonide Anaphylaxis   Fluticasone Anaphylaxis   Fluticasone Furoate Anaphylaxis   Fluticasone-salmeterol Anaphylaxis   Gadolinium Derivatives Anaphylaxis   Hydrocodone-acetaminophen  Anaphylaxis   Hydroxyzine Anaphylaxis   Iodinated Contrast Media Anaphylaxis   Other reaction(s): Unknown   Iodine Anaphylaxis   And iodide containing products   Latex Anaphylaxis   Mometasone Anaphylaxis   Other Reaction(s): Angioedema   Other Anaphylaxis   Petrochemical products   Peanut-containing Drug Products Anaphylaxis, Swelling   Penicillins Anaphylaxis   Petrolatum Anaphylaxis  Yellow   Petroleum Distillate Anaphylaxis, Rash   Other reaction(s): Angioedema (plastics) Other Reaction(s): Angioedema   Tamsulosin Anaphylaxis   Tdap [tetanus-diphth-acell Pertussis] Anaphylaxis   Venlafaxine Other (See Comments)   Migraine, causes eye to close   Zinc Oxide Anaphylaxis   Albuterol Sulfate Other (See Comments)   Gi distress   Antihistamines, Chlorpheniramine-type Other (See Comments)   Gi distress   Cetirizine & Related Other (See Comments)   Gi distress   Diphenhydramine      Other reaction(s): Other (See Comments), Other (See Comments) Doesn't work-can take Benadryl  Doesn't work-can take Benadryl    Duloxetine Other (See Comments)   Gi distress   Fexofenadine Other (See Comments)   Gi distress   Gluten Meal    Other reaction(s): Other (See Comments), Other (See Comments) Doesn't digest Doesn't digest   Haloperidol And Related Other (See Comments)   Gi distress   Ipratropium-albuterol Other (See Comments)   Gi distress   Ipratropium-albuterol Other (See Comments)   Levothyroxine Other (See Comments)   Gi distress   Levothyroxine Sodium Nausea And Vomiting   Metformin And Related Nausea And Vomiting   Milk (cow)    Other reaction(s): Angioedema   Montelukast    Other reaction(s): Mental Status Changes (intolerance), Other (See Comments)   Montelukast Sodium Other (See Comments)   Gi distress   Omeprazole Nausea And Vomiting   Other Reaction(s): GI Intolerance Can take Prilosec   Pineapple Other (See Comments)   Other reaction(s): Other (See Comments) unknown unknown   Red Dye #40 (allura Red) Other (See Comments)   Other reaction(s): GI Upset (intolerance), Other (See Comments) migraines Other Reaction(s): GI Intolerance   Sitagliptin Nausea And Vomiting   Ziprasidone Hcl Other (See Comments)   Gi distress   Adhesive [tape] Hives, Rash   Bupropion Anxiety        Medication List        Accurate as of April 28, 2024 11:59 PM. If you have any questions, ask your nurse or doctor.          STOP taking these medications    LORazepam 1 MG tablet Commonly known as: ATIVAN   Ozempic (2 MG/DOSE) 8 MG/3ML Sopn Generic drug: Semaglutide (2 MG/DOSE)       TAKE these medications    Ajovy 225 MG/1.5ML Sosy Generic drug: Fremanezumab-vfrm   albuterol 108 (90 Base) MCG/ACT inhaler Commonly known as: VENTOLIN HFA ProAir HFA   BD Pen Needle Nano U/F 32G X 4 MM Misc Generic drug: Insulin Pen Needle   cholecalciferol 1000 units  tablet Commonly known as: VITAMIN D Take 1,000 Units by mouth 2 (two) times daily.   diphenhydrAMINE  50 MG capsule Commonly known as: BENADRYL  Take 1 tablet within 1 hour of the scan   EPINEPHrine  0.3 mg/0.3 mL Soaj injection Commonly known as: EPI-PEN EpiPen  2-Pak   estradiol  1 MG tablet Commonly known as: ESTRACE  Take 1 tablet (1 mg total) by mouth daily.   FreeStyle Libre 2 Sensor Misc by Does not apply route.   gabapentin 300 MG capsule Commonly known as: NEURONTIN Take 300 mg by mouth 4 (four) times daily.   levalbuterol 45 MCG/ACT inhaler Commonly known as: XOPENEX HFA Xopenex HFA   levocetirizine 5 MG tablet Commonly known as: XYZAL Take 5 mg by mouth every evening.   levothyroxine 75 MCG tablet Commonly known as: SYNTHROID Take 75 mcg by mouth at bedtime.   metFORMIN 500 MG tablet Commonly known as: GLUCOPHAGE Take 500 mg by mouth daily  with breakfast.   montelukast 10 MG tablet Commonly known as: SINGULAIR Take 10 mg by mouth at bedtime.   MULTIVITAMIN ADULT EXTRA C PO What changed: Another medication with the same name was removed. Continue taking this medication, and follow the directions you see here.   omeprazole 20 MG capsule Commonly known as: PRILOSEC Take 20 mg by mouth daily. At night.   ONE TOUCH ULTRA 2 w/Device Kit BY XX ROUTE AS DIRECTED   ONE TOUCH ULTRA TEST test strip Generic drug: glucose blood USE TO CHECK BLOOD SUGARS 2 TO 3 TIMES A DAY UTD FOR 90 DAYS   OneTouch Delica Lancets 33G Misc Apply 1 each topically 2 (two) times daily.   Probiotic 250 MG Caps Take by mouth.   Relpax 40 MG tablet Generic drug: eletriptan TK 1 T PO AT ONSET MAY REPEAT Q 2 H  NOT TO EXCEED 5 TS  D   rizatriptan 10 MG tablet Commonly known as: MAXALT TK 1 T PO AOS OF HEADACHE. MAY REPEAT IN 2 H IF NO IMPROVEMENT. MAXIMUM OF 30 MG PER DAY   thiothixene 1 MG capsule Commonly known as: NAVANE Take 2 mg by mouth 2 (two) times daily.   tiZANidine  2 MG tablet Commonly known as: ZANAFLEX Take 2 mg by mouth 3 (three) times daily. Take half tablet every 4 hours.   venlafaxine XR 150 MG 24 hr capsule Commonly known as: EFFEXOR-XR Effexor XR   VITAMIN B COMPLEX PO Take 1 tablet by mouth daily.   Vraylar 4.5 MG Caps Generic drug: Cariprazine HCl Take 1 capsule by mouth daily.        Allergies:  Allergies  Allergen Reactions   Albuterol Anaphylaxis   Aripiprazole Anaphylaxis   Azelastine Anaphylaxis   Budesonide Anaphylaxis   Budesonide-Formoterol Fumarate Anaphylaxis   Ciclesonide Anaphylaxis   Fluticasone Anaphylaxis   Fluticasone Furoate Anaphylaxis   Fluticasone-Salmeterol Anaphylaxis   Gadolinium Derivatives Anaphylaxis   Hydrocodone-Acetaminophen  Anaphylaxis   Hydroxyzine Anaphylaxis   Iodinated Contrast Media Anaphylaxis    Other reaction(s): Unknown   Iodine Anaphylaxis    And iodide containing products    Latex Anaphylaxis   Mometasone Anaphylaxis    Other Reaction(s): Angioedema   Other Anaphylaxis    Petrochemical products   Peanut-Containing Drug Products Anaphylaxis and Swelling   Penicillins Anaphylaxis   Petrolatum Anaphylaxis    Yellow    Petroleum Distillate Anaphylaxis and Rash    Other reaction(s): Angioedema  (plastics)  Other Reaction(s): Angioedema   Tamsulosin Anaphylaxis   Tdap [Tetanus-Diphth-Acell Pertussis] Anaphylaxis   Venlafaxine Other (See Comments)    Migraine, causes eye to close   Zinc Oxide Anaphylaxis   Albuterol Sulfate Other (See Comments)    Gi distress    Antihistamines, Chlorpheniramine-Type Other (See Comments)    Gi distress    Cetirizine & Related Other (See Comments)    Gi distress    Diphenhydramine      Other reaction(s): Other (See Comments), Other (See Comments) Doesn't work-can take Benadryl  Doesn't work-can take Benadryl     Duloxetine Other (See Comments)    Gi distress    Fexofenadine Other (See Comments)    Gi distress    Gluten Meal      Other reaction(s): Other (See Comments), Other (See Comments) Doesn't digest Doesn't digest    Haloperidol And Related Other (See Comments)    Gi distress    Ipratropium-Albuterol Other (See Comments)    Gi distress    Ipratropium-Albuterol Other (See Comments)  Levothyroxine Other (See Comments)    Gi distress    Levothyroxine Sodium Nausea And Vomiting   Metformin And Related Nausea And Vomiting   Milk (Cow)     Other reaction(s): Angioedema   Montelukast     Other reaction(s): Mental Status Changes (intolerance), Other (See Comments)   Montelukast Sodium Other (See Comments)    Gi distress    Omeprazole Nausea And Vomiting    Other Reaction(s): GI Intolerance  Can take Prilosec   Pineapple Other (See Comments)    Other reaction(s): Other (See Comments) unknown unknown    Red Dye #40 (Allura Red) Other (See Comments)    Other reaction(s): GI Upset (intolerance), Other (See Comments)  migraines  Other Reaction(s): GI Intolerance   Sitagliptin Nausea And Vomiting   Ziprasidone Hcl Other (See Comments)    Gi distress    Adhesive [Tape] Hives and Rash   Bupropion Anxiety    Family History: Family History  Problem Relation Age of Onset   Hyperthyroidism Mother    Arrhythmia Mother 31       s/p Pacermaker   Diabetes Father    Obesity Father    Cancer Sister        skin   Osteoporosis Maternal Grandmother    Hyperthyroidism Maternal Grandmother    Bipolar disorder Maternal Grandmother    Heart attack Maternal Grandfather 48   Diabetes Paternal Grandmother    Stroke Paternal Aunt    Breast cancer Paternal Aunt    Diabetes Paternal Aunt    Breast cancer Cousin    Diabetes Cousin    Bipolar disorder Cousin    Bladder Cancer Neg Hx    Prostate cancer Neg Hx    Kidney cancer Neg Hx    Ovarian cancer Neg Hx    Colon cancer Neg Hx    Heart disease Neg Hx     Social History: See HPI for pertinent social history  ROS: Pertinent ROS in  HPI  Physical Exam: BP 117/83 (BP Location: Left Arm, Patient Position: Sitting, Cuff Size: Normal)   Pulse 95   Ht 5' 3 (1.6 m)   Wt 220 lb (99.8 kg)   SpO2 94%   BMI 38.97 kg/m   Constitutional:  Well nourished. Alert and oriented, No acute distress. HEENT: [place] AT, moist mucus membranes.  Trachea midline Cardiovascular: No clubbing, cyanosis, or edema. Respiratory: Normal respiratory effort, no increased work of breathing. Neurologic: Grossly intact, no focal deficits, moving all 4 extremities. Psychiatric: Normal mood and affect.    Laboratory Data: See EPIC and HPI  I have reviewed the labs.   Pertinent Imaging: CLINICAL DATA:  Follow up renal lesion   EXAM: MRI ABDOMEN WITHOUT AND WITH CONTRAST   TECHNIQUE: Multiplanar multisequence MR imaging of the abdomen was performed both before and after the administration of intravenous contrast.   CONTRAST:  10mL GADAVIST  GADOBUTROL  1 MMOL/ML IV SOLN   COMPARISON:  MRI 04/08/2023 and older   FINDINGS: Lower chest: No pleural effusion identified along the lung bases.   Hepatobiliary: Diffuse stones dropout out of phase imaging in the liver consistent with fatty liver infiltration. No aggressive for space-occupying or hypervascular liver lesion identified at this time. Normal variant of an elongated left hepatic lobe lateral segment extending past the spleen. Patent portal vein. No biliary ductal dilatation. Gallbladder is mildly distended with some dependent stones. No wall thickening or abnormal enhancement.   Pancreas: Preserved pancreatic parenchyma and pancreatic duct. Normal precontrast T1 signal. No restricted diffusion  along the pancreas.   Spleen:  Normal in size.  Preserved enhancement and signal.   Adrenals/Urinary Tract: Left adrenal nodule identified which is homogeneous on T2 and has diffuse signal dropout consistent with an adenoma. Again diameter 14 mm. Right adrenal glands preserved. Right kidney has  some small bright T2, low T1 homogeneous nonenhancing Bosniak 1 cysts.   Left kidney also has some simple small Bosniak 1 cysts. However once again there are 2 dominant larger minimally complex cystic foci. The more caudal larger focus previously measured 7.8 x 7.7 cm and today when measured in a similar fashion 8.3 by 8.3 cm. There are some thin septations identified along the structure. No thick septations or enhancing nodules. The slightly smaller focus in the midportion of left kidney extending lateral previously measured 4.1 x 2.8 x 3.5 cm. Today on series 3, image 11 coronal dimension 3.7 cm. Axial plane dimension on series 4, image 21 measures 4.4 by 2.9 cm. Overall similar when adjusted for measurement variation. Again thin septations. No thick enhancing septations or nodules. Neither of these lesions demonstrate restricted diffusion.   No separate enhancing renal mass or collecting system dilatation.   Stomach/Bowel: Visualized portions of the small large bowel are nondilated. There is scattered colonic stool. Stomach is nondilated with some luminal debris.   Vascular/Lymphatic: Normal caliber aorta and IVC. No discrete abnormal lymph node enlargement identified in the upper abdomen.   Other:  No free fluid.   Musculoskeletal: Mild degenerative changes.   IMPRESSION: Stable mildly complex multi-septated cystic lesions in the left kidney. Again no clear aggressive features of thick enhancing septa or nodules. No restricted diffusion. Simple follow up surveillance may be useful in 1 year.   Gallstones.   Fatty liver infiltration.   Left adrenal adenoma     Electronically Signed   By: Ranell Bring M.D.   On: 04/07/2024 11:43 I have independently reviewed the films.  See HPI.   Assessment & Plan:    1.  Bosniak 87F cyst -So far, the Bosniak 87F renal cyst is  stable, but we will continue yearly monitoring - she will need an allergy prep for the MRI  2.   Interstitial cystitis - asymptomatic at today's visit  3. SUI - minimal bother   Return in about 1 year (around 04/28/2025) for repeat MRI report .  These notes generated with voice recognition software. I apologize for typographical errors.  CLOTILDA HELON RIGGERS  [facility] [address]  [address] [place], [place] [address] [phone]

## 2024-04-28 ENCOUNTER — Ambulatory Visit: Admitting: Urology

## 2024-04-28 VITALS — BP 117/83 | HR 95 | Ht 63.0 in | Wt 220.0 lb

## 2024-04-28 DIAGNOSIS — N3010 Interstitial cystitis (chronic) without hematuria: Secondary | ICD-10-CM | POA: Diagnosis not present

## 2024-04-28 DIAGNOSIS — N281 Cyst of kidney, acquired: Secondary | ICD-10-CM

## 2024-04-30 ENCOUNTER — Other Ambulatory Visit: Payer: Self-pay | Admitting: Urology

## 2024-04-30 ENCOUNTER — Encounter: Payer: Self-pay | Admitting: Urology

## 2024-04-30 NOTE — Progress Notes (Signed)
 Error

## 2024-05-03 DIAGNOSIS — M25561 Pain in right knee: Secondary | ICD-10-CM | POA: Diagnosis not present

## 2024-05-23 DIAGNOSIS — M25561 Pain in right knee: Secondary | ICD-10-CM | POA: Diagnosis not present

## 2024-05-25 DIAGNOSIS — E7800 Pure hypercholesterolemia, unspecified: Secondary | ICD-10-CM | POA: Diagnosis not present

## 2024-05-25 DIAGNOSIS — F319 Bipolar disorder, unspecified: Secondary | ICD-10-CM | POA: Diagnosis not present

## 2024-05-25 DIAGNOSIS — E119 Type 2 diabetes mellitus without complications: Secondary | ICD-10-CM | POA: Diagnosis not present

## 2024-05-25 DIAGNOSIS — E039 Hypothyroidism, unspecified: Secondary | ICD-10-CM | POA: Diagnosis not present

## 2024-05-28 ENCOUNTER — Other Ambulatory Visit: Payer: Self-pay | Admitting: Urology

## 2024-05-28 DIAGNOSIS — N2889 Other specified disorders of kidney and ureter: Secondary | ICD-10-CM

## 2024-05-30 DIAGNOSIS — M25561 Pain in right knee: Secondary | ICD-10-CM | POA: Diagnosis not present

## 2024-06-01 DIAGNOSIS — D0439 Carcinoma in situ of skin of other parts of face: Secondary | ICD-10-CM | POA: Diagnosis not present

## 2024-06-01 DIAGNOSIS — L821 Other seborrheic keratosis: Secondary | ICD-10-CM | POA: Diagnosis not present

## 2024-06-01 DIAGNOSIS — L218 Other seborrheic dermatitis: Secondary | ICD-10-CM | POA: Diagnosis not present

## 2024-06-01 DIAGNOSIS — D485 Neoplasm of uncertain behavior of skin: Secondary | ICD-10-CM | POA: Diagnosis not present

## 2024-06-14 DIAGNOSIS — M25561 Pain in right knee: Secondary | ICD-10-CM | POA: Diagnosis not present

## 2024-06-14 DIAGNOSIS — R600 Localized edema: Secondary | ICD-10-CM | POA: Diagnosis not present

## 2024-06-29 DIAGNOSIS — M797 Fibromyalgia: Secondary | ICD-10-CM | POA: Diagnosis not present

## 2024-06-29 DIAGNOSIS — Z6838 Body mass index (BMI) 38.0-38.9, adult: Secondary | ICD-10-CM | POA: Diagnosis not present

## 2024-06-29 DIAGNOSIS — M859 Disorder of bone density and structure, unspecified: Secondary | ICD-10-CM | POA: Diagnosis not present

## 2024-07-12 DIAGNOSIS — J4530 Mild persistent asthma, uncomplicated: Secondary | ICD-10-CM | POA: Diagnosis not present

## 2024-07-12 DIAGNOSIS — K760 Fatty (change of) liver, not elsewhere classified: Secondary | ICD-10-CM | POA: Diagnosis not present

## 2024-07-12 DIAGNOSIS — K219 Gastro-esophageal reflux disease without esophagitis: Secondary | ICD-10-CM | POA: Diagnosis not present

## 2024-07-12 DIAGNOSIS — J3089 Other allergic rhinitis: Secondary | ICD-10-CM | POA: Diagnosis not present

## 2024-07-12 DIAGNOSIS — F319 Bipolar disorder, unspecified: Secondary | ICD-10-CM | POA: Diagnosis not present

## 2024-07-12 DIAGNOSIS — G43109 Migraine with aura, not intractable, without status migrainosus: Secondary | ICD-10-CM | POA: Diagnosis not present

## 2024-07-12 DIAGNOSIS — E559 Vitamin D deficiency, unspecified: Secondary | ICD-10-CM | POA: Diagnosis not present

## 2024-07-12 DIAGNOSIS — E039 Hypothyroidism, unspecified: Secondary | ICD-10-CM | POA: Diagnosis not present

## 2024-07-12 DIAGNOSIS — E7800 Pure hypercholesterolemia, unspecified: Secondary | ICD-10-CM | POA: Diagnosis not present

## 2024-07-12 DIAGNOSIS — E119 Type 2 diabetes mellitus without complications: Secondary | ICD-10-CM | POA: Diagnosis not present

## 2024-07-12 DIAGNOSIS — N281 Cyst of kidney, acquired: Secondary | ICD-10-CM | POA: Diagnosis not present

## 2024-07-14 DIAGNOSIS — R600 Localized edema: Secondary | ICD-10-CM | POA: Diagnosis not present

## 2024-07-14 DIAGNOSIS — M25561 Pain in right knee: Secondary | ICD-10-CM | POA: Diagnosis not present

## 2024-07-17 DIAGNOSIS — F319 Bipolar disorder, unspecified: Secondary | ICD-10-CM | POA: Diagnosis not present

## 2024-07-17 DIAGNOSIS — F419 Anxiety disorder, unspecified: Secondary | ICD-10-CM | POA: Diagnosis not present

## 2024-07-19 DIAGNOSIS — D0439 Carcinoma in situ of skin of other parts of face: Secondary | ICD-10-CM | POA: Diagnosis not present

## 2025-04-09 ENCOUNTER — Other Ambulatory Visit

## 2025-04-27 ENCOUNTER — Ambulatory Visit: Admitting: Urology
